# Patient Record
Sex: Male | Born: 1940 | Race: White | Hispanic: No | Marital: Single | Smoking: Former smoker
Health system: Southern US, Community
[De-identification: ages and names within clinical notes are randomized; demographics above are authoritative.]

## PROBLEM LIST (undated history)

## (undated) DIAGNOSIS — N209 Urinary calculus, unspecified: Secondary | ICD-10-CM

## (undated) DIAGNOSIS — I10 Essential (primary) hypertension: Secondary | ICD-10-CM

## (undated) HISTORY — DX: Essential (primary) hypertension: I10

---

## 2004-11-24 ENCOUNTER — Emergency Department (HOSPITAL_COMMUNITY): Admission: EM | Admit: 2004-11-24 | Discharge: 2004-11-24 | Payer: Self-pay | Admitting: Family Medicine

## 2016-04-12 ENCOUNTER — Ambulatory Visit (INDEPENDENT_AMBULATORY_CARE_PROVIDER_SITE_OTHER): Payer: Medicare Other | Admitting: Family Medicine

## 2016-04-12 ENCOUNTER — Observation Stay (HOSPITAL_COMMUNITY)
Admission: EM | Admit: 2016-04-12 | Discharge: 2016-04-13 | Disposition: A | Discharge status: Home / Self Care | Payer: Medicare Other | Attending: Internal Medicine | Admitting: Internal Medicine

## 2016-04-12 ENCOUNTER — Emergency Department (HOSPITAL_COMMUNITY): Payer: Medicare Other

## 2016-04-12 ENCOUNTER — Encounter (HOSPITAL_COMMUNITY): Payer: Self-pay

## 2016-04-12 VITALS — BP 182/84 | HR 92 | Temp 98.7°F | Resp 16 | Ht 68.0 in | Wt 206.0 lb

## 2016-04-12 DIAGNOSIS — Z23 Encounter for immunization: Secondary | ICD-10-CM | POA: Insufficient documentation

## 2016-04-12 DIAGNOSIS — R0789 Other chest pain: Secondary | ICD-10-CM | POA: Diagnosis not present

## 2016-04-12 DIAGNOSIS — R519 Headache, unspecified: Secondary | ICD-10-CM

## 2016-04-12 DIAGNOSIS — R739 Hyperglycemia, unspecified: Secondary | ICD-10-CM | POA: Diagnosis present

## 2016-04-12 DIAGNOSIS — R072 Precordial pain: Secondary | ICD-10-CM

## 2016-04-12 DIAGNOSIS — R42 Dizziness and giddiness: Secondary | ICD-10-CM

## 2016-04-12 DIAGNOSIS — R51 Headache: Secondary | ICD-10-CM

## 2016-04-12 DIAGNOSIS — Z87891 Personal history of nicotine dependence: Secondary | ICD-10-CM | POA: Insufficient documentation

## 2016-04-12 DIAGNOSIS — R1013 Epigastric pain: Secondary | ICD-10-CM | POA: Diagnosis present

## 2016-04-12 DIAGNOSIS — I10 Essential (primary) hypertension: Secondary | ICD-10-CM | POA: Diagnosis present

## 2016-04-12 DIAGNOSIS — E785 Hyperlipidemia, unspecified: Secondary | ICD-10-CM

## 2016-04-12 DIAGNOSIS — R079 Chest pain, unspecified: Principal | ICD-10-CM | POA: Diagnosis present

## 2016-04-12 HISTORY — DX: Urinary calculus, unspecified: N20.9

## 2016-04-12 LAB — CBC WITH DIFFERENTIAL/PLATELET
BASOS PCT: 1 %
Basophils Absolute: 0.1 10*3/uL (ref 0.0–0.1)
EOS ABS: 0.1 10*3/uL (ref 0.0–0.7)
Eosinophils Relative: 1 %
HCT: 47.6 % (ref 39.0–52.0)
HEMOGLOBIN: 16.1 g/dL (ref 13.0–17.0)
LYMPHS ABS: 1.4 10*3/uL (ref 0.7–4.0)
LYMPHS PCT: 14 %
MCH: 28.6 pg (ref 26.0–34.0)
MCHC: 33.8 g/dL (ref 30.0–36.0)
MCV: 84.7 fL (ref 78.0–100.0)
Monocytes Absolute: 0.5 10*3/uL (ref 0.1–1.0)
Monocytes Relative: 5 %
NEUTROS ABS: 8.2 10*3/uL — AB (ref 1.7–7.7)
Neutrophils Relative %: 79 %
Platelets: 230 10*3/uL (ref 150–400)
RBC: 5.62 MIL/uL (ref 4.22–5.81)
RDW: 14.1 % (ref 11.5–15.5)
WBC: 10.3 10*3/uL (ref 4.0–10.5)

## 2016-04-12 LAB — BASIC METABOLIC PANEL
Anion gap: 10 (ref 5–15)
BUN: 16 mg/dL (ref 6–20)
CHLORIDE: 107 mmol/L (ref 101–111)
CO2: 24 mmol/L (ref 22–32)
CREATININE: 1.06 mg/dL (ref 0.61–1.24)
Calcium: 9.7 mg/dL (ref 8.9–10.3)
GFR calc Af Amer: >60 mL/min (ref >60)
GFR calc non Af Amer: >60 mL/min (ref >60)
Glucose, Bld: 130 mg/dL — ABNORMAL HIGH (ref 65–99)
Potassium: 4.2 mmol/L (ref 3.5–5.1)
SODIUM: 141 mmol/L (ref 135–145)

## 2016-04-12 LAB — I-STAT TROPONIN, ED: TROPONIN I, POC: 0 ng/mL (ref 0.00–0.08)

## 2016-04-12 LAB — TROPONIN I: Troponin I: <0.03 ng/mL (ref <0.03)

## 2016-04-12 MED ORDER — INFLUENZA VAC SPLIT QUAD 0.5 ML IM SUSY
0.5000 mL | PREFILLED_SYRINGE | INTRAMUSCULAR | Status: AC
Start: 2016-04-13 — End: 2016-04-13
  Administered 2016-04-13: 0.5 mL via INTRAMUSCULAR
  Filled 2016-04-12: qty 0.5

## 2016-04-12 MED ORDER — ENOXAPARIN SODIUM 40 MG/0.4ML ~~LOC~~ SOLN
40.0000 mg | SUBCUTANEOUS | Status: DC
  Administered 2016-04-12: 40 mg via SUBCUTANEOUS
  Filled 2016-04-12: qty 0.4

## 2016-04-12 MED ORDER — ASPIRIN EC 81 MG PO TBEC
81.0000 mg | DELAYED_RELEASE_TABLET | Freq: Every day | ORAL | Status: DC
  Administered 2016-04-13: 81 mg via ORAL
  Filled 2016-04-12: qty 1

## 2016-04-12 MED ORDER — ACETAMINOPHEN 325 MG PO TABS: 650.0000 mg | ORAL_TABLET | Freq: Four times a day (QID) | ORAL | Status: DC | PRN

## 2016-04-12 MED ORDER — SODIUM CHLORIDE 0.9% FLUSH
3.0000 mL | Freq: Two times a day (BID) | INTRAVENOUS | Status: DC
  Administered 2016-04-12: 3 mL via INTRAVENOUS

## 2016-04-12 MED ORDER — ASPIRIN 81 MG PO CHEW: 324.0000 mg | CHEWABLE_TABLET | Freq: Once | ORAL | Status: DC

## 2016-04-12 MED ORDER — ONDANSETRON HCL 4 MG PO TABS
4.0000 mg | ORAL_TABLET | Freq: Four times a day (QID) | ORAL | Status: DC | PRN
Start: 2016-04-12 — End: 2016-04-13

## 2016-04-12 MED ORDER — PNEUMOCOCCAL VAC POLYVALENT 25 MCG/0.5ML IJ INJ
0.5000 mL | INJECTION | INTRAMUSCULAR | Status: AC
  Administered 2016-04-13: 0.5 mL via INTRAMUSCULAR
  Filled 2016-04-12: qty 0.5

## 2016-04-12 MED ORDER — PANTOPRAZOLE SODIUM 40 MG PO TBEC
40.0000 mg | DELAYED_RELEASE_TABLET | Freq: Every day | ORAL | Status: DC
  Administered 2016-04-12 – 2016-04-13 (×2): 40 mg via ORAL
  Filled 2016-04-12 (×2): qty 1

## 2016-04-12 MED ORDER — ONDANSETRON HCL 4 MG/2ML IJ SOLN: 4.0000 mg | Freq: Four times a day (QID) | INTRAMUSCULAR | Status: DC | PRN

## 2016-04-12 MED ORDER — LISINOPRIL 5 MG PO TABS
5.0000 mg | ORAL_TABLET | Freq: Every day | ORAL | Status: DC
  Administered 2016-04-12 – 2016-04-13 (×2): 5 mg via ORAL
  Filled 2016-04-12 (×2): qty 1

## 2016-04-12 NOTE — ED Triage Notes (Signed)
Pt presents to ed from urgent care for chest pain x 2 days radiating into his left arm that is worse when he sleeps on his left side, denies any other symptoms, patient denies pain at this time and recievied 324 of asa pta

## 2016-04-12 NOTE — Progress Notes (Addendum)
Subjective:    Patient ID: Gabriel Wu, male    DOB: [birth year], 75 y.o.   MRN: [id number] Chief Complaint  Patient presents with  . Dizziness  . Chest Pain    HPI Gabriel Wu is a 75 y.o. male I was pulled from patient care to see patient acutely due to presenting symptoms of chest pain and dizziness along with elevated blood pressure. Patient was brought back immediately to room 6  Patient presents with a left-sided chest pain for the past 3 days, with one dizzy spell this morning. Left-sided chest pain radiates to the shoulder and upper arm, lasts a few minutes at a time, waxing and waning for the past 3 days. No current pain now, but on and off for days. Pain is at 3 out of 10 intensity at its worst, currently 0 out of 10 as no current chest pain. His pain improves with rubbing over the muscle as it goes away within a few minutes. Denies any associated nausea/vomiting,  shortness of breath, or any diaphoresis. No known history of heart disease. Denies history of DVT/PE, denies history of calf pain, swelling, or recent air or car travel.  Also admits to dizziness this morning that was noted more with head movement or getting up and moving. Denies any syncope. Denies any focal weakness in arms legs face, denies slurred speech or facial droop.  Past medical history of kidney stone approximately 4 years ago and was advised that his blood pressure was borderline high at that time but no medications were given. He does not have a primary care provider and no recent medical care in the past 4 years.    Social history: Tobacco use approximately 50 years of cigar use, quit 5 years ago.  Review of Systems  Eyes: Negative for visual disturbance.  Respiratory: Negative for cough and shortness of breath.   Cardiovascular: Positive for chest pain. Negative for leg swelling.  Gastrointestinal: Negative for nausea and vomiting.  Musculoskeletal: Positive for arthralgias (l shoulder  to upper arm from chest. ). Negative for joint swelling.  Skin: Negative for rash.  Neurological: Positive for dizziness and headaches (Slight right-sided occipital headache today.). Negative for syncope, facial asymmetry and weakness.       Objective:   Physical Exam  Constitutional: He is oriented to person, place, and time. He appears well-developed and well-nourished.  HENT:  Head: Normocephalic and atraumatic.  Eyes: EOM are normal. Pupils are equal, round, and reactive to light.  Neck: No JVD present. Carotid bruit is not present.  Cardiovascular: Normal rate, regular rhythm and normal heart sounds.   No murmur heard. Pulmonary/Chest: Effort normal and breath sounds normal. He has no rales.  Unable to reproduce pain with palpation along chest wall or shoulder.  Musculoskeletal: He exhibits no edema.       Right lower leg: He exhibits no tenderness and no swelling.       Left lower leg: He exhibits no tenderness and no swelling.  Slight discomfort at the dorsum of his left shoulder with shoulder abduction, but does not reproduce chest pain or deeper pain he has been experiencing the past few days.   Neurological: He is alert and oriented to person, place, and time.  No focal weakness appreciated. Speaking in normal sentences. Able to repeat phrase "you can't teach an old dog new tricks" without difficulty. No pronator drift, negative Romberg.  Skin: Skin is warm and dry.  Psychiatric: He has a normal mood and affect. His  behavior is normal.  Vitals reviewed.  Vitals:   04/12/16 1433 04/12/16 1442  BP: (!) 180/104 (!) 182/84  Pulse: 92   Resp: 16   Temp: 98.7 F (37.1 C)   Weight: 206 lb (93.4 kg)   Height: 5\' 8"  (1.727 m)    EKG: sinus rhythm, T-wave inversion in leads 3 and aVF. No acute findings otherwise, no prior EKG available for comparison.  2:44 PM IV placed 20-gauge left forearm running normal saline. EMS called for transport. Placed on cardiac monitor.   3:06  PM [facility]. Report given to EMS with transfer of care.     Assessment & Plan:  Gabriel Wu is a 75 y.o. male Precordial pain - Plan: EKG 12-Lead  Chest pain, unspecified chest pain type  Dizziness  Occipital headache  Three-day history of left-sided chest pain with radiation to left shoulder and upper arm. Occipital headache this morning, as well as episode of dizziness this morning without syncope. No other focal neurologic deficits or symptoms. Some persistent occipital headache on the right today.  -  Denies any chest pain in office, borderline abnormal EKG, but with elevated blood pressure and description of symptoms, concerning for possible cardiac source/unstable angina.   -IV placed for access, cardiac monitor placed. Aspirin was not given due to occipital headache, hypertension, and dizziness earlier today - less likely, but hemorrhagic CVA in differential with HA and HTN. EMS called for transport with [facility]. Timing of interventions as above.     No orders of the defined types were placed in this encounter.  Patient Instructions   If follow-up after emergency room visit, return here or other medical provider.      IF you received an x-ray today, you will receive an invoice from [facility]. Please contact [facility] at [phone] with questions or concerns regarding your invoice.   IF you received labwork today, you will receive an invoice from [facility]. Please contact [facility] at [phone] with questions or concerns regarding your invoice.   Our billing staff will not be able to assist you with questions regarding bills from these companies.  You will be contacted with the lab results as soon as they are available. The fastest way to get your results is to activate your My Chart account. Instructions are located on the last page of  this paperwork. If you have not heard from us regarding the results in 2 weeks, please contact this office.       Signed,   Meredith StaggersJeffrey Levi Crass, MD [facility].  04/13/16 11:16 PM

## 2016-04-12 NOTE — ED Provider Notes (Signed)
[facility]-EMERGENCY DEPT Provider Note   CSN: [id number] Arrival date & time: 04/12/16  1552     History   Chief Complaint Chief Complaint  Patient presents with  . Chest Pain    HPI Gabriel Wu is a 75 y.o. male.  HPI  [id number] year old male who presents with chest pain. He has no known medical history as he has not seen a primary care doctor for several years. States onset of intermittent chest pain over the past 3 days. Prior to this he has not had prior history of chest pain or cardiac workup. States that pain typically comes on in the morning when he wakes up from sleep. It lasts for 10 minutes before self resolving. He describes it over the left side of his chest and left shoulder that is nonradiating, aching and throbbing in nature. 3/10 in severity at the worst. No alleviating or aggravating factors. It is not reproducible with palpation or range of motion of the shoulder. No falls, exertional activity recently. Pain not worsened by activity. He does not have associating leg swelling or leg pain, syncope, back pain, nausea or vomiting, abdominal pain, fevers or chills, cough, difficulty breathing, or diaphoresis. This morning he felt like his blood pressure may been elevated as with bending over and standing up he felt very dizzy. He was seen in the doctor's office and noted to have elevated blood pressure and sent to the ED for evaluation. Currently he denies any chest pain. Denies tobacco use. Father died of MI in his 240s.  History reviewed. No pertinent past medical history.  There are no active problems to display for this patient.   History reviewed. No pertinent surgical history.  OB History    No data available       Home Medications    Prior to Admission medications   Not on File    Family History No family history on file. Father died of MI in his 5440s.   Social History Social History  Substance Use Topics  . Smoking status: Former Smoker    Years: 50.00      Types: Cigars  . Smokeless tobacco: Never Used  . Alcohol use Not on file     Allergies   Review of patient's allergies indicates no known allergies.   Review of Systems Review of Systems 10/14 systems reviewed and are negative other than those stated in the HPI   Physical Exam Updated Vital Signs BP 180/81   Pulse 75   Temp 98.2 F (36.8 C) (Oral)   Resp 19   Ht 5\' 9"  (1.753 m)   Wt 195 lb (88.5 kg)   SpO2 97%   BMI 28.80 kg/m   Physical Exam Physical Exam  Nursing note and vitals reviewed. Constitutional: Well developed, well nourished, non-toxic, and in no acute distress Head: Normocephalic and atraumatic.  Mouth/Throat: Oropharynx is clear and moist.  Neck: Normal range of motion. Neck supple.  Eyes: conjunctiva normal Cardiovascular: Normal rate and regular rhythm.  no edema Pulmonary/Chest: Effort normal and breath sounds normal. No chest wall tenderness.  Abdominal: Soft. There is no tenderness. There is no rebound and no guarding.  Musculoskeletal: Normal range of motion.  Neurological: Alert, no facial droop, fluent speech, moves all extremities symmetrically Skin: Skin is warm and dry.  Psychiatric: Cooperative   ED Treatments / Results  Labs (all labs ordered are listed, but only abnormal results are displayed) Labs Reviewed  CBC WITH DIFFERENTIAL/PLATELET - Abnormal; Notable for the following:  Result Value   Neutro Abs 8.2 (*)    All other components within normal limits  BASIC METABOLIC PANEL - Abnormal; Notable for the following:    Glucose, Bld 130 (*)    All other components within normal limits  I-STAT TROPOININ, ED    EKG  EKG Interpretation  Date/Time:  Saturday April 12 2016 15:53:39 EDT Ventricular Rate:  76 PR Interval:    QRS Duration: 93 QT Interval:  379 QTC Calculation: 427 R Axis:   39 Text Interpretation:  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 EXAM: CHEST  2 VIEW COMPARISON:  None. FINDINGS: Lateral view degraded by patient arm position. Midline trachea. Normal heart size and mediastinal contours. No pleural effusion or pneumothorax. Clear lungs. IMPRESSION: No acute cardiopulmonary disease. Electronically Signed   By: Jeronimo Greaves M.D.   On: 04/12/2016 18:27    Procedures Procedures (including critical care time)  Medications Ordered in ED Medications  aspirin chewable tablet 324 mg (324 mg Oral Not Given 04/12/16 1638)     Initial Impression / Assessment and Plan / ED Course  I have reviewed the triage vital signs and the nursing notes.  Pertinent labs & imaging results that were available during my care of the patient were reviewed by me and considered in my medical decision making (see chart for details).  Clinical Course    75 year old male who presents with 3 days of intermittent left shoulder and left chest pain that is not reproducible. Borderline low to medium risk, with likely hypertension and then history of age, obesity, and family history as risk factors. Has not had a primary care follow-up for very long time so other risk factors are unknown at this time. He has an chest pain-free here in the emergency department. Chest x-ray without acute cardiopulmonary processes. Troponin 1 is negative and EKG without acute ischemic changes. Still concern for potential ACS. History and presentation unlikely to be PE, dissection, GI cause, or other cardiopulmonary or intrathoracic etiology. We'll plan to admit for observation for chest pain rule out.  Final Clinical Impressions(s) / ED Diagnoses   Final diagnoses:  Nonspecific chest pain    New Prescriptions New Prescriptions   No medications on file     Lavera Guise,  MD 04/12/16 [place]

## 2016-04-12 NOTE — H&P (Signed)
History and Physical    Gabriel Wu RUE:[id number] DOB: 12-Sep-1940 DOA: 04/12/2016  PCP: No primary care provider on file.   [facility]: [facility]  Chief Complaint: Chest pain.  HPI: Gabriel Wu is a 75 y.o. male with medical history significant of urolithiasis was coming to the hospital from [facility] complaining of chest pain since yesterday.  Per patient, he developed a throbbing left sided CP, radiated to his left shoulder associated with dizziness. He denies dyspnea, palpitations, nausea, emesis, PND, orthopnea or pitting edema. He states that in one of the episodes he went outside the house and the pain felt better. He says that the pain has happened on/off more than five times since yesterday.  He went to the urgent care facility this morning since the pain has persisted and was referred to the ED. He denies CP at this time.   ED Course: The patient received aspirin. EKG is sinus rhythm. First troponin level was negative. He is CP free now.  Review of Systems: As per HPI otherwise 10 point review of systems negative.    Past Medical History:  Diagnosis Date  . Urolithiasis    Past stone spontaneously per patient.    History reviewed. No pertinent surgical history.   reports that he quit smoking about 5 years ago. His smoking use included Cigars. He quit after 50.00 years of use. He has never used smokeless tobacco. He reports that he does not drink alcohol or use drugs.  No Known Allergies  Family History  Problem Relation Age of Onset  . Leukemia Mother   . Heart attack Father   . Stomach cancer Sister     Prior to Admission medications   Medication Sig Start Date End Date Taking? Authorizing Provider  aspirin 325 MG tablet Take 325 mg by mouth every 6 (six) hours as needed.   Yes Historical Provider, MD  omeprazole (PRILOSEC OTC) 20 MG tablet Take 20 mg by mouth every other day.   Yes Historical  Provider, MD    Physical Exam:  Constitutional: NAD, calm, comfortable Vitals:   04/12/16 1700 04/12/16 1715 04/12/16 1743 04/12/16 1800  BP: 161/86 170/87 180/81 163/85  Pulse: 68 72 75 70  Resp: 16 13 19 13   Temp:      TempSrc:      SpO2: 98% 99% 97% 98%  Weight:      Height:       Eyes: PERRL, lids and conjunctivae normal ENMT: Mucous membranes are moist. Posterior pharynx clear of any exudate or lesions. Dentures.  Neck: normal, supple, no masses, no thyromegaly Respiratory: clear to auscultation bilaterally, no wheezing, no crackles. Normal respiratory effort. No accessory muscle use.  Cardiovascular: Regular rate and rhythm, no murmurs / rubs / gallops. No extremity edema. 2+ pedal pulses. No carotid bruits.  Abdomen: Bowel sounds positive. Soft, no tenderness, no masses palpated. No hepatosplenomegaly.  Musculoskeletal: no clubbing / cyanosis. No joint deformity upper and lower extremities. Good ROM, no contractures. Normal muscle tone.  Skin: no rashes, lesions, ulcers. No induration Neurologic: CN 2-12 grossly intact. Sensation intact, DTR normal. Strength 5/5 in all 4.  Psychiatric: Normal judgment and insight. Alert and oriented x 4. Normal mood.    Labs on Admission: I have personally reviewed following labs and imaging studies  CBC:  Recent Labs Lab 04/12/16 1639  WBC 10.3  NEUTROABS 8.2*  HGB 16.1  HCT 47.6  MCV 84.7  PLT 230  Basic Metabolic Panel:  Recent Labs Lab 04/12/16 1639  NA 141  K 4.2  CL 107  CO2 24  GLUCOSE 130*  BUN 16  CREATININE 1.06  CALCIUM 9.7   GFR: Estimated Creatinine Clearance (by C-G formula based on SCr of 1.06 mg/dL) Male: 81.155.2 mL/min Male: 67.3 mL/min Liver Function Tests: No results for input(s): AST, ALT, ALKPHOS, BILITOT, PROT, ALBUMIN in the last 168 hours. No results for input(s): LIPASE, AMYLASE in the last 168 hours. No results for input(s): AMMONIA in the last 168 hours. Coagulation Profile: No  results for input(s): INR, PROTIME in the last 168 hours. Cardiac Enzymes: No results for input(s): CKTOTAL, CKMB, CKMBINDEX, TROPONINI in the last 168 hours. BNP (last 3 results) No results for input(s): PROBNP in the last 8760 hours. HbA1C: No results for input(s): HGBA1C in the last 72 hours. CBG: No results for input(s): GLUCAP in the last 168 hours. Lipid Profile: No results for input(s): CHOL, HDL, LDLCALC, TRIG, CHOLHDL, LDLDIRECT in the last 72 hours. Thyroid Function Tests: No results for input(s): TSH, T4TOTAL, FREET4, T3FREE, THYROIDAB in the last 72 hours. Anemia Panel: No results for input(s): VITAMINB12, FOLATE, FERRITIN, TIBC, IRON, RETICCTPCT in the last 72 hours. Urine analysis: No results found for: COLORURINE, APPEARANCEUR, LABSPEC, PHURINE, GLUCOSEU, HGBUR, BILIRUBINUR, KETONESUR, PROTEINUR, UROBILINOGEN, NITRITE, LEUKOCYTESUR  Radiological Exams on Admission: Dg Chest 2 View  Result Date: 04/12/2016 CLINICAL DATA:  Chest pain, left shoulder pain, and dizziness for the past few days but worse today. EXAM: CHEST  2 VIEW COMPARISON:  None. FINDINGS: Lateral view degraded by patient arm position. Midline trachea. Normal heart size and mediastinal contours. No pleural effusion or pneumothorax. Clear lungs. IMPRESSION: No acute cardiopulmonary disease. Electronically Signed   By: Jeronimo GreavesKyle  Talbot M.D.   On: 04/12/2016 18:27    EKG: Independently reviewed. Vent. rate 76 BPM PR interval * ms QRS duration 93 ms QT/QTc 379/427 ms P-R-T axes * 39 -3 Sinus rhythm.  Assessment/Plan Principal Problem:   Chest pain Admit to telemetry unit/observation. Cardiac monitoring. Supplemental oxygen as needed. Continue aspirin. Check fasting lipids and glucose in a.m. Check echocardiogram in the morning.  Active Problems:   Elevated blood pressure Will continue to observe, but will likely need antihypertensive therapy. Addendun: Started lisinopril 5 mg po QD.  Follow up BP,  BUN/creatinine and electrolytes. Needs to establish with PCP.    Hyperglycemia No polyuria, no polydipsia or position changes per patient. Check fasting glucose in the morning.    DVT prophylaxis: Lovenox SQ. Code Status: Full code. Family Communication: His son Gabriel Wu was present in the room. Disposition Plan: Admit for overnight observation/chest pain. Consults called:  Admission status: Observation/telemetry.   Bobette Moavid Manuel Deaven Barron MD [facility] Pager [phone].  If 7PM-7AM, please contact night-coverage [email or web address] Password [email or web address]  04/12/2016, 7:58 PM

## 2016-04-12 NOTE — Patient Instructions (Addendum)
If follow-up after emergency room visit, return here or other medical provider.      IF you received an x-ray today, you will receive an invoice from [facility]. Please contact [facility] at [phone] with questions or concerns regarding your invoice.   IF you received labwork today, you will receive an invoice from [facility]. Please contact [facility] at [phone] with questions or concerns regarding your invoice.   Our billing staff will not be able to assist you with questions regarding bills from these companies.  You will be contacted with the lab results as soon as they are available. The fastest way to get your results is to activate your My Chart account. Instructions are located on the last page of this paperwork. If you have not heard from us regarding the results in 2 weeks, please contact this office.

## 2016-04-13 ENCOUNTER — Observation Stay (HOSPITAL_COMMUNITY): Payer: Medicare Other

## 2016-04-13 ENCOUNTER — Observation Stay (HOSPITAL_BASED_OUTPATIENT_CLINIC_OR_DEPARTMENT_OTHER): Payer: Medicare Other

## 2016-04-13 DIAGNOSIS — R079 Chest pain, unspecified: Secondary | ICD-10-CM

## 2016-04-13 DIAGNOSIS — R0789 Other chest pain: Secondary | ICD-10-CM | POA: Diagnosis not present

## 2016-04-13 DIAGNOSIS — E785 Hyperlipidemia, unspecified: Secondary | ICD-10-CM | POA: Diagnosis not present

## 2016-04-13 DIAGNOSIS — R030 Elevated blood-pressure reading, without diagnosis of hypertension: Secondary | ICD-10-CM

## 2016-04-13 LAB — COMPREHENSIVE METABOLIC PANEL
ALT: 18 U/L (ref 17–63)
ANION GAP: 8 (ref 5–15)
AST: 18 U/L (ref 15–41)
Albumin: 3.7 g/dL (ref 3.5–5.0)
Alkaline Phosphatase: 52 U/L (ref 38–126)
BUN: 17 mg/dL (ref 6–20)
CHLORIDE: 107 mmol/L (ref 101–111)
CO2: 25 mmol/L (ref 22–32)
Calcium: 9.3 mg/dL (ref 8.9–10.3)
Creatinine, Ser: 1.01 mg/dL (ref 0.61–1.24)
GFR calc non Af Amer: >60 mL/min (ref >60)
Glucose, Bld: 138 mg/dL — ABNORMAL HIGH (ref 65–99)
POTASSIUM: 3.6 mmol/L (ref 3.5–5.1)
SODIUM: 140 mmol/L (ref 135–145)
Total Bilirubin: 0.5 mg/dL (ref 0.3–1.2)
Total Protein: 6.8 g/dL (ref 6.5–8.1)

## 2016-04-13 LAB — LIPID PANEL
CHOL/HDL RATIO: 4.4 ratio
CHOLESTEROL: 160 mg/dL (ref 0–200)
HDL: 36 mg/dL — AB (ref >40)
LDL Cholesterol: 91 mg/dL (ref 0–99)
TRIGLYCERIDES: 165 mg/dL — AB (ref <150)
VLDL: 33 mg/dL (ref 0–40)

## 2016-04-13 LAB — NM MYOCAR MULTI W/SPECT W/WALL MOTION / EF
CHL CUP MPHR: 146 {beats}/min
CSEPED: 0 min
CSEPEW: 1 METS
Exercise duration (sec): 0 s
Peak HR: 96 {beats}/min
Percent HR: 65 %
Rest HR: 71 {beats}/min

## 2016-04-13 LAB — TROPONIN I

## 2016-04-13 MED ORDER — REGADENOSON 0.4 MG/5ML IV SOLN
0.4000 mg | Freq: Once | INTRAVENOUS | Status: AC
  Administered 2016-04-13: 0.4 mg via INTRAVENOUS
  Filled 2016-04-13: qty 5

## 2016-04-13 MED ORDER — LISINOPRIL 5 MG PO TABS: 5.0000 mg | ORAL_TABLET | Freq: Every day | ORAL | 0 refills | Status: DC

## 2016-04-13 MED ORDER — TECHNETIUM TC 99M TETROFOSMIN IV KIT
30.0000 | PACK | Freq: Once | INTRAVENOUS | Status: AC | PRN
  Administered 2016-04-13: 30 via INTRAVENOUS

## 2016-04-13 MED ORDER — ASPIRIN 81 MG PO TBEC: 81.0000 mg | DELAYED_RELEASE_TABLET | Freq: Every day | ORAL | Status: DC

## 2016-04-13 MED ORDER — ATORVASTATIN CALCIUM 40 MG PO TABS
40.0000 mg | ORAL_TABLET | Freq: Every day | ORAL | 0 refills | Status: DC
Start: 2016-04-13 — End: 2016-05-12

## 2016-04-13 MED ORDER — ATORVASTATIN CALCIUM 40 MG PO TABS: 40.0000 mg | ORAL_TABLET | Freq: Every day | ORAL | Status: DC

## 2016-04-13 MED ORDER — TECHNETIUM TC 99M TETROFOSMIN IV KIT
10.0000 | PACK | Freq: Once | INTRAVENOUS | Status: AC | PRN
  Administered 2016-04-13: 10 via INTRAVENOUS

## 2016-04-13 MED ORDER — REGADENOSON 0.4 MG/5ML IV SOLN
INTRAVENOUS | Status: AC
  Filled 2016-04-13: qty 5

## 2016-04-13 NOTE — Progress Notes (Addendum)
  1-day Nuclear Stress Test performed, images and final report pending. Being read by [facility].  Signed, Ellsworth LennoxBrittany M Latoya Maulding, PA-C 04/13/2016, 11:27 AM Pager: [phone]   Patient's NST showed:  IMPRESSION: 1. No reversible ischemia or infarction.  2. Normal left ventricular wall motion.  3. Left ventricular ejection fraction 65%  4. Non invasive risk stratification*: Low  Patient and admitting team are aware of results. No further inpatient cardiac workup indicated at this time.  Signed, Ellsworth LennoxBrittany M Xandra Laramee, PA-C 04/13/2016, 2:39 PM Pager: [phone]

## 2016-04-13 NOTE — Consult Note (Signed)
Cardiology Consult    Patient ID: Gabriel Wu MRN: [id number], DOB/AGE: 1941-03-13   Admit date: 04/12/2016 Date of Consult: 04/13/2016  Primary Physician: No primary care provider on file. Reason for Consult: Chest pain  Primary Cardiologist: New Requesting Provider: Dr. Rennis PettyGhirmire  Patient Profile  Gabriel Wu is a 75 year old male with no significant cardiac history. He presented to the ED on 04/12/16 with chest pain.  History of Present Illness  Gabriel Wu was sitting in his home on Thursday and developed substernal left sided chest pain. The pain radiated to his left shoulder. He denies associated symptoms. No SOB, nausea or diaphoresis. He says that the pain got better when he rubbed his chest.  On Saturday morning 04/12/16 he developed the same chest pain that was associated with some diaphoresis. The pain was slightly more severe this time. He has never had any pain like this before. He went to urgent care and then was [facility].   He is a retired Personnel officerelectrician. He is still moderately active at home and mows his grass. He denies chest pain with any activity outside.   He does have a family history of MI, his father died of MI at the age of 75. His EKG shows some T wave inversion in his inferior leads. No prior EKG to compare to.   Past Medical History   Past Medical History:  Diagnosis Date  . Urolithiasis    Past stone spontaneously per patient.    History reviewed. No pertinent surgical history.   Allergies  No Known Allergies  Inpatient Medications    . aspirin  324 mg Oral Once  . aspirin EC  81 mg Oral Daily  . enoxaparin (LOVENOX) injection  40 mg Subcutaneous Q24H  . Influenza vac split quadrivalent PF  0.5 mL Intramuscular Tomorrow-1000  . lisinopril  5 mg Oral Daily  . pantoprazole  40 mg Oral Daily  . pneumococcal 23 valent vaccine  0.5 mL Intramuscular Tomorrow-1000  . sodium chloride flush  3 mL Intravenous Q12H    Family  History    Family History  Problem Relation Age of Onset  . Leukemia Mother   . Heart attack Father   . Stomach cancer Sister     Social History    Social History   Social History  . Marital status: Single    Spouse name: N/A  . Number of children: N/A  . Years of education: N/A   Occupational History  . Not on file.   Social History Main Topics  . Smoking status: Former Smoker    Years: 50.00    Types: Cigars    Quit date: 07/21/2010  . Smokeless tobacco: Never Used  . Alcohol use No  . Drug use: No  . Sexual activity: Not on file   Other Topics Concern  . Not on file   Social History Narrative  . No narrative on file     Review of Systems    General:  No chills, fever, night sweats or weight changes.  Cardiovascular:  + chest pain, dyspnea on exertion, edema, orthopnea, palpitations, paroxysmal nocturnal dyspnea. Dermatological: No rash, lesions/masses Respiratory: No cough, dyspnea Urologic: No hematuria, dysuria Abdominal:   No nausea, vomiting, diarrhea, bright red blood per rectum, melena, or hematemesis Neurologic:  No visual changes, wkns, changes in mental status. All other systems reviewed and are otherwise negative except as noted above.  Physical Exam    Blood pressure 127/78, pulse Marland Kitchen(!)  57, temperature 98.4 F (36.9 C), temperature source Oral, resp. rate 16, height 5\' 9"  (1.753 m), weight 207 lb 11.2 oz (94.2 kg), SpO2 98 %.  General: Pleasant, NAD Psych: Normal affect. Neuro: Alert and oriented X 3. Moves all extremities spontaneously. HEENT: Normal  Neck: Supple without bruits or JVD. Lungs:  Resp regular and unlabored, CTA. Heart: RRR no s3, s4, or murmurs. Abdomen: Soft, non-tender, non-distended, BS + x 4.  Extremities: No clubbing, cyanosis or edema. DP/PT/Radials 2+ and equal bilaterally.  Labs    Troponin [facility] of Care Test)  Recent Labs  04/12/16 1646  TROPIPOC 0.00    Recent Labs  04/12/16 2310 04/13/16 0438    TROPONINI <0.03 <0.03   Lab Results  Component Value Date   WBC 10.3 04/12/2016   HGB 16.1 04/12/2016   HCT 47.6 04/12/2016   MCV 84.7 04/12/2016   PLT 230 04/12/2016    Recent Labs Lab 04/13/16 0438  NA 140  K 3.6  CL 107  CO2 25  BUN 17  CREATININE 1.01  CALCIUM 9.3  PROT 6.8  BILITOT 0.5  ALKPHOS 52  ALT 18  AST 18  GLUCOSE 138*   Lab Results  Component Value Date   CHOL 160 04/13/2016   HDL 36 (L) 04/13/2016   LDLCALC 91 04/13/2016   TRIG 165 (H) 04/13/2016   No results found for: [facility]   Radiology Studies    Dg Chest 2 View  Result Date: 04/12/2016 CLINICAL DATA:  Chest pain, left shoulder pain, and dizziness for the past few days but worse today. EXAM: CHEST  2 VIEW COMPARISON:  None. FINDINGS: Lateral view degraded by patient arm position. Midline trachea. Normal heart size and mediastinal contours. No pleural effusion or pneumothorax. Clear lungs. IMPRESSION: No acute cardiopulmonary disease. Electronically Signed   By: Jeronimo Greaves M.D.   On: 04/12/2016 18:27    EKG & Cardiac Imaging    EKG: NSR, some non specific ST abnormalities, T wave inversion in inferior leads.     Assessment & Plan  1. Chest pain: Presents with chest pain that occurred at rest. He is a former smoker, quit about 5 years ago. He does not have a history of MI, but with family history.   Would do Lexiscan Myoview today to rule out ischemia. Has some EKG changes with some ST changes in inferior leads. Troponin negative x 3.   2. HLD: LDL is 91. Would put on moderate to high intensity statin. ASCVD risk is 36.5. Atorvastatin 40mg .   3. HTN: Hypertensive. Placed on lisinopril 5mg  by primary team. Will likely need to titrate up.   Signed, Little Ishikawa, NP 04/13/2016, 8:55 AM Pager: [phone]

## 2016-04-13 NOTE — Discharge Instructions (Signed)

## 2016-04-13 NOTE — Discharge Summary (Signed)
PATIENT DETAILS Name: Gabriel Wu Age: 75 y.o. Sex: male Date of Birth: [birth year] MRN: [id number]. Admitting Physician: Bobette Mo, MD PCP:No primary care provider on file.  Admit Date: 04/12/2016 Discharge date: 04/13/2016  Recommendations for Outpatient Follow-up:  1. Follow up with PCP in 1-2 weeks 2. Please obtain BMP/CBC in one week 3. Repeat Lipid panel in 3 months 4. New meds: Lipitor, ASA and Lisinopril  Admitted From:  Home  Disposition: Home   Home Health: No  Equipment/Devices: None  Discharge Condition: Stable  CODE STATUS: FULL CODE  Diet recommendation:  Heart Healthy  Brief Summary: See H&P, Labs, Consult and Test reports for all details in brief, patient was admitted for evaluation of chest pain  Brief Hospital Course: Chest Pain: with both typical and atypical features. No further chest pain since admission. Cardiac enzymes negative. Seen bby cardiology and underwent nuclear stress test-which was negative for ischemia. Will place on ASA, Statin and Lisinopril as advised by cards. Stable for discharge  Dyslipidemia: started on Lipitor-repeat Lipid panel in 3 months.   HTN: BP elevated this admit-started on Lisinopril. Stable for further optimization to be done in the outpatient setting.   Procedures/Studies: Nuc Stress Test 9/24  Discharge Diagnoses:  Principal Problem:   Chest pain Active Problems:   Elevated blood pressure   Hyperglycemia   Dyspepsia   Hyperlipemia   Discharge Instructions:  Activity:  As tolerated with Full fall precautions use walker/cane & assistance as needed  Discharge Instructions    Call MD for:  persistant nausea and vomiting    Complete by:  As directed    Call MD for:  severe uncontrolled pain    Complete by:  As directed    Diet - low sodium heart healthy    Complete by:  As directed    Discharge instructions    Complete by:  As directed    Follow with Primary MD  No primary care  provider on file.  and other consultant's as instructed your Hospitalist MD  Please get a complete blood count and chemistry panel checked by your Primary MD at your next visit, and again as instructed by your Primary MD.  Get Medicines reviewed and adjusted: Please take all your medications with you for your next visit with your Primary MD  Laboratory/radiological data: Please request your Primary MD to go over all hospital tests and procedure/radiological results at the follow up, please ask your Primary MD to get all Hospital records sent to his/her office.  In some cases, they will be blood work, cultures and biopsy results pending at the time of your discharge. Please request that your primary care M.D. follows up on these results.  Also Note the following: If you experience worsening of your admission symptoms, develop shortness of breath, life threatening emergency, suicidal or homicidal thoughts you must seek medical attention immediately by calling 911 or calling your MD immediately  if symptoms less severe.  You must read complete instructions/literature along with all the possible adverse reactions/side effects for all the Medicines you take and that have been prescribed to you. Take any new Medicines after you have completely understood and accpet all the possible adverse reactions/side effects.   Do not drive when taking Pain medications or sleeping medications (Benzodaizepines)  Do not take more than prescribed Pain, Sleep and Anxiety Medications. It is not advisable to combine anxiety,sleep and pain medications without talking with your primary care practitioner  Special Instructions: If you have smoked or  chewed Tobacco  in the last 2 yrs please stop smoking, stop any regular Alcohol  and or any Recreational drug use.  Wear Seat belts while driving.  Please note: You were cared for by a hospitalist during your hospital stay. Once you are discharged, your primary care  physician will handle any further medical issues. Please note that NO REFILLS for any discharge medications will be authorized once you are discharged, as it is imperative that you return to your primary care physician (or establish a relationship with a primary care physician if you do not have one) for your post hospital discharge needs so that they can reassess your need for medications and monitor your lab values.   Increase activity slowly    Complete by:  As directed        Medication List    STOP taking these medications   aspirin 325 MG tablet Replaced by:  aspirin 81 MG EC tablet     TAKE these medications   aspirin 81 MG EC tablet Take 1 tablet (81 mg total) by mouth daily. Start taking on:  04/14/2016 Replaces:  aspirin 325 MG tablet   atorvastatin 40 MG tablet Commonly known as:  LIPITOR Take 1 tablet (40 mg total) by mouth daily at 6 PM.   lisinopril 5 MG tablet Commonly known as:  PRINIVIL,ZESTRIL Take 1 tablet (5 mg total) by mouth daily. Start taking on:  04/14/2016   omeprazole 20 MG tablet Commonly known as:  PRILOSEC OTC Take 20 mg by mouth every other day.      Follow-up Information    Primary Care Practitioner. Schedule an appointment as soon as possible for a visit in 2 week(s).          No Known Allergies  Consultations:   cardiology   Other Procedures/Studies: Dg Chest 2 View  Result Date: 04/12/2016 CLINICAL DATA:  Chest pain, left shoulder pain, and dizziness for the past few days but worse today. EXAM: CHEST  2 VIEW COMPARISON:  None. FINDINGS: Lateral view degraded by patient arm position. Midline trachea. Normal heart size and mediastinal contours. No pleural effusion or pneumothorax. Clear lungs. IMPRESSION: No acute cardiopulmonary disease. Electronically Signed   By: Jeronimo Greaves M.D.   On: 04/12/2016 18:27   Nm Myocar Multi W/spect W/wall Motion / Ef  Result Date: 04/13/2016 CLINICAL DATA:  Chest pain EXAM: MYOCARDIAL IMAGING WITH  SPECT (REST AND PHARMACOLOGIC-STRESS) GATED LEFT VENTRICULAR WALL MOTION STUDY LEFT VENTRICULAR EJECTION FRACTION TECHNIQUE: Standard myocardial SPECT imaging was performed after resting intravenous injection of 10 mCi Tc-27m tetrofosmin. Subsequently, intravenous infusion of Lexiscan was performed under the supervision of the Cardiology staff. At peak effect of the drug, 30 mCi Tc-24m tetrofosmin was injected intravenously and standard myocardial SPECT imaging was performed. Quantitative gated imaging was also performed to evaluate left ventricular wall motion, and estimate left ventricular ejection fraction. COMPARISON:  None. FINDINGS: Perfusion: No decreased activity in the left ventricle on stress imaging to suggest reversible ischemia or infarction. Wall Motion: Normal left ventricular wall motion. No left ventricular dilation. Left Ventricular Ejection Fraction: 65 % End diastolic volume 77 ml End systolic volume 27 ml IMPRESSION: 1. No reversible ischemia or infarction. 2. Normal left ventricular wall motion. 3. Left ventricular ejection fraction 65% 4. Non invasive risk stratification*: Low *2012 Appropriate Use Criteria for Coronary Revascularization Focused Update: J Am Coll Cardiol. 2012;59(9):857-881. http://content.dementiazones.com.aspx?articleid=[email or web address] Electronically Signed   By: Charline Bills M.D.   On: 04/13/2016 13:14  TODAY-DAY OF DISCHARGE:  Subjective:   Gabriel Wu today has no headache,no chest abdominal pain,no new weakness tingling or numbness, feels much better wants to go home today.   Objective:   Blood pressure (!) 157/68, pulse 84, temperature 98.4 F (36.9 C), temperature source Oral, resp. rate 16, height 5\' 9"  (1.753 m), weight 94.2 kg (207 lb 11.2 oz), SpO2 98 %.  Intake/Output Summary (Last 24 hours) at 04/13/16 1326 Last data filed at 04/13/16 0500  Gross per 24 hour  Intake                0 ml  Output              400 ml  Net              -400 ml   Filed Weights   04/12/16 1601 04/12/16 2100  Weight: 88.5 kg (195 lb) 94.2 kg (207 lb 11.2 oz)    Exam: Awake Alert, Oriented *3, No new F.N deficits, Normal affect [place].AT,PERRAL Supple Neck,No JVD, No cervical lymphadenopathy appriciated.  Symmetrical Chest wall movement, Good air movement bilaterally, CTAB RRR,No Gallops,Rubs or new Murmurs, No Parasternal Heave +ve B.Sounds, Abd Soft, Non tender, No organomegaly appriciated, No rebound -guarding or rigidity. No Cyanosis, Clubbing or edema, No new Rash or bruise   PERTINENT RADIOLOGIC STUDIES: Dg Chest 2 View  Result Date: 04/12/2016 CLINICAL DATA:  Chest pain, left shoulder pain, and dizziness for the past few days but worse today. EXAM: CHEST  2 VIEW COMPARISON:  None. FINDINGS: Lateral view degraded by patient arm position. Midline trachea. Normal heart size and mediastinal contours. No pleural effusion or pneumothorax. Clear lungs. IMPRESSION: No acute cardiopulmonary disease. Electronically Signed   By: Jeronimo Greaves M.D.   On: 04/12/2016 18:27   Nm Myocar Multi W/spect W/wall Motion / Ef  Result Date: 04/13/2016 CLINICAL DATA:  Chest pain EXAM: MYOCARDIAL IMAGING WITH SPECT (REST AND PHARMACOLOGIC-STRESS) GATED LEFT VENTRICULAR WALL MOTION STUDY LEFT VENTRICULAR EJECTION FRACTION TECHNIQUE: Standard myocardial SPECT imaging was performed after resting intravenous injection of 10 mCi Tc-15m tetrofosmin. Subsequently, intravenous infusion of Lexiscan was performed under the supervision of the Cardiology staff. At peak effect of the drug, 30 mCi Tc-68m tetrofosmin was injected intravenously and standard myocardial SPECT imaging was performed. Quantitative gated imaging was also performed to evaluate left ventricular wall motion, and estimate left ventricular ejection fraction. COMPARISON:  None. FINDINGS: Perfusion: No decreased activity in the left ventricle on stress imaging to suggest reversible ischemia or infarction. Wall  Motion: Normal left ventricular wall motion. No left ventricular dilation. Left Ventricular Ejection Fraction: 65 % End diastolic volume 77 ml End systolic volume 27 ml IMPRESSION: 1. No reversible ischemia or infarction. 2. Normal left ventricular wall motion. 3. Left ventricular ejection fraction 65% 4. Non invasive risk stratification*: Low *2012 Appropriate Use Criteria for Coronary Revascularization Focused Update: J Am Coll Cardiol. 2012;59(9):857-881. http://content.dementiazones.com.aspx?articleid=[email or web address] Electronically Signed   By: Charline Bills M.D.   On: 04/13/2016 13:14     PERTINENT LAB RESULTS: CBC:  Recent Labs  04/12/16 1639  WBC 10.3  HGB 16.1  HCT 47.6  PLT 230   CMET CMP     Component Value Date/Time   NA 140 04/13/2016 0438   K 3.6 04/13/2016 0438   CL 107 04/13/2016 0438   CO2 25 04/13/2016 0438   GLUCOSE 138 (H) 04/13/2016 0438   BUN 17 04/13/2016 0438   CREATININE 1.01 04/13/2016 0438   CALCIUM 9.3 04/13/2016 0438  PROT 6.8 04/13/2016 0438   ALBUMIN 3.7 04/13/2016 0438   AST 18 04/13/2016 0438   ALT 18 04/13/2016 0438   ALKPHOS 52 04/13/2016 0438   BILITOT 0.5 04/13/2016 0438   GFRNONAA >60 04/13/2016 0438   GFRAA >60 04/13/2016 0438    GFR Estimated Creatinine Clearance (by C-G formula based on SCr of 1.01 mg/dL) Male: 96.059.7 mL/min Male: 72.7 mL/min No results for input(s): LIPASE, AMYLASE in the last 72 hours.  Recent Labs  04/12/16 2310 04/13/16 0438  TROPONINI <0.03 <0.03   Invalid input(s): POCBNP No results for input(s): DDIMER in the last 72 hours. No results for input(s): HGBA1C in the last 72 hours.  Recent Labs  04/13/16 0438  CHOL 160  HDL 36*  LDLCALC 91  TRIG [id number]*  CHOLHDL 4.4   No results for input(s): TSH, T4TOTAL, T3FREE, THYROIDAB in the last 72 hours.  Invalid input(s): FREET3 No results for input(s): VITAMINB12, FOLATE, FERRITIN, TIBC, IRON, RETICCTPCT in the last 72 hours. Coags: No results for  input(s): INR in the last 72 hours.  Invalid input(s): PT Microbiology: No results found for this or any previous visit (from the past 240 hour(s)).  FURTHER DISCHARGE INSTRUCTIONS:  Get Medicines reviewed and adjusted: Please take all your medications with you for your next visit with your Primary MD  Laboratory/radiological data: Please request your Primary MD to go over all hospital tests and procedure/radiological results at the follow up, please ask your Primary MD to get all Hospital records sent to his/her office.  In some cases, they will be blood work, cultures and biopsy results pending at the time of your discharge. Please request that your primary care M.D. goes through all the records of your hospital data and follows up on these results.  Also Note the following: If you experience worsening of your admission symptoms, develop shortness of breath, life threatening emergency, suicidal or homicidal thoughts you must seek medical attention immediately by calling 911 or calling your MD immediately  if symptoms less severe.  You must read complete instructions/literature along with all the possible adverse reactions/side effects for all the Medicines you take and that have been prescribed to you. Take any new Medicines after you have completely understood and accpet all the possible adverse reactions/side effects.   Do not drive when taking Pain medications or sleeping medications (Benzodaizepines)  Do not take more than prescribed Pain, Sleep and Anxiety Medications. It is not advisable to combine anxiety,sleep and pain medications without talking with your primary care practitioner  Special Instructions: If you have smoked or chewed Tobacco  in the last 2 yrs please stop smoking, stop any regular Alcohol  and or any Recreational drug use.  Wear Seat belts while driving.  Please note: You were cared for by a hospitalist during your hospital stay. Once you are discharged, your  primary care physician will handle any further medical issues. Please note that NO REFILLS for any discharge medications will be authorized once you are discharged, as it is imperative that you return to your primary care physician (or establish a relationship with a primary care physician if you do not have one) for your post hospital discharge needs so that they can reassess your need for medications and monitor your lab values.  Total Time spent coordinating discharge including counseling, education and face to face time equals 25 minutes.  SignedJeoffrey Massed: Koal Eslinger 04/13/2016 1:26 PM

## 2016-04-14 LAB — HEMOGLOBIN A1C
Hgb A1c MFr Bld: 6.7 % — ABNORMAL HIGH (ref 4.8–5.6)
Mean Plasma Glucose: 146 mg/dL

## 2016-05-12 ENCOUNTER — Ambulatory Visit (INDEPENDENT_AMBULATORY_CARE_PROVIDER_SITE_OTHER): Payer: Medicare Other | Admitting: Family Medicine

## 2016-05-12 VITALS — BP 120/78 | HR 78 | Temp 98.8°F | Resp 18 | Ht 68.0 in | Wt 211.0 lb

## 2016-05-12 DIAGNOSIS — R079 Chest pain, unspecified: Secondary | ICD-10-CM

## 2016-05-12 DIAGNOSIS — E785 Hyperlipidemia, unspecified: Secondary | ICD-10-CM

## 2016-05-12 DIAGNOSIS — E119 Type 2 diabetes mellitus without complications: Secondary | ICD-10-CM | POA: Diagnosis not present

## 2016-05-12 DIAGNOSIS — I10 Essential (primary) hypertension: Secondary | ICD-10-CM

## 2016-05-12 LAB — BASIC METABOLIC PANEL
BUN: 14 mg/dL (ref 7–25)
CHLORIDE: 104 mmol/L (ref 98–110)
CO2: 26 mmol/L (ref 20–31)
CREATININE: 0.93 mg/dL (ref 0.70–1.18)
Calcium: 9.7 mg/dL (ref 8.6–10.3)
GLUCOSE: 144 mg/dL — AB (ref 65–99)
Potassium: 4.2 mmol/L (ref 3.5–5.3)
Sodium: 138 mmol/L (ref 135–146)

## 2016-05-12 MED ORDER — ATORVASTATIN CALCIUM 40 MG PO TABS: 40.0000 mg | ORAL_TABLET | Freq: Every day | ORAL | 2 refills | Status: DC

## 2016-05-12 MED ORDER — LISINOPRIL 5 MG PO TABS: 5.0000 mg | ORAL_TABLET | Freq: Every day | ORAL | 2 refills | Status: DC

## 2016-05-12 MED ORDER — METFORMIN HCL 500 MG PO TABS: 500.0000 mg | ORAL_TABLET | Freq: Two times a day (BID) | ORAL | 3 refills | Status: DC

## 2016-05-12 NOTE — Patient Instructions (Addendum)
  Thank you for coming in,   Please let us know if there are any problems with taking the medications.   You can take the statin three times a week if it is expensive.   Please follow up with us in three months   Please let us know if you would like to take a diabetes education course.    Please feel free to call with any questions or concerns at any time, at [phone]. --Dr. Jordan LikesSchmitz  Diet Recommendations for Diabetes   Starchy (carb) foods include: Bread, rice, pasta, potatoes, corn, crackers, bagels, muffins, all baked goods.   Protein foods include: Meat, fish, poultry, eggs, dairy foods, and beans such as pinto and kidney beans (beans also provide carbohydrate).   1. Eat at least 3 meals and 1-2 snacks per day. Never go more than 4-5 hours while awake without eating.  2. Limit starchy foods to TWO per meal and ONE per snack. ONE portion of a starchy  food is equal to the following:   - ONE slice of bread (or its equivalent, such as half of a hamburger bun).   - 1/2 cup of a "scoopable" starchy food such as potatoes or rice.   - 1 OUNCE (28 grams) of starchy snack foods such as crackers or pretzels (look on label).   - 15 grams of carbohydrate as shown on food label.  3. Both lunch and dinner should include a protein food, a carb food, and vegetables.   - Obtain twice as many veg's as protein or carbohydrate foods for both lunch and dinner.   - Try to keep frozen veg's on hand for a quick vegetable serving.     - Fresh or frozen veg's are best.  4. Breakfast should always include protein.      IF you received an x-ray today, you will receive an invoice from [facility]. Please contact [facility] at [phone] with questions or concerns regarding your invoice.   IF you received labwork today, you will receive an invoice from [facility]. Please contact [facility] at [phone] with questions or concerns regarding your invoice.    Our billing staff will not be able to assist you with questions regarding bills from these companies.  You will be contacted with the lab results as soon as they are available. The fastest way to get your results is to activate your My Chart account. Instructions are located on the last page of this paperwork. If you have not heard from us regarding the results in 2 weeks, please contact this office.

## 2016-05-12 NOTE — Assessment & Plan Note (Signed)
Newly diagnosed with A1c 6.7.  - initiate metformin 500 mg bid.  - he will follow up in three months for re-check.  - counseled on diet and exercise.  - consider diabetic education class at follow up.  - provided diet recommendations list.

## 2016-05-12 NOTE — Assessment & Plan Note (Signed)
Stress test was normal. He has started on medications for HLD, HTN and DM2 as well as ASA 81mg  daily. Possible to be related to GERD. May need a EGD if still continues to have symptoms.  - f/u PRN

## 2016-05-12 NOTE — Assessment & Plan Note (Addendum)
Newly diagnosed and controlled today. Has been taking lisinopril  - refilled ACEi for three months  - bmp check today

## 2016-05-12 NOTE — Assessment & Plan Note (Addendum)
Newly diagnosed and initiated on statin after discharge. He reports it is expensive for him.  - advised he could take it three times per week to get it to last longer  - refilled for three months.

## 2016-05-12 NOTE — Progress Notes (Signed)
   Subjective:    Patient ID: Gabriel Wu, male    DOB: 28-Mar-1941, 75 y.o.   MRN: [id number]  Chief Complaint  Patient presents with  . Follow-up    HOSP VISIT    PCP: No PCP Per Patient  HPI  This is a 75 y.o. male who is presenting with hospital follow up for chest pain.  His work up was unrevealing with a negative nuclear stress test. He was started on a daily ASA. He was diagnosed with Hyperlipidemia and placed on a statin. Also diagnosed with hypertension and started on lisinopril. He denies any further chest pain. He denies any shortness of breath. He reports to taking his medication. He reports the statin is expensive.   His A1c was also found to be 6.7  ROS: No unexpected weight loss, fever, chills, swelling, instability, muscle pain, numbness/tingling, redness, otherwise see HPI   Review of Systems  PMH: urolithiasis  PShx: tonsillectomy  PSx: former smoker, no alcohol use, retired.   FHx: leukemia, heart attack, stomach cancer.     Objective:   Physical Exam BP 120/78 (BP Location: Right Arm, Patient Position: Sitting, Cuff Size: Small)   Pulse 78   Temp 98.8 F (37.1 C) (Oral)   Resp 18   Ht 5\' 8"  (1.727 m)   Wt 211 lb (95.7 kg)   SpO2 97%   BMI 32.08 kg/m  Gen: NAD, alert, cooperative with exam, well-appearing HEENT: NCAT, EOMI, clear conjunctiva, oropharynx clear, supple neck CV: RRR, good S1/S2, no murmur, no edema, capillary refill brisk  Resp: CTABL, no wheezes, non-labored Abd: SNTND, BS present, no guarding or organomegaly Skin: no rashes, normal turgor  Neuro: no gross deficits.  Psych: good insight, alert and oriented    Assessment & Plan:   Essential hypertension Newly diagnosed and controlled today. Has been taking lisinopril  - refilled ACEi for three months  - bmp check today    DM (diabetes mellitus) (HCC) Newly diagnosed with A1c 6.7.  - initiate metformin 500 mg bid.  - he will follow up in three months for re-check.  -  counseled on diet and exercise.  - consider diabetic education class at follow up.  - provided diet recommendations list.   Hyperlipemia Newly diagnosed and initiated on statin after discharge. He reports it is expensive for him.  - advised he could take it three times per week to get it to last longer  - refilled for three months.   Chest pain Stress test was normal. He has started on medications for HLD, HTN and DM2 as well as ASA 81mg  daily. Possible to be related to GERD. May need a EGD if still continues to have symptoms.  - f/u PRN

## 2016-08-03 ENCOUNTER — Other Ambulatory Visit: Payer: Self-pay | Admitting: Family Medicine

## 2016-08-03 DIAGNOSIS — E785 Hyperlipidemia, unspecified: Secondary | ICD-10-CM

## 2016-08-03 DIAGNOSIS — I10 Essential (primary) hypertension: Secondary | ICD-10-CM

## 2016-08-08 ENCOUNTER — Other Ambulatory Visit: Payer: Self-pay | Admitting: Family Medicine

## 2016-08-08 DIAGNOSIS — E785 Hyperlipidemia, unspecified: Secondary | ICD-10-CM

## 2016-08-08 DIAGNOSIS — I10 Essential (primary) hypertension: Secondary | ICD-10-CM

## 2016-08-11 ENCOUNTER — Ambulatory Visit (INDEPENDENT_AMBULATORY_CARE_PROVIDER_SITE_OTHER): Payer: Medicare Other | Admitting: Family Medicine

## 2016-08-11 VITALS — BP 140/86 | HR 86 | Temp 98.4°F | Resp 17 | Ht 69.0 in | Wt 202.0 lb

## 2016-08-11 DIAGNOSIS — I10 Essential (primary) hypertension: Secondary | ICD-10-CM

## 2016-08-11 DIAGNOSIS — E119 Type 2 diabetes mellitus without complications: Secondary | ICD-10-CM | POA: Diagnosis not present

## 2016-08-11 DIAGNOSIS — B351 Tinea unguium: Secondary | ICD-10-CM

## 2016-08-11 DIAGNOSIS — E782 Mixed hyperlipidemia: Secondary | ICD-10-CM | POA: Diagnosis not present

## 2016-08-11 DIAGNOSIS — E785 Hyperlipidemia, unspecified: Secondary | ICD-10-CM

## 2016-08-11 DIAGNOSIS — Z1211 Encounter for screening for malignant neoplasm of colon: Secondary | ICD-10-CM | POA: Diagnosis not present

## 2016-08-11 LAB — POCT GLYCOSYLATED HEMOGLOBIN (HGB A1C): HEMOGLOBIN A1C: 6.5

## 2016-08-11 MED ORDER — LISINOPRIL 5 MG PO TABS: 5.0000 mg | ORAL_TABLET | Freq: Every day | ORAL | 3 refills | Status: DC

## 2016-08-11 MED ORDER — ASPIRIN 81 MG PO TBEC: 81.0000 mg | DELAYED_RELEASE_TABLET | Freq: Every day | ORAL | 3 refills | Status: DC

## 2016-08-11 MED ORDER — OMEPRAZOLE MAGNESIUM 20 MG PO TBEC: 20.0000 mg | DELAYED_RELEASE_TABLET | ORAL | 3 refills | Status: DC

## 2016-08-11 MED ORDER — METFORMIN HCL 500 MG PO TABS: 500.0000 mg | ORAL_TABLET | Freq: Two times a day (BID) | ORAL | 3 refills | Status: DC

## 2016-08-11 MED ORDER — ATORVASTATIN CALCIUM 40 MG PO TABS: 40.0000 mg | ORAL_TABLET | Freq: Every day | ORAL | 3 refills | Status: DC

## 2016-08-11 NOTE — Progress Notes (Addendum)
Chief Complaint  Patient presents with  . Medication Refill    all on list per patient   . Hypertension    HPI   Diabetes Mellitus: Chronic problem  Patient presents for follow up of diabetes. Symptoms: none. Symptoms have stabilized. Patient denies hyperglycemia, hypoglycemia , increase appetite, nausea, paresthesia of the feet, polydipsia, polyuria, visual disturbances, vomitting and weight loss.  Evaluation to date has been included: hemoglobin A1C.  Home sugars: patient does not check sugars. Treatment to date: Continued metformin which has been effective.  Lab Results  Component Value Date   HGBA1C 6.5 08/11/2016   Hypertension:chronic problem  Patient here for follow-up of elevated blood pressure. He is exercising and is adherent to low salt diet.  Blood pressure is not checked at home.. Cardiac symptoms none. Patient denies chest pain, chest pressure/discomfort, claudication, exertional chest pressure/discomfort, fatigue, irregular heart beat, lower extremity edema, near-syncope and palpitations.  Cardiovascular risk factors: advanced age (older than 77 for men, 81 for women), diabetes mellitus, hypertension and male gender. Use of agents associated with hypertension: none. History of target organ damage: none.  He takes lisinopril 5mg  and took his last pill this morning. BP Readings from Last 3 Encounters:  08/11/16 140/86  05/12/16 120/78  04/13/16 (!) 145/75   Hyperlipidemia: chronic   Patient presents with hyperlipidemia.  He was tested because of routine screening.   negative. There is not a family history of hyperlipidemia. There is not a family history of early ischemia heart disease. Lab Results  Component Value Date   CHOL 160 04/13/2016   Lab Results  Component Value Date   HDL 36 (L) 04/13/2016   Lab Results  Component Value Date   LDLCALC 91 04/13/2016   Lab Results  Component Value Date   TRIG 165 (H) 04/13/2016   Lab Results  Component Value Date   CHOLHDL 4.4 04/13/2016   No results found for: LDLDIRECT   Onychomycosis- new problem Pt reports that he cares for his own feet He denies numbness, tingling, cuts or bleeding spots He admits that he has some changes to his toe nails that makes them hard to trim  Past Medical History:  Diagnosis Date  . Urolithiasis    Past stone spontaneously per patient.    Current Outpatient Prescriptions  Medication Sig Dispense Refill  . aspirin EC 81 MG EC tablet Take 1 tablet (81 mg total) by mouth daily.    Marland Kitchen atorvastatin (LIPITOR) 40 MG tablet Take 1 tablet (40 mg total) by mouth daily at 6 PM. 30 tablet 2  . lisinopril (PRINIVIL,ZESTRIL) 5 MG tablet Take 1 tablet (5 mg total) by mouth daily. 30 tablet 2  . metFORMIN (GLUCOPHAGE) 500 MG tablet Take 1 tablet (500 mg total) by mouth 2 (two) times daily with a meal. 180 tablet 3  . omeprazole (PRILOSEC OTC) 20 MG tablet Take 20 mg by mouth every other day.     No current facility-administered medications for this visit.     Allergies: No Known Allergies  No past surgical history on file.  Social History   Social History  . Marital status: Single    Spouse name: N/A  . Number of children: N/A  . Years of education: N/A   Social History Main Topics  . Smoking status: Former Smoker    Years: 50.00    Types: Cigars    Quit date: 07/21/2010  . Smokeless tobacco: Never Used  . Alcohol use No  . Drug use: No  .  Sexual activity: No   Other Topics Concern  . None   Social History Narrative  . None    Review of Systems  Constitutional: Negative for chills and fever.  Eyes: Negative for blurred vision, double vision and photophobia.  Respiratory: Negative for cough and wheezing.   Cardiovascular: Negative for chest pain, palpitations and orthopnea.  Gastrointestinal: Negative for diarrhea, nausea and vomiting.  Genitourinary: Negative for dysuria, frequency and urgency.  Musculoskeletal: Positive for back pain. Negative for  falls.  Skin: Negative for itching and rash.  Neurological: Negative for dizziness, tingling and headaches.  Psychiatric/Behavioral: The patient does not have insomnia.     Objective: Vitals:   08/11/16 1401 08/11/16 1421  BP: (!) 170/82 140/86  Pulse: 86   Resp: 17   Temp: 98.4 F (36.9 C)   TempSrc: Oral   SpO2: 97%   Weight: 202 lb (91.6 kg)   Height: 5\' 9"  (1.753 m)     Physical Exam  Constitutional: He is oriented to person, place, and time. He appears well-developed and well-nourished.  HENT:  Head: Normocephalic and atraumatic.  Right Ear: External ear normal.  Left Ear: External ear normal.  Mouth/Throat: Oropharynx is clear and moist.  Eyes: Conjunctivae and EOM are normal.  Neck: Normal range of motion. Neck supple.  Cardiovascular: Normal rate, regular rhythm and normal heart sounds.   No murmur heard. Pulses:      Dorsalis pedis pulses are 2+ on the right side, and 2+ on the left side.       Posterior tibial pulses are 2+ on the right side, and 2+ on the left side.  Pulmonary/Chest: Effort normal and breath sounds normal. No respiratory distress. He has no wheezes.  Abdominal: Soft. Bowel sounds are normal. He exhibits no distension and no mass. There is no tenderness. There is no rebound and no guarding. No hernia.  Musculoskeletal: Normal range of motion. He exhibits no edema.       Right foot: There is deformity.       Left foot: There is deformity.  Feet:  Right Foot:  Protective Sensation: 4 sites tested. 4 sites sensed.  Skin Integrity: Negative for ulcer, blister, skin breakdown, erythema, warmth, callus or dry skin.  Left Foot:  Protective Sensation: 4 sites tested. 4 sites sensed.  Skin Integrity: Negative for ulcer, blister, skin breakdown, erythema, warmth, callus or dry skin.  Neurological: He is alert and oriented to person, place, and time.  Skin: Skin is warm. Capillary refill takes less than 2 seconds.  Psychiatric: He has a normal mood and  affect. His behavior is normal. Judgment and thought content normal.  Toenails bilateral thickened hardened nails   Assessment and Plan Gabriel Wu was seen today for medication refill and hypertension.  Diagnoses and all orders for this visit:  Type 2 diabetes mellitus without complication, without long-term current use of insulin (HCC)- diabetes well controlled, will continue metformin on the current dose -     Ambulatory referral to Podiatry -     Comprehensive metabolic panel -     Lipid panel -     Microalbumin, urine -     POCT glycosylated hemoglobin (Hb A1C) -     Ambulatory referral to Ophthalmology  Mixed hyperlipidemia- discussed that he should continue statin therapy -     Comprehensive metabolic panel -     Lipid panel -     Microalbumin, urine  Essential hypertension- discussed that he should continue his low dose lisinopril 5mg   for renal protection -     Comprehensive metabolic panel -     Lipid panel -     Microalbumin, urine  Onychomycosis- discussed that he should follow up with Podiatry for toe and foot care to prevent injuries at home trying to trim his own nails given the extent of the hardness of the nail -     Ambulatory referral to Podiatry  Screen for colon cancer- discussed options for screening  Pt declined colonoscopy  Will send order for cologuard -     Cologuard     Gabriel Wu

## 2016-08-11 NOTE — Patient Instructions (Addendum)
     IF you received an x-ray today, you will receive an invoice from [facility]. Please contact [facility] at [phone] with questions or concerns regarding your invoice.   IF you received labwork today, you will receive an invoice from [facility]. Please contact [facility] at [phone] with questions or concerns regarding your invoice.   Our billing staff will not be able to assist you with questions regarding bills from these companies.  You will be contacted with the lab results as soon as they are available. The fastest way to get your results is to activate your My Chart account. Instructions are located on the last page of this paperwork. If you have not heard from us regarding the results in 2 weeks, please contact this office.     Colorectal Cancer Screening Colorectal cancer screening is a group of tests used to check for colorectal cancer. Colorectal refers to your colon and rectum. Your colon and rectum are located at the end of your large intestine and carry your bowel movements out of your body. Why is colorectal cancer screening done? It is common for abnormal growths (polyps) to form in the lining of your colon, especially as you get older. These polyps can be cancerous or become cancerous. If colorectal cancer is found at an early stage, it is treatable. Who should be screened for colorectal cancer? Screening is recommended for all adults at average risk starting at age 50. Tests may be recommended every 1 to 10 years. Your health care provider may recommend earlier or more frequent screening if you have:  A history of colorectal cancer or polyps.  A family member with a history of colorectal cancer or polyps.  Inflammatory bowel disease, such as ulcerative colitis or Crohn disease.  A type of hereditary colon cancer syndrome.  Colorectal cancer symptoms.  Types of screening tests There are several types of colorectal screening tests. They  include:  Guaiac-based fecal occult blood testing.  Fecal immunochemical test (FIT).  Stool DNA test.  Barium enema.  Virtual colonoscopy.  Sigmoidoscopy. During this test, a sigmoidoscope is used to examine your rectum and lower colon. A sigmoidoscope is a flexible tube with a camera that is inserted through your anus into your rectum and lower colon.  Colonoscopy. During this test, a colonoscope is used to examine your entire colon. A colonoscope is a long, thin, flexible tube with a camera. This test examines your entire colon and rectum.  This information is not intended to replace advice given to you by your health care provider. Make sure you discuss any questions you have with your health care provider. Document Released: 12/25/2009 Document Revised: 02/14/2016 Document Reviewed: 10/13/2013 Elsevier Interactive Patient Education  2017 Elsevier Inc.  

## 2016-08-12 LAB — COMPREHENSIVE METABOLIC PANEL
ALK PHOS: 87 IU/L (ref 39–117)
ALT: 20 IU/L (ref 0–44)
AST: 18 IU/L (ref 0–40)
Albumin/Globulin Ratio: 2 (ref 1.2–2.2)
Albumin: 4.7 g/dL (ref 3.5–4.8)
BUN/Creatinine Ratio: 17 (ref 10–24)
BUN: 17 mg/dL (ref 8–27)
Bilirubin Total: 0.4 mg/dL (ref 0.0–1.2)
CALCIUM: 9.7 mg/dL (ref 8.6–10.2)
CO2: 21 mmol/L (ref 18–29)
CREATININE: 0.98 mg/dL (ref 0.76–1.27)
Chloride: 100 mmol/L (ref 96–106)
GFR calc Af Amer: 87 mL/min/{1.73_m2} (ref >59)
GFR, EST NON AFRICAN AMERICAN: 75 mL/min/{1.73_m2} (ref >59)
GLUCOSE: 109 mg/dL — AB (ref 65–99)
Globulin, Total: 2.3 g/dL (ref 1.5–4.5)
Potassium: 4.2 mmol/L (ref 3.5–5.2)
Sodium: 141 mmol/L (ref 134–144)
TOTAL PROTEIN: 7 g/dL (ref 6.0–8.5)

## 2016-08-12 LAB — LIPID PANEL
CHOL/HDL RATIO: 2.2 ratio (ref 0.0–5.0)
CHOLESTEROL TOTAL: 89 mg/dL — AB (ref 100–199)
HDL: 41 mg/dL (ref >39)
LDL CALC: 28 mg/dL (ref 0–99)
Triglycerides: 100 mg/dL (ref 0–149)
VLDL CHOLESTEROL CAL: 20 mg/dL (ref 5–40)

## 2016-08-12 LAB — MICROALBUMIN, URINE: Microalbumin, Urine: 26.8 ug/mL

## 2016-08-20 ENCOUNTER — Encounter: Payer: Self-pay | Admitting: Family Medicine

## 2016-08-20 ENCOUNTER — Encounter: Payer: Self-pay | Admitting: Podiatry

## 2016-08-20 ENCOUNTER — Ambulatory Visit (INDEPENDENT_AMBULATORY_CARE_PROVIDER_SITE_OTHER): Payer: Medicare Other | Admitting: Podiatry

## 2016-08-20 VITALS — Ht 69.0 in | Wt 202.0 lb

## 2016-08-20 DIAGNOSIS — M79672 Pain in left foot: Secondary | ICD-10-CM

## 2016-08-20 DIAGNOSIS — M2042 Other hammer toe(s) (acquired), left foot: Secondary | ICD-10-CM | POA: Diagnosis not present

## 2016-08-20 DIAGNOSIS — B351 Tinea unguium: Secondary | ICD-10-CM | POA: Diagnosis not present

## 2016-08-20 DIAGNOSIS — M79671 Pain in right foot: Secondary | ICD-10-CM

## 2016-08-20 NOTE — Progress Notes (Signed)
SUBJECTIVE: 76 y.o. year old male presents for diabetic foot care. He is recently diagnosed with Diabetes.  2nd toe on left foot hurts in closed in shoes. Most recent HgA1c was 6.5.  REVIEW OF SYSTEMS: Pertinent items noted in HPI and remainder of comprehensive ROS otherwise negative.  OBJECTIVE: DERMATOLOGIC EXAMINATION: Thick dystrophic nails x 10.  VASCULAR EXAMINATION OF LOWER LIMBS: All pedal pulses are palpable with normal pulsation.  No edema or erythema noted. Temperature gradient from tibial crest to dorsum of foot is within normal bilateral.  NEUROLOGIC EXAMINATION OF THE LOWER LIMBS: Achilles DTR is present and within normal. Monofilament (Semmes-Weinstein 10-gm) sensory testing positive 6 out of 6, bilateral. Vibratory sensations(128Hz  turning fork) intact at medial and lateral forefoot bilateral.  Sharp and Dull discriminatory sensations at the plantar ball of hallux is intact bilateral.   MUSCULOSKELETAL EXAMINATION: Severely contracted 2nd digit left, non reducible.   ASSESSMENT: Onychomycosis x 10. Severe hammer toe 2nd left pain with closed in shoes.  PLAN: Reviewed clinical findings and available treatment options. All nails debrided. Proper shoe gear reviewed.

## 2016-08-20 NOTE — Patient Instructions (Signed)
Seen for diabetic foot check. Findings of Neurovascular status of lower limbs are within normal. Both feet has thick yellow dystrophic nails. All fungal nails debrided. Return in 3 months.

## 2016-09-08 ENCOUNTER — Other Ambulatory Visit: Payer: Self-pay | Admitting: Family Medicine

## 2016-09-08 MED ORDER — OMEPRAZOLE 20 MG PO CPDR: 20.0000 mg | DELAYED_RELEASE_CAPSULE | Freq: Every day | ORAL | 3 refills | Status: DC

## 2016-09-08 NOTE — Progress Notes (Signed)
Changed prescription from every other day to daily

## 2016-11-18 ENCOUNTER — Ambulatory Visit: Payer: Medicare Other | Admitting: Podiatry

## 2017-01-19 DIAGNOSIS — H40013 Open angle with borderline findings, low risk, bilateral: Secondary | ICD-10-CM | POA: Diagnosis not present

## 2017-01-19 DIAGNOSIS — H40053 Ocular hypertension, bilateral: Secondary | ICD-10-CM | POA: Diagnosis not present

## 2017-01-19 DIAGNOSIS — H2513 Age-related nuclear cataract, bilateral: Secondary | ICD-10-CM | POA: Diagnosis not present

## 2017-01-19 DIAGNOSIS — H25013 Cortical age-related cataract, bilateral: Secondary | ICD-10-CM | POA: Diagnosis not present

## 2017-02-20 DIAGNOSIS — H40022 Open angle with borderline findings, high risk, left eye: Secondary | ICD-10-CM | POA: Diagnosis not present

## 2017-02-20 DIAGNOSIS — H40053 Ocular hypertension, bilateral: Secondary | ICD-10-CM | POA: Diagnosis not present

## 2017-02-20 DIAGNOSIS — H401112 Primary open-angle glaucoma, right eye, moderate stage: Secondary | ICD-10-CM | POA: Diagnosis not present

## 2017-03-09 ENCOUNTER — Ambulatory Visit (INDEPENDENT_AMBULATORY_CARE_PROVIDER_SITE_OTHER): Payer: Medicare Other | Admitting: Family Medicine

## 2017-03-09 ENCOUNTER — Encounter: Payer: Self-pay | Admitting: Family Medicine

## 2017-03-09 VITALS — BP 164/82 | HR 78 | Temp 99.0°F | Resp 18 | Ht 69.0 in | Wt 195.2 lb

## 2017-03-09 DIAGNOSIS — E785 Hyperlipidemia, unspecified: Secondary | ICD-10-CM | POA: Diagnosis not present

## 2017-03-09 DIAGNOSIS — E119 Type 2 diabetes mellitus without complications: Secondary | ICD-10-CM

## 2017-03-09 DIAGNOSIS — Z1211 Encounter for screening for malignant neoplasm of colon: Secondary | ICD-10-CM

## 2017-03-09 DIAGNOSIS — I10 Essential (primary) hypertension: Secondary | ICD-10-CM

## 2017-03-09 DIAGNOSIS — K219 Gastro-esophageal reflux disease without esophagitis: Secondary | ICD-10-CM

## 2017-03-09 DIAGNOSIS — Z5181 Encounter for therapeutic drug level monitoring: Secondary | ICD-10-CM | POA: Diagnosis not present

## 2017-03-09 MED ORDER — ATORVASTATIN CALCIUM 40 MG PO TABS: 40.0000 mg | ORAL_TABLET | Freq: Every day | ORAL | 1 refills | Status: DC

## 2017-03-09 MED ORDER — LISINOPRIL 10 MG PO TABS: 10.0000 mg | ORAL_TABLET | Freq: Every day | ORAL | 1 refills | Status: DC

## 2017-03-09 MED ORDER — METFORMIN HCL 500 MG PO TABS: 500.0000 mg | ORAL_TABLET | Freq: Two times a day (BID) | ORAL | 1 refills | Status: DC

## 2017-03-09 MED ORDER — OMEPRAZOLE 20 MG PO CPDR: 20.0000 mg | DELAYED_RELEASE_CAPSULE | Freq: Every day | ORAL | 1 refills | Status: DC

## 2017-03-09 NOTE — Patient Instructions (Addendum)
I will check labs today, but if cholesterol is elevated due to your cereal this morning, we may need to repeat that testing after fasting for 8 hours.   Omeprazole if needed up to once per day, but try to avoid foods below that can worse heartburn. I will check magnesium level, but would also consider B12 level and vitamin D levels with chronic use of omeprazole (these were not covered by Medicare, but can look into cost out of pocket if you would like to have those checked).   Blood pressure is too high. I increased lisinopril to 10mg  once per day. Keep a record of your blood pressures outside of the office and if remaining over 130/80 in next 2-3 weeks - return for stronger medication.   Cologuard reordered today.   Food Choices for Gastroesophageal Reflux Disease, Adult When you have gastroesophageal reflux disease (GERD), the foods you eat and your eating habits are very important. Choosing the right foods can help ease the discomfort of GERD. Consider working with a diet and nutrition specialist (dietitian) to help you make healthy food choices. What general guidelines should I follow? Eating plan  Choose healthy foods low in fat, such as fruits, vegetables, whole grains, low-fat dairy products, and lean meat, fish, and poultry.  Eat frequent, small meals instead of three large meals each day. Eat your meals slowly, in a relaxed setting. Avoid bending over or lying down until 2-3 hours after eating.  Limit high-fat foods such as fatty meats or fried foods.  Limit your intake of oils, butter, and shortening to less than 8 teaspoons each day.  Avoid the following: ? Foods that cause symptoms. These may be different for different people. Keep a food diary to keep track of foods that cause symptoms. ? Alcohol. ? Drinking large amounts of liquid with meals. ? Eating meals during the 2-3 hours before bed.  Cook foods using methods other than frying. This may include baking, grilling, or  broiling. Lifestyle   Maintain a healthy weight. Ask your health care provider what weight is healthy for you. If you need to lose weight, work with your health care provider to do so safely.  Exercise for at least 30 minutes on 5 or more days each week, or as told by your health care provider.  Avoid wearing clothes that fit tightly around your waist and chest.  Do not use any products that contain nicotine or tobacco, such as cigarettes and e-cigarettes. If you need help quitting, ask your health care provider.  Sleep with the head of your bed raised. Use a wedge under the mattress or blocks under the bed frame to raise the head of the bed. What foods are not recommended? The items listed may not be a complete list. Talk with your dietitian about what dietary choices are best for you. Grains Pastries or quick breads with added fat. [place] toast. Vegetables Deep fried vegetables. [place] fries. Any vegetables prepared with added fat. Any vegetables that cause symptoms. For some people this may include tomatoes and tomato products, chili peppers, onions and garlic, and horseradish. Fruits Any fruits prepared with added fat. Any fruits that cause symptoms. For some people this may include citrus fruits, such as oranges, grapefruit, pineapple, and lemons. Meats and other protein foods High-fat meats, such as fatty beef or pork, hot dogs, ribs, ham, sausage, salami and bacon. Fried meat or protein, including fried fish and fried chicken. Nuts and nut butters. Dairy Whole milk and chocolate milk.  Sour cream. Cream. Ice cream. Cream cheese. Milk shakes. Beverages Coffee and tea, with or without caffeine. Carbonated beverages. Sodas. Energy drinks. Fruit juice made with acidic fruits (such as orange or grapefruit). Tomato juice. Alcoholic drinks. Fats and oils Butter. Margarine. Shortening. Ghee. Sweets and desserts Chocolate and cocoa. Donuts. Seasoning and other foods Pepper. Peppermint  and spearmint. Any condiments, herbs, or seasonings that cause symptoms. For some people, this may include curry, hot sauce, or vinegar-based salad dressings. Summary  When you have gastroesophageal reflux disease (GERD), food and lifestyle choices are very important to help ease the discomfort of GERD.  Eat frequent, small meals instead of three large meals each day. Eat your meals slowly, in a relaxed setting. Avoid bending over or lying down until 2-3 hours after eating.  Limit high-fat foods such as fatty meat or fried foods. This information is not intended to replace advice given to you by your health care provider. Make sure you discuss any questions you have with your health care provider. Document Released: 07/07/2005 Document Revised: 07/08/2016 Document Reviewed: 07/08/2016 Elsevier Interactive Patient Education  2017 [employer].   IF you received an x-ray today, you will receive an invoice from [facility]. Please contact [facility] at [phone] with questions or concerns regarding your invoice.   IF you received labwork today, you will receive an invoice from [facility]. Please contact [facility] at [phone] with questions or concerns regarding your invoice.   Our billing staff will not be able to assist you with questions regarding bills from these companies.  You will be contacted with the lab results as soon as they are available. The fastest way to get your results is to activate your My Chart account. Instructions are located on the last page of this paperwork. If you have not heard from [place] regarding the results in 2 weeks, please contact this office.

## 2017-03-09 NOTE — Addendum Note (Signed)
Addended by: Alver Fisher L on: 03/09/2017 10:00 AM   Modules accepted: Orders

## 2017-03-09 NOTE — Progress Notes (Signed)
Subjective:  By signing my name below, I, [name], attest that this documentation has been prepared under the direction and in the presence of [name], MD Electronically Signed: Charline Bills, ED Scribe 03/09/2017 at 9:11 AM.   Patient ID: Sol Blazing, male    DOB: [birth year], 76 y.o.   MRN: [id number]  Chief Complaint  Patient presents with  . Medication Refill    all meds    HPI Tamim Skog is a 76 y.o. male who presents to Primary Care at [facility] for medication refills. Last seen in September 2017 but most recently seen by Dr. Creta Levin in January. Pt had a bowl of cereal this morning around 6:30 AM.  Lab Results  Component Value Date   HGBA1C 6.5 08/11/2016  Urine microalbumin 26.8 at January visit. Continued on Metformin 500 mg bid. Denies diarrhea, blood in stools, abdominal distention, nausea, vomiting.  Referred to podiatry and optho at last visit. Sought podiatry 1/31. Normal foot sensation exam in January at podiatry. 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 Follow-up appointment is next week.  HTN Lab Results  Component Value Date   CREATININE 0.98 08/11/2016  Takes Lisinopril 5 mg qd. Cardiac risk factors of age, does take aspirin 81 mg qd. Pt was admitted in September 2017 with chest pain. Seen by cardiology. Negative nuclear stress test on 04/13/16. Denies new myalgias, cough, chest pain, sob, light-headedness, dizziness.   Hyperlipidemia Lab Results  Component Value Date   CHOL 89 (L) 08/11/2016   HDL 41 08/11/2016   LDLCALC 28 08/11/2016   TRIG 100 08/11/2016   CHOLHDL 2.2 08/11/2016   Lab Results  Component Value Date   ALT 20 08/11/2016   AST 18 08/11/2016   ALKPHOS 87 08/11/2016   BILITOT 0.4 08/11/2016  Continued on Lipitor 40 mg qd.   Heartburn  He has used omeprazole 20 mg qd PRN. Pt is taking 1 daily. States when he skips a day he notices heartburn.   Colon CA Screening Pt  states he never received any information. Denies h/o hemorrhoids or blood in stools.   Patient Active Problem List   Diagnosis Date Noted  . DM (diabetes mellitus) (HCC) 05/12/2016  . Hyperlipemia   . Chest pain 04/12/2016  . Essential hypertension 04/12/2016  . Dyspepsia 04/12/2016   Past Medical History:  Diagnosis Date  . Urolithiasis    Past stone spontaneously per patient.   No past surgical history on file. No Known Allergies Prior to Admission medications   Medication Sig Start Date End Date Taking? Authorizing Provider  aspirin 81 MG EC tablet Take 1 tablet (81 mg total) by mouth daily. 08/11/16   Doristine Bosworth, MD  atorvastatin (LIPITOR) 40 MG tablet Take 1 tablet (40 mg total) by mouth daily at 6 PM. 08/11/16   Collie Siad A, MD  lisinopril (PRINIVIL,ZESTRIL) 5 MG tablet Take 1 tablet (5 mg total) by mouth daily. 08/11/16   Doristine Bosworth, MD  metFORMIN (GLUCOPHAGE) 500 MG tablet Take 1 tablet (500 mg total) by mouth 2 (two) times daily with a meal. 08/11/16   Doristine Bosworth, MD  omeprazole (PRILOSEC) 20 MG capsule Take 1 capsule (20 mg total) by mouth daily. 09/08/16   Doristine Bosworth, MD   Social History   Social History  . Marital status: Single    Spouse name: N/A  . Number of children: N/A  . Years of education: N/A  Occupational History  . Not on file.   Social History Main Topics  . Smoking status: Former Smoker    Years: 50.00    Types: Cigars    Quit date: 07/21/2010  . Smokeless tobacco: Never Used  . Alcohol use No  . Drug use: No  . Sexual activity: No   Other Topics Concern  . Not on file   Social History Narrative  . No narrative on file   Review of Systems  Constitutional: Negative for fatigue and unexpected weight change.  Eyes: Negative for visual disturbance.  Respiratory: Negative for cough, chest tightness and shortness of breath.   Cardiovascular: Negative for chest pain, palpitations and leg swelling.  Gastrointestinal:  Negative for abdominal distention, abdominal pain, blood in stool, diarrhea, nausea and vomiting.  Musculoskeletal: Negative for myalgias.  Neurological: Negative for dizziness, light-headedness and headaches.      Objective:   Physical Exam  Constitutional: He is oriented to person, place, and time. He appears well-developed and well-nourished.  HENT:  Head: Normocephalic and atraumatic.  Eyes: Pupils are equal, round, and reactive to light. EOM are normal.  Neck: No JVD present. Carotid bruit is not present.  Cardiovascular: Normal rate, regular rhythm and normal heart sounds.   No murmur heard. Pulmonary/Chest: Effort normal and breath sounds normal. He has no rales.  Musculoskeletal: He exhibits no edema.  Neurological: He is alert and oriented to person, place, and time.  Skin: Skin is warm and dry.  Psychiatric: He has a normal mood and affect.  Vitals reviewed.  Vitals:   03/09/17 0857 03/09/17 0858  BP: (!) 171/96 (!) 164/82  Pulse: 78   Resp: 18   Temp: 99 F (37.2 C)   TempSrc: Oral   SpO2: 95%   Weight: 195 lb 3.2 oz (88.5 kg)   Height: 5\' 9"  (1.753 m)       Assessment & Plan:    Yousif Edelson is a 76 y.o. male Essential hypertension - Plan: lisinopril (PRINIVIL,ZESTRIL) 10 MG tablet, EKG 12-Lead  - Decreased control, goal of below 130/80 with diabetes. Increase lisinopril to 10 mg daily, monitor home readings, RTC precautions. Labs pending  Hyperlipidemia, unspecified hyperlipidemia type - Plan: Comprehensive metabolic panel, Lipid panel, atorvastatin (LIPITOR) 40 MG tablet  -Tolerating Lipitor. Controlled previously. Not completely fasting this morning, will check lipid panel, but if elevated, may need repeat for lab only visit after fasting 8 hours  Type 2 diabetes mellitus without complication, without long-term current use of insulin (HCC) - Plan: Hemoglobin A1c, metFORMIN (GLUCOPHAGE) 500 MG tablet  - Stable prior. Continue metformin same dose, A1c  pending  Screen for colon cancer - Plan: Cologuard reordered   monitoring encounter - Plan: Magnesium Gastroesophageal reflux disease, esophagitis presence not specified - Plan: omeprazole (PRILOSEC) 20 MG capsule, Magnesium    - Controlled with omeprazole, but is requiring daily dosing. Check magnesium, consider B12/vitamin D testing, but not covered with Medicare. Option of out-of-pocket testing discussed. Continue omeprazole daily when necessary, trigger foods discussed to lessen potential need for PPI.  Meds ordered this encounter  Medications  . lisinopril (PRINIVIL,ZESTRIL) 10 MG tablet    Sig: Take 1 tablet (10 mg total) by mouth daily.    Dispense:  90 tablet    Refill:  1  . atorvastatin (LIPITOR) 40 MG tablet    Sig: Take 1 tablet (40 mg total) by mouth daily at 6 PM.    Dispense:  90 tablet    Refill:  1  .  metFORMIN (GLUCOPHAGE) 500 MG tablet    Sig: Take 1 tablet (500 mg total) by mouth 2 (two) times daily with a meal.    Dispense:  180 tablet    Refill:  1  . omeprazole (PRILOSEC) 20 MG capsule    Sig: Take 1 capsule (20 mg total) by mouth daily.    Dispense:  90 capsule    Refill:  1   Patient Instructions   I will check labs today, but if cholesterol is elevated due to your cereal this morning, we may need to repeat that testing after fasting for 8 hours.   Omeprazole if needed up to once per day, but try to avoid foods below that can worse heartburn. I will check magnesium level, but would also consider B12 level and vitamin D levels with chronic use of omeprazole (these were not covered by Medicare, but can look into cost out of pocket if you would like to have those checked).   Blood pressure is too high. I increased lisinopril to 10mg  once per day. Keep a record of your blood pressures outside of the office and if remaining over 130/80 in next 2-3 weeks - return for stronger medication.   Cologuard reordered today.   Food Choices for Gastroesophageal Reflux  Disease, Adult When you have gastroesophageal reflux disease (GERD), the foods you eat and your eating habits are very important. Choosing the right foods can help ease the discomfort of GERD. Consider working with a diet and nutrition specialist (dietitian) to help you make healthy food choices. What general guidelines should I follow? Eating plan  Choose healthy foods low in fat, such as fruits, vegetables, whole grains, low-fat dairy products, and lean meat, fish, and poultry.  Eat frequent, small meals instead of three large meals each day. Eat your meals slowly, in a relaxed setting. Avoid bending over or lying down until 2-3 hours after eating.  Limit high-fat foods such as fatty meats or fried foods.  Limit your intake of oils, butter, and shortening to less than 8 teaspoons each day.  Avoid the following: ? Foods that cause symptoms. These may be different for different people. Keep a food diary to keep track of foods that cause symptoms. ? Alcohol. ? Drinking large amounts of liquid with meals. ? Eating meals during the 2-3 hours before bed.  Cook foods using methods other than frying. This may include baking, grilling, or broiling. Lifestyle   Maintain a healthy weight. Ask your health care provider what weight is healthy for you. If you need to lose weight, work with your health care provider to do so safely.  Exercise for at least 30 minutes on 5 or more days each week, or as told by your health care provider.  Avoid wearing clothes that fit tightly around your waist and chest.  Do not use any products that contain nicotine or tobacco, such as cigarettes and e-cigarettes. If you need help quitting, ask your health care provider.  Sleep with the head of your bed raised. Use a wedge under the mattress or blocks under the bed frame to raise the head of the bed. What foods are not recommended? The items listed may not be a complete list. Talk with your dietitian about what  dietary choices are best for you. Grains Pastries or quick breads with added fat. [place] toast. Vegetables Deep fried vegetables. [place] fries. Any vegetables prepared with added fat. Any vegetables that cause symptoms. For some people this may include tomatoes and  tomato products, chili peppers, onions and garlic, and horseradish. Fruits Any fruits prepared with added fat. Any fruits that cause symptoms. For some people this may include citrus fruits, such as oranges, grapefruit, pineapple, and lemons. Meats and other protein foods High-fat meats, such as fatty beef or pork, hot dogs, ribs, ham, sausage, salami and bacon. Fried meat or protein, including fried fish and fried chicken. Nuts and nut butters. Dairy Whole milk and chocolate milk. Sour cream. Cream. Ice cream. Cream cheese. Milk shakes. Beverages Coffee and tea, with or without caffeine. Carbonated beverages. Sodas. Energy drinks. Fruit juice made with acidic fruits (such as orange or grapefruit). Tomato juice. Alcoholic drinks. Fats and oils Butter. Margarine. Shortening. Ghee. Sweets and desserts Chocolate and cocoa. Donuts. Seasoning and other foods Pepper. Peppermint and spearmint. Any condiments, herbs, or seasonings that cause symptoms. For some people, this may include curry, hot sauce, or vinegar-based salad dressings. Summary  When you have gastroesophageal reflux disease (GERD), food and lifestyle choices are very important to help ease the discomfort of GERD.  Eat frequent, small meals instead of three large meals each day. Eat your meals slowly, in a relaxed setting. Avoid bending over or lying down until 2-3 hours after eating.  Limit high-fat foods such as fatty meat or fried foods. This information is not intended to replace advice given to you by your health care provider. Make sure you discuss any questions you have with your health care provider. Document Released: 07/07/2005 Document Revised: 07/08/2016  Document Reviewed: 07/08/2016 Elsevier Interactive Patient Education  2017 [employer].   IF you received an x-ray today, you will receive an invoice from [facility]. Please contact [facility] at [phone] with questions or concerns regarding your invoice.   IF you received labwork today, you will receive an invoice from [facility]. Please contact [facility] at [phone] with questions or concerns regarding your invoice.   Our billing staff will not be able to assist you with questions regarding bills from these companies.  You will be contacted with the lab results as soon as they are available. The fastest way to get your results is to activate your My Chart account. Instructions are located on the last page of this paperwork. If you have not heard from [place] regarding the results in 2 weeks, please contact this office.      I personally performed the services described in this documentation, which was scribed in my presence. The recorded information has been reviewed and considered for accuracy and completeness, addended by me as needed, and agree with information above.  Signed,   Meredith Staggers, MD Primary Care at [facility].  03/09/17 9:35 AM

## 2017-03-10 LAB — HEMOGLOBIN A1C
ESTIMATED AVERAGE GLUCOSE: 137 mg/dL
HEMOGLOBIN A1C: 6.4 % — AB (ref 4.8–5.6)

## 2017-03-10 LAB — COMPREHENSIVE METABOLIC PANEL
ALBUMIN: 4.4 g/dL (ref 3.5–4.8)
ALK PHOS: 75 IU/L (ref 39–117)
ALT: 12 IU/L (ref 0–44)
AST: 18 IU/L (ref 0–40)
Albumin/Globulin Ratio: 1.4 (ref 1.2–2.2)
BUN / CREAT RATIO: 16 (ref 10–24)
BUN: 14 mg/dL (ref 8–27)
Bilirubin Total: 0.3 mg/dL (ref 0.0–1.2)
CALCIUM: 9.8 mg/dL (ref 8.6–10.2)
CO2: 22 mmol/L (ref 20–29)
CREATININE: 0.85 mg/dL (ref 0.76–1.27)
Chloride: 102 mmol/L (ref 96–106)
GFR calc Af Amer: 99 mL/min/{1.73_m2} (ref >59)
GFR, EST NON AFRICAN AMERICAN: 85 mL/min/{1.73_m2} (ref >59)
Globulin, Total: 3.1 g/dL (ref 1.5–4.5)
Glucose: 117 mg/dL — ABNORMAL HIGH (ref 65–99)
Potassium: 4.4 mmol/L (ref 3.5–5.2)
SODIUM: 139 mmol/L (ref 134–144)
Total Protein: 7.5 g/dL (ref 6.0–8.5)

## 2017-03-10 LAB — LIPID PANEL
CHOL/HDL RATIO: 2.3 ratio (ref 0.0–5.0)
Cholesterol, Total: 86 mg/dL — ABNORMAL LOW (ref 100–199)
HDL: 37 mg/dL — ABNORMAL LOW (ref >39)
LDL CALC: 28 mg/dL (ref 0–99)
Triglycerides: 106 mg/dL (ref 0–149)
VLDL Cholesterol Cal: 21 mg/dL (ref 5–40)

## 2017-03-10 LAB — MAGNESIUM: Magnesium: 1.7 mg/dL (ref 1.6–2.3)

## 2017-04-07 DIAGNOSIS — H40053 Ocular hypertension, bilateral: Secondary | ICD-10-CM | POA: Diagnosis not present

## 2017-04-07 DIAGNOSIS — H401112 Primary open-angle glaucoma, right eye, moderate stage: Secondary | ICD-10-CM | POA: Diagnosis not present

## 2017-04-07 DIAGNOSIS — H40022 Open angle with borderline findings, high risk, left eye: Secondary | ICD-10-CM | POA: Diagnosis not present

## 2017-05-06 DIAGNOSIS — Z23 Encounter for immunization: Secondary | ICD-10-CM | POA: Diagnosis not present

## 2017-05-15 DIAGNOSIS — H40053 Ocular hypertension, bilateral: Secondary | ICD-10-CM | POA: Diagnosis not present

## 2017-05-15 DIAGNOSIS — H401112 Primary open-angle glaucoma, right eye, moderate stage: Secondary | ICD-10-CM | POA: Diagnosis not present

## 2017-05-15 DIAGNOSIS — H40022 Open angle with borderline findings, high risk, left eye: Secondary | ICD-10-CM | POA: Diagnosis not present

## 2017-09-07 ENCOUNTER — Other Ambulatory Visit: Payer: Self-pay | Admitting: Family Medicine

## 2017-09-07 DIAGNOSIS — E119 Type 2 diabetes mellitus without complications: Secondary | ICD-10-CM

## 2017-09-08 ENCOUNTER — Telehealth: Payer: Self-pay | Admitting: Family Medicine

## 2017-09-08 DIAGNOSIS — I10 Essential (primary) hypertension: Secondary | ICD-10-CM

## 2017-09-10 ENCOUNTER — Encounter: Payer: Self-pay | Admitting: Family Medicine

## 2017-09-10 ENCOUNTER — Ambulatory Visit (INDEPENDENT_AMBULATORY_CARE_PROVIDER_SITE_OTHER): Payer: Medicare Other | Admitting: Family Medicine

## 2017-09-10 VITALS — BP 161/86 | HR 93 | Resp 18 | Ht 69.0 in | Wt 195.4 lb

## 2017-09-10 DIAGNOSIS — K219 Gastro-esophageal reflux disease without esophagitis: Secondary | ICD-10-CM | POA: Diagnosis not present

## 2017-09-10 DIAGNOSIS — Z23 Encounter for immunization: Secondary | ICD-10-CM | POA: Diagnosis not present

## 2017-09-10 DIAGNOSIS — I10 Essential (primary) hypertension: Secondary | ICD-10-CM

## 2017-09-10 DIAGNOSIS — Z794 Long term (current) use of insulin: Secondary | ICD-10-CM | POA: Diagnosis not present

## 2017-09-10 DIAGNOSIS — E119 Type 2 diabetes mellitus without complications: Secondary | ICD-10-CM

## 2017-09-10 DIAGNOSIS — L603 Nail dystrophy: Secondary | ICD-10-CM

## 2017-09-10 DIAGNOSIS — E785 Hyperlipidemia, unspecified: Secondary | ICD-10-CM | POA: Diagnosis not present

## 2017-09-10 MED ORDER — METFORMIN HCL 500 MG PO TABS: 500.0000 mg | ORAL_TABLET | Freq: Two times a day (BID) | ORAL | 1 refills | Status: DC

## 2017-09-10 NOTE — Progress Notes (Signed)
Subjective:  By signing my name below, I, Theresia Bough, attest that this documentation has been prepared under the direction and in the presence of Carlota Raspberry Ranell Patrick, MD.  Electronically Signed: Theresia Bough, Medical Scribe 09/10/17 at 2:05 PM  Patient was seen in Room 12   Patient ID: Gabriel Wu, male    DOB: 10-18-1940, 77 y.o.   MRN: [id number] Chief Complaint  Patient presents with  . Diabetes    6 month follow up  . Hypertension    6 month follow up    HPI Gabriel Wu is a 77 y.o. male who presents to Primary Care at [facility] for 6 month follow up for hypertension and DM. He reports he is doing well overall.    DM Lab Results  Component Value Date   HGBA1C 6.4 (H) 03/09/2017  His urine microalbumin 26.8 in January 2018. He was continued on metformin 500 mg BID for which he is compliant. He reports no side effects of diarrhea or constipation.  Foot screening performed today.  Diabetic Foot Exam - Simple   Simple Foot Form Visual Inspection No deformities, no ulcerations, no other skin breakdown bilaterally:  Yes Sensation Testing See comments:  Yes Pulse Check Posterior Tibialis and Dorsalis pulse intact bilaterally:  Yes Comments No feeling in 4, 5,6 bilateral   He states he saw a pediatrist within 1 year. He had an opthlo exam in Aug 2018 and another one on 09/15/17.    HTN Lab Results  Component Value Date   CREATININE 0.85 03/09/2017   BP Readings from Last 3 Encounters:  09/10/17 (!) 161/86  03/09/17 (!) 164/82  08/11/16 140/86   He takes Lisonpril 10 mg QD for which he is compliant. He has not missed any doses. He states he checks his BP at home with a wrist sleeve. Those readings have been okay. He reports rare dizziness when standing up quickly. He denies CP or SOB.  HLD Lab Results  Component Value Date   ALT 12 03/09/2017   AST 18 03/09/2017   ALKPHOS 75 03/09/2017   BILITOT 0.3 03/09/2017   Lab Results  Component Value  Date   CHOL 86 (L) 03/09/2017   HDL 37 (L) 03/09/2017   LDLCALC 28 03/09/2017   TRIG 106 03/09/2017   CHOLHDL 2.3 03/09/2017   He take Lipitor 40 mg QD for which he is compliant. He denies side effects of muscle aches. Option of lower lose discussed last visit but with DM he was continued on Statin. He denies any pain in his feet   GERD  He takes Prilosec 20 mg QD as needed. He did require it once per day when dicussed last year. Last visit, I gave him foods to avoid  Health maintenance  Cologuard was ordered last year and he states he has not received it. He is due for prevnar. He denies blood in stool, melena or any other complaints.    Patient Active Problem List   Diagnosis Date Noted  . DM (diabetes mellitus) ([place]) 05/12/2016  . Hyperlipemia   . Chest pain 04/12/2016  . Essential hypertension 04/12/2016  . Dyspepsia 04/12/2016   Past Medical History:  Diagnosis Date  . Urolithiasis    Past stone spontaneously per patient.   No past surgical history on file. No Known Allergies Prior to Admission medications   Medication Sig Start Date End Date Taking? Authorizing Provider  aspirin 81 MG EC tablet Take 1 tablet (81 mg total) by mouth daily. 08/11/16  Yes Forrest Moron, MD  atorvastatin (LIPITOR) 40 MG tablet Take 1 tablet (40 mg total) by mouth daily at 6 PM. 03/09/17  Yes Wendie Agreste, MD  lisinopril (PRINIVIL,ZESTRIL) 10 MG tablet Take 1 tablet (10 mg total) by mouth daily. 03/09/17  Yes Wendie Agreste, MD  metFORMIN (GLUCOPHAGE) 500 MG tablet Take 1 tablet (500 mg total) by mouth 2 (two) times daily with a meal. 03/09/17  Yes Wendie Agreste, MD  metFORMIN (GLUCOPHAGE) 500 MG tablet TAKE 1 TABLET BY MOUTH TWICE A DAY WITH A MEAL 09/07/17  Yes Wendie Agreste, MD  omeprazole (PRILOSEC) 20 MG capsule Take 1 capsule (20 mg total) by mouth daily. 03/09/17  Yes Wendie Agreste, MD   Social History   Socioeconomic History  . Marital status: Single    Spouse  name: Not on file  . Number of children: Not on file  . Years of education: Not on file  . Highest education level: Not on file  Social Needs  . Financial resource strain: Not on file  . Food insecurity - worry: Not on file  . Food insecurity - inability: Not on file  . Transportation needs - medical: Not on file  . Transportation needs - non-medical: Not on file  Occupational History  . Not on file  Tobacco Use  . Smoking status: Former Smoker    Years: 50.00    Types: Cigars    Last attempt to quit: 07/21/2010    Years since quitting: 7.1  . Smokeless tobacco: Never Used  Substance and Sexual Activity  . Alcohol use: No  . Drug use: No  . Sexual activity: No  Other Topics Concern  . Not on file  Social History Narrative  . Not on file      Review of Systems  Constitutional: Negative for fatigue and unexpected weight change.  Eyes: Negative for visual disturbance.  Respiratory: Negative for cough, chest tightness and shortness of breath.   Cardiovascular: Negative for chest pain, palpitations and leg swelling.  Gastrointestinal: Negative for abdominal pain, blood in stool, constipation and diarrhea.  Musculoskeletal: Negative for myalgias.  Neurological: Positive for dizziness (rare, with standing up). Negative for light-headedness and headaches.       Objective:   Physical Exam  Constitutional: He is oriented to person, place, and time. He appears well-developed and well-nourished.  HENT:  Head: Normocephalic and atraumatic.  Eyes: EOM are normal. Pupils are equal, round, and reactive to light.  Neck: No JVD present. Carotid bruit is not present.  Cardiovascular: Normal rate, regular rhythm and normal heart sounds.  No murmur heard. Pulmonary/Chest: Effort normal and breath sounds normal. He has no rales.  Musculoskeletal: He exhibits no edema.  Right foot: thinkend inward turned nail on the 3rd and 5th toes. Great toe nail is also thickened. Sensation in intact.  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  Left foot: thinkend nail on the 3rd, 4th and 5th toes with some turning  Neurological: He is alert and oriented to person, place, and time.  Skin: Skin is warm and dry.  Psychiatric: He has a normal mood and affect.  Vitals reviewed.    Vitals:   09/10/17 1332  BP: (!) 161/86  Pulse: 93  Resp: 18  SpO2: 97%  Weight: 195 lb 6.4 oz (88.6 kg)  Height: 5' 9"  (1.753 m)        Assessment & Plan:    Gabriel Wu is a 77 y.o. male Type 2  diabetes mellitus without complication, with long-term current use of insulin ([place]) - Plan: Hemoglobin A1c, Ambulatory referral to Podiatry  - Previously controlled. Continue same dose metformin, referred to podiatry for nail care/foot care. Check labs  Essential hypertension - Plan: lisinopril (PRINIVIL,ZESTRIL) 10 MG tablet  -Borderline elevated in office. possible orthostatic symptoms. No change in meds for now, monitor home readings and RTC precautions if elevated  Hyperlipidemia, unspecified hyperlipidemia type - Plan: Comprehensive metabolic panel, Lipid panel, atorvastatin (LIPITOR) 40 MG tablet  -Tolerating Lipitor, continue same dose, labs pending  Gastroesophageal reflux disease, esophagitis presence not specified  -Episodic PPI use, trigger avoidance  Need for prophylactic vaccination against Streptococcus pneumoniae (pneumococcus) - Plan: Pneumococcal conjugate vaccine 13-valent IM  Type 2 diabetes mellitus without complication, without long-term current use of insulin ([place]) - Plan: metFORMIN (GLUCOPHAGE) 500 MG tablet, Microalbumin, urine  -As above  Nail dystrophy - Plan: Ambulatory referral to Podiatry  -As above, inward twisting nails with some component 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  Meds ordered this encounter  Medications  . metFORMIN (GLUCOPHAGE) 500 MG tablet    Sig: Take 1 tablet (500 mg total) by mouth 2 (two) times daily with a meal.    Dispense:  180 tablet    Refill:  1  .  lisinopril (PRINIVIL,ZESTRIL) 10 MG tablet    Sig: Take 1 tablet (10 mg total) by mouth daily.    Dispense:  90 tablet    Refill:  1  . atorvastatin (LIPITOR) 40 MG tablet    Sig: Take 1 tablet (40 mg total) by mouth daily at 6 PM.    Dispense:  90 tablet    Refill:  1   Patient Instructions   I will refer you to podiatry for nail care and to examine feet. Sensation was decreased in some toes  - that can be related to diabetes or just thickened skin (callus).   Keep a record of your blood pressures outside of the office and bring them to the next office visit.  If readings over 140/90 - return to make changes.   No change in other medicines for now.   If diabetes is still controlled, follow up in 6 months.   Stand up slowly from seated position, and be careful leaning forward. Squatting down to pick up objects may be safer. If any further lightheadedness/dizziness, return to discuss those symptoms further.Return to the clinic or go to the nearest emergency room if any of your symptoms worsen or new symptoms occur.    Type 2 Diabetes Mellitus, Self Care, Adult When you have type 2 diabetes (type 2 diabetes mellitus), you must keep your blood sugar (glucose) under control. You can do this with:  Nutrition.  Exercise.  Lifestyle changes.  Medicines or insulin, if needed.  Support from your doctors and others.  How do I manage my blood sugar?  Check your blood sugar level every day, as often as told.  Call your doctor if your blood sugar is above your goal numbers for 2 tests in a row.  Have your A1c (hemoglobin A1c) level checked at least two times a year. Have it checked more often if your doctor tells you to. Your doctor will set treatment goals for you. Generally, you should have these blood sugar levels:  Before meals (preprandial): 80-130 mg/dL (4.4-7.2 mmol/L).  After meals (postprandial): lower than 180 mg/dL (10 mmol/L).  A1c level: less than 7%.  What do I  need to know about high blood sugar? High blood  sugar is called hyperglycemia. Know the signs of high blood sugar. Signs may include:  Feeling: ? Thirsty. ? Hungry. ? Very tired.  Needing to pee (urinate) more than usual.  Blurry vision.  What do I need to know about low blood sugar? Low blood sugar is called hypoglycemia. This is when blood sugar is at or below 70 mg/dL (3.9 mmol/L). Symptoms may include:  Feeling: ? Hungry. ? Worried or nervous (anxious). ? Sweaty and clammy. ? Confused. ? Dizzy. ? Sleepy. ? Sick to your stomach (nauseous).  Having: ? A fast heartbeat (palpitations). ? A headache. ? A change in your vision. ? Jerky movements that you cannot control (seizure). ? Nightmares. ? Tingling or no feeling (numbness) around the mouth, lips, or tongue.  Having trouble with: ? Talking. ? Paying attention (concentrating). ? Moving (coordination). ? Sleeping.  Shaking.  Passing out (fainting).  Getting upset easily (irritability).  Treating low blood sugar  To treat low blood sugar, eat or drink something sugary right away. If you can think clearly and swallow safely, follow the 15:15 rule:  Take 15 grams of a fast-acting carb (carbohydrate). Some fast-acting carbs are: ? 1 tube of glucose gel. ? 3 sugar tablets (glucose pills). ? 6-8 pieces of hard candy. ? 4 oz (120 mL) of fruit juice. ? 4 oz (120 mL) regular (not diet) soda.  Check your blood sugar 15 minutes after you take the carb.  If your blood sugar is still at or below 70 mg/dL (3.9 mmol/L), take 15 grams of a carb again.  If your blood sugar does not go above 70 mg/dL (3.9 mmol/L) after 3 tries, get help right away.  After your blood sugar goes back to normal, eat a meal or a snack within 1 hour.  Treating very low blood sugar If your blood sugar is at or below 54 mg/dL (3 mmol/L), you have very low blood sugar (severe hypoglycemia). This is an emergency. Do not wait to see if the  symptoms will go away. Get medical help right away. Call your local emergency services (911 in the [place] Do not drive yourself to the hospital. If you have very low blood sugar and you cannot eat or drink, you may need a glucagon shot (injection). A family member or friend should learn how to check your blood sugar and how to give you a glucagon shot. Ask your doctor if you need to have a glucagon shot kit at home. What else is important to manage my diabetes? Medicine Follow these instructions about insulin and diabetes medicines:  Take them as told by your doctor.  Adjust them as told by your doctor.  Do not run out of them.  Having diabetes can raise your risk for other long-term conditions. These include heart or kidney disease. Your doctor may prescribe medicines to help prevent problems from diabetes. Food   Make healthy food choices. These include: ? Chicken, fish, egg whites, and beans. ? Oats, whole wheat, bulgur, brown rice, quinoa, and millet. ? Fresh fruits and vegetables. ? Low-fat dairy products. ? Nuts, avocado, olive oil, and canola oil.  Make a food plan with a specialist (dietitian).  Follow instructions from your doctor about what you cannot eat or drink.  Drink enough fluid to keep your pee (urine) clear or pale yellow.  Eat healthy snacks between healthy meals.  Keep track of carbs that you eat. Read food labels. Learn food serving sizes.  Follow your sick day plan when you cannot  eat or drink normally. Make this plan with your doctor so it is ready to use. Activity  Exercise at least 3 times a week.  Do not go more than 2 days without exercising.  Talk with your doctor before you start a new exercise. Your doctor may need to adjust your insulin, medicines, or food. Lifestyle   Do not use any tobacco products. These include cigarettes, chewing tobacco, and e-cigarettes.If you need help quitting, ask your doctor.  Ask your doctor how much alcohol is  safe for you.  Learn to deal with stress. If you need help with this, ask your doctor. Body care  Stay up to date with your shots (immunizations).  Have your eyes and feet checked by a doctor as often as told.  Check your skin and feet every day. Check for cuts, bruises, redness, blisters, or sores.  Brush your teeth and gums two times a day.  Floss at least one time a day.  Go to the dentist least one time every 6 months.  Stay at a healthy weight. General instructions   Take over-the-counter and prescription medicines only as told by your doctor.  Share your diabetes care plan with: ? Your work or school. ? People you live with.  Check your pee (urine) for ketones: ? When you are sick. ? As told by your doctor.  Carry a card or wear jewelry that says that you have diabetes.  Ask your doctor: ? Do I need to meet with a diabetes educator? ? Where can I find a support group for people with diabetes?  Keep all follow-up visits as told by your doctor. This is important. Where to find more information: To learn more about diabetes, visit:  American Diabetes Association: www.diabetes.org  American Association of Diabetes Educators: www.diabeteseducator.org/patient-resources  This information is not intended to replace advice given to you by your health care provider. Make sure you discuss any questions you have with your health care provider. Document Released: 10/29/2015 Document Revised: 12/13/2015 Document Reviewed: 08/10/2015 Elsevier Interactive Patient Education  2018 [employer].   IF you received an x-ray today, you will receive an invoice from [facility]. Please contact [facility] at [phone] with questions or concerns regarding your invoice.   IF you received labwork today, you will receive an invoice from [facility]. Please contact [facility] at [phone] with questions or concerns regarding your invoice.   Our billing staff will  not be able to assist you with questions regarding bills from these companies.  You will be contacted with the lab results as soon as they are available. The fastest way to get your results is to activate your My Chart account. Instructions are located on the last page of this paperwork. If you have not heard from [place] regarding the results in 2 weeks, please contact this office.      I personally performed the services described in this documentation, which was scribed in my presence. The recorded information has been reviewed and considered for accuracy and completeness, addended by me as needed, and agree with information above.  Signed,   Merri Ray, MD Primary [facility] at [place].  09/12/17 12:49 PM

## 2017-09-10 NOTE — Patient Instructions (Addendum)
I will refer you to podiatry for nail care and to examine feet. Sensation was decreased in some toes  - that can be related to diabetes or just thickened skin (callus).   Keep a record of your blood pressures outside of the office and bring them to the next office visit.  If readings over 140/90 - return to make changes.   No change in other medicines for now.   If diabetes is still controlled, follow up in 6 months.   Stand up slowly from seated position, and be careful leaning forward. Squatting down to pick up objects may be safer. If any further lightheadedness/dizziness, return to discuss those symptoms further.Return to the clinic or go to the nearest emergency room if any of your symptoms worsen or new symptoms occur.    Type 2 Diabetes Mellitus, Self Care, Adult When you have type 2 diabetes (type 2 diabetes mellitus), you must keep your blood sugar (glucose) under control. You can do this with:  Nutrition.  Exercise.  Lifestyle changes.  Medicines or insulin, if needed.  Support from your doctors and others.  How do I manage my blood sugar?  Check your blood sugar level every day, as often as told.  Call your doctor if your blood sugar is above your goal numbers for 2 tests in a row.  Have your A1c (hemoglobin A1c) level checked at least two times a year. Have it checked more often if your doctor tells you to. Your doctor will set treatment goals for you. Generally, you should have these blood sugar levels:  Before meals (preprandial): 80-130 mg/dL (4.4-7.2 mmol/L).  After meals (postprandial): lower than 180 mg/dL (10 mmol/L).  A1c level: less than 7%.  What do I need to know about high blood sugar? High blood sugar is called hyperglycemia. Know the signs of high blood sugar. Signs may include:  Feeling: ? Thirsty. ? Hungry. ? Very tired.  Needing to pee (urinate) more than usual.  Blurry vision.  What do I need to know about low blood sugar? Low blood  sugar is called hypoglycemia. This is when blood sugar is at or below 70 mg/dL (3.9 mmol/L). Symptoms may include:  Feeling: ? Hungry. ? Worried or nervous (anxious). ? Sweaty and clammy. ? Confused. ? Dizzy. ? Sleepy. ? Sick to your stomach (nauseous).  Having: ? A fast heartbeat (palpitations). ? A headache. ? A change in your vision. ? Jerky movements that you cannot control (seizure). ? Nightmares. ? Tingling or no feeling (numbness) around the mouth, lips, or tongue.  Having trouble with: ? Talking. ? Paying attention (concentrating). ? Moving (coordination). ? Sleeping.  Shaking.  Passing out (fainting).  Getting upset easily (irritability).  Treating low blood sugar  To treat low blood sugar, eat or drink something sugary right away. If you can think clearly and swallow safely, follow the 15:15 rule:  Take 15 grams of a fast-acting carb (carbohydrate). Some fast-acting carbs are: ? 1 tube of glucose gel. ? 3 sugar tablets (glucose pills). ? 6-8 pieces of hard candy. ? 4 oz (120 mL) of fruit juice. ? 4 oz (120 mL) regular (not diet) soda.  Check your blood sugar 15 minutes after you take the carb.  If your blood sugar is still at or below 70 mg/dL (3.9 mmol/L), take 15 grams of a carb again.  If your blood sugar does not go above 70 mg/dL (3.9 mmol/L) after 3 tries, get help right away.  After your blood sugar  goes back to normal, eat a meal or a snack within 1 hour.  Treating very low blood sugar If your blood sugar is at or below 54 mg/dL (3 mmol/L), you have very low blood sugar (severe hypoglycemia). This is an emergency. Do not wait to see if the symptoms will go away. Get medical help right away. Call your local emergency services (911 in the [place] Do not drive yourself to the hospital. If you have very low blood sugar and you cannot eat or drink, you may need a glucagon shot (injection). A family member or friend should learn how to check your blood  sugar and how to give you a glucagon shot. Ask your doctor if you need to have a glucagon shot kit at home. What else is important to manage my diabetes? Medicine Follow these instructions about insulin and diabetes medicines:  Take them as told by your doctor.  Adjust them as told by your doctor.  Do not run out of them.  Having diabetes can raise your risk for other long-term conditions. These include heart or kidney disease. Your doctor may prescribe medicines to help prevent problems from diabetes. Food   Make healthy food choices. These include: ? Chicken, fish, egg whites, and beans. ? Oats, whole wheat, bulgur, brown rice, quinoa, and millet. ? Fresh fruits and vegetables. ? Low-fat dairy products. ? Nuts, avocado, olive oil, and canola oil.  Make a food plan with a specialist (dietitian).  Follow instructions from your doctor about what you cannot eat or drink.  Drink enough fluid to keep your pee (urine) clear or pale yellow.  Eat healthy snacks between healthy meals.  Keep track of carbs that you eat. Read food labels. Learn food serving sizes.  Follow your sick day plan when you cannot eat or drink normally. Make this plan with your doctor so it is ready to use. Activity  Exercise at least 3 times a week.  Do not go more than 2 days without exercising.  Talk with your doctor before you start a new exercise. Your doctor may need to adjust your insulin, medicines, or food. Lifestyle   Do not use any tobacco products. These include cigarettes, chewing tobacco, and e-cigarettes.If you need help quitting, ask your doctor.  Ask your doctor how much alcohol is safe for you.  Learn to deal with stress. If you need help with this, ask your doctor. Body care  Stay up to date with your shots (immunizations).  Have your eyes and feet checked by a doctor as often as told.  Check your skin and feet every day. Check for cuts, bruises, redness, blisters, or  sores.  Brush your teeth and gums two times a day.  Floss at least one time a day.  Go to the dentist least one time every 6 months.  Stay at a healthy weight. General instructions   Take over-the-counter and prescription medicines only as told by your doctor.  Share your diabetes care plan with: ? Your work or school. ? People you live with.  Check your pee (urine) for ketones: ? When you are sick. ? As told by your doctor.  Carry a card or wear jewelry that says that you have diabetes.  Ask your doctor: ? Do I need to meet with a diabetes educator? ? Where can I find a support group for people with diabetes?  Keep all follow-up visits as told by your doctor. This is important. Where to find more information: To learn  more about diabetes, visit:  American Diabetes Association: www.diabetes.org  American Association of Diabetes Educators: www.diabeteseducator.org/patient-resources  This information is not intended to replace advice given to you by your health care provider. Make sure you discuss any questions you have with your health care provider. Document Released: 10/29/2015 Document Revised: 12/13/2015 Document Reviewed: 08/10/2015 Elsevier Interactive Patient Education  2018 [employer].   IF you received an x-ray today, you will receive an invoice from [facility]. Please contact [facility] at [phone] with questions or concerns regarding your invoice.   IF you received labwork today, you will receive an invoice from [facility]. Please contact [facility] at [phone] with questions or concerns regarding your invoice.   Our billing staff will not be able to assist you with questions regarding bills from these companies.  You will be contacted with the lab results as soon as they are available. The fastest way to get your results is to activate your My Chart account. Instructions are located on the last page of this paperwork. If you have  not heard from [place] regarding the results in 2 weeks, please contact this office.

## 2017-09-11 LAB — COMPREHENSIVE METABOLIC PANEL
A/G RATIO: 1.5 (ref 1.2–2.2)
ALBUMIN: 4.7 g/dL (ref 3.5–4.8)
ALT: 21 IU/L (ref 0–44)
AST: 16 IU/L (ref 0–40)
Alkaline Phosphatase: 88 IU/L (ref 39–117)
BILIRUBIN TOTAL: 0.4 mg/dL (ref 0.0–1.2)
BUN / CREAT RATIO: 15 (ref 10–24)
BUN: 14 mg/dL (ref 8–27)
CHLORIDE: 99 mmol/L (ref 96–106)
CO2: 20 mmol/L (ref 20–29)
Calcium: 9.9 mg/dL (ref 8.6–10.2)
Creatinine, Ser: 0.96 mg/dL (ref 0.76–1.27)
GFR calc non Af Amer: 76 mL/min/{1.73_m2} (ref >59)
GFR, EST AFRICAN AMERICAN: 88 mL/min/{1.73_m2} (ref >59)
GLUCOSE: 106 mg/dL — AB (ref 65–99)
Globulin, Total: 3.1 g/dL (ref 1.5–4.5)
Potassium: 4.4 mmol/L (ref 3.5–5.2)
Sodium: 138 mmol/L (ref 134–144)
TOTAL PROTEIN: 7.8 g/dL (ref 6.0–8.5)

## 2017-09-11 LAB — LIPID PANEL
CHOL/HDL RATIO: 2.5 ratio (ref 0.0–5.0)
Cholesterol, Total: 103 mg/dL (ref 100–199)
HDL: 42 mg/dL (ref >39)
LDL Calculated: 36 mg/dL (ref 0–99)
Triglycerides: 124 mg/dL (ref 0–149)
VLDL CHOLESTEROL CAL: 25 mg/dL (ref 5–40)

## 2017-09-11 LAB — MICROALBUMIN, URINE: Microalbumin, Urine: 36.5 ug/mL

## 2017-09-11 LAB — HEMOGLOBIN A1C
ESTIMATED AVERAGE GLUCOSE: 137 mg/dL
HEMOGLOBIN A1C: 6.4 % — AB (ref 4.8–5.6)

## 2017-09-12 MED ORDER — ATORVASTATIN CALCIUM 40 MG PO TABS: 40.0000 mg | ORAL_TABLET | Freq: Every day | ORAL | 1 refills | Status: DC

## 2017-09-12 MED ORDER — LISINOPRIL 10 MG PO TABS: 10.0000 mg | ORAL_TABLET | Freq: Every day | ORAL | 1 refills | Status: DC

## 2017-09-15 DIAGNOSIS — H401112 Primary open-angle glaucoma, right eye, moderate stage: Secondary | ICD-10-CM | POA: Diagnosis not present

## 2017-09-15 DIAGNOSIS — H40053 Ocular hypertension, bilateral: Secondary | ICD-10-CM | POA: Diagnosis not present

## 2017-09-15 DIAGNOSIS — H40022 Open angle with borderline findings, high risk, left eye: Secondary | ICD-10-CM | POA: Diagnosis not present

## 2017-09-18 NOTE — Telephone Encounter (Signed)
Patient states pharmacy has not received. [facility] on [address]. Call pt back at [phone]

## 2017-09-19 NOTE — Telephone Encounter (Signed)
Spoke with pharmacy.  Pt has picked up rx 09/19/2017

## 2017-10-30 ENCOUNTER — Ambulatory Visit: Payer: Medicare Other | Admitting: Podiatry

## 2017-12-09 ENCOUNTER — Other Ambulatory Visit: Payer: Self-pay | Admitting: Family Medicine

## 2017-12-09 DIAGNOSIS — E119 Type 2 diabetes mellitus without complications: Secondary | ICD-10-CM

## 2017-12-18 LAB — HM DIABETES EYE EXAM

## 2018-01-29 DIAGNOSIS — H401112 Primary open-angle glaucoma, right eye, moderate stage: Secondary | ICD-10-CM | POA: Diagnosis not present

## 2018-01-29 DIAGNOSIS — H40022 Open angle with borderline findings, high risk, left eye: Secondary | ICD-10-CM | POA: Diagnosis not present

## 2018-01-29 DIAGNOSIS — H2513 Age-related nuclear cataract, bilateral: Secondary | ICD-10-CM | POA: Diagnosis not present

## 2018-01-29 DIAGNOSIS — E119 Type 2 diabetes mellitus without complications: Secondary | ICD-10-CM | POA: Diagnosis not present

## 2018-01-29 LAB — HM DIABETES EYE EXAM

## 2018-02-04 ENCOUNTER — Encounter: Payer: Self-pay | Admitting: *Deleted

## 2018-03-09 ENCOUNTER — Other Ambulatory Visit: Payer: Self-pay

## 2018-03-09 ENCOUNTER — Ambulatory Visit (INDEPENDENT_AMBULATORY_CARE_PROVIDER_SITE_OTHER): Payer: Medicare Other | Admitting: Family Medicine

## 2018-03-09 ENCOUNTER — Encounter: Payer: Self-pay | Admitting: Family Medicine

## 2018-03-09 VITALS — BP 150/80 | HR 88 | Temp 98.7°F | Ht 69.0 in | Wt 199.6 lb

## 2018-03-09 DIAGNOSIS — E119 Type 2 diabetes mellitus without complications: Secondary | ICD-10-CM | POA: Diagnosis not present

## 2018-03-09 DIAGNOSIS — E785 Hyperlipidemia, unspecified: Secondary | ICD-10-CM | POA: Diagnosis not present

## 2018-03-09 DIAGNOSIS — I10 Essential (primary) hypertension: Secondary | ICD-10-CM

## 2018-03-09 MED ORDER — LISINOPRIL 20 MG PO TABS: 20.0000 mg | ORAL_TABLET | Freq: Every day | ORAL | 1 refills | Status: DC

## 2018-03-09 MED ORDER — ATORVASTATIN CALCIUM 40 MG PO TABS: 40.0000 mg | ORAL_TABLET | Freq: Every day | ORAL | 1 refills | Status: DC

## 2018-03-09 MED ORDER — METFORMIN HCL 500 MG PO TABS: 500.0000 mg | ORAL_TABLET | Freq: Two times a day (BID) | ORAL | 1 refills | Status: DC

## 2018-03-09 NOTE — Progress Notes (Signed)
Subjective:  By signing my name below, I, Gabriel Wu, attest that this documentation has been prepared under the direction and in the presence of Gabriel FloodJeffrey Wu Disa Riedlinger, MD Electronically Signed: Charline BillsEssence Wu, ED Scribe 03/09/2018 at 1:46 PM.   Patient ID: Gabriel Wu, male    DOB: 1941/02/23, 77 y.o.   MRN: [id number]  Chief Complaint  Patient presents with  . Diabetes    6 m check up    HPI Gabriel Wu is a 77 y.o. male who presents to Primary [facility] at [facility] for f/u. Pt is not fasting at this visit; had a bowl of cereal around 11:30 AM today.  DM Lab Results  Component Value Date   HGBA1C 6.4 (H) 09/10/2017  Foot screening last OV in Jan. Metformin 500 mg bid. Continued same dose. On Statin, ACE and asa. Referred to podiatry for nail and foot care for multiple thickened and overgrown nails. Optho 01/29/18. Urine micro was 36.5 in Feb. Completed pneumonia vaccine. - Denies diarrhea, stomach upset, any side-effects.  HTN BP Readings from Last 3 Encounters:  03/09/18 (!) 150/80  09/10/17 (!) 161/86  03/09/17 (!) 164/82   Lab Results  Component Value Date   CREATININE 0.96 09/10/2017  Continued Lisinopril 10 mg qd but borderline last OV. Recommended home readings and RTC precautions. - Pt reports home systolic readings averaging 145. Denies cp, sob, lightheadedness, dizziness, any other symptoms.  Hyperlipidemia Lab Results  Component Value Date   CHOL 103 09/10/2017   HDL 42 09/10/2017   LDLCALC 36 09/10/2017   TRIG 124 09/10/2017   CHOLHDL 2.5 09/10/2017   Lab Results  Component Value Date   ALT 21 09/10/2017   AST 16 09/10/2017   ALKPHOS 88 09/10/2017   BILITOT 0.4 09/10/2017  Continued on Lipitor same dose as LDL under 70. - Denies myalgias, arthralgias, any new side-effects.  Patient Active Problem List   Diagnosis Date Noted  . DM (diabetes mellitus) (HCC) 05/12/2016  . Hyperlipemia   . Chest pain 04/12/2016  . Essential hypertension 04/12/2016    . Dyspepsia 04/12/2016   Past Medical History:  Diagnosis Date  . Urolithiasis    Past stone spontaneously per patient.   No past surgical history on file. No Known Allergies Prior to Admission medications   Medication Sig Start Date End Date Taking? Authorizing Provider  aspirin 81 MG EC tablet Take 1 tablet (81 mg total) by mouth daily. 08/11/16   Doristine BosworthStallings, Gabriel A, MD  atorvastatin (LIPITOR) 40 MG tablet Take 1 tablet (40 mg total) by mouth daily at 6 PM. 09/12/17   Gabriel Wu, Gabriel Million R, MD  lisinopril (PRINIVIL,ZESTRIL) 10 MG tablet Take 1 tablet (10 mg total) by mouth daily. 09/12/17   Gabriel Wu, Gabriel Gindlesperger R, MD  metFORMIN (GLUCOPHAGE) 500 MG tablet Take 1 tablet (500 mg total) by mouth 2 (two) times daily with a meal. 09/10/17   Gabriel Wu, Gabriel Erdmann R, MD  metFORMIN (GLUCOPHAGE) 500 MG tablet TAKE 1 TABLET BY MOUTH TWICE A DAY WITH A MEAL 12/09/17   Gabriel Wu, Gabriel Klemz R, MD  omeprazole (PRILOSEC) 20 MG capsule Take 1 capsule (20 mg total) by mouth daily. 03/09/17   Gabriel Wu, Gabriel Kallstrom R, MD   Social History   Socioeconomic History  . Marital status: Single    Spouse name: Not on file  . Number of children: Not on file  . Years of education: Not on file  . Highest education level: Not on file  Occupational History  . Not on file  Social Needs  .  Financial resource strain: Not on file  . Food insecurity:    Worry: Not on file    Inability: Not on file  . Transportation needs:    Medical: Not on file    Non-medical: Not on file  Tobacco Use  . Smoking status: Former Smoker    Years: 50.00    Types: Cigars    Last attempt to quit: 07/21/2010    Years since quitting: 7.6  . Smokeless tobacco: Never Used  Substance and Sexual Activity  . Alcohol use: No  . Drug use: No  . Sexual activity: Never  Lifestyle  . Physical activity:    Days per week: Not on file    Minutes per session: Not on file  . Stress: Not on file  Relationships  . Social connections:    Talks on phone: Not on file     Gets together: Not on file    Attends religious service: Not on file    Active member of club or organization: Not on file    Attends meetings of clubs or organizations: Not on file    Relationship status: Not on file  . Intimate partner violence:    Fear of current or ex partner: Not on file    Emotionally abused: Not on file    Physically abused: Not on file    Forced sexual activity: Not on file  Other Topics Concern  . Not on file  Social History Narrative  . Not on file   Review of Systems  Constitutional: Negative for fatigue and unexpected weight change.  Eyes: Negative for visual disturbance.  Respiratory: Negative for cough, chest tightness and shortness of breath.   Cardiovascular: Negative for chest pain, palpitations and leg swelling.  Gastrointestinal: Negative for abdominal pain, blood in stool and diarrhea.  Musculoskeletal: Negative for arthralgias and myalgias.  Neurological: Negative for dizziness, light-headedness and headaches.      Objective:   Physical Exam  Constitutional: He is oriented to person, place, and time. He appears well-developed and well-nourished.  HENT:  Head: Normocephalic and atraumatic.  Eyes: Pupils are equal, round, and reactive to light. EOM are normal.  Neck: No JVD present. Carotid bruit is not present.  Cardiovascular: Normal rate, regular rhythm and normal heart sounds.  No murmur heard. Pulmonary/Chest: Effort normal and breath sounds normal. He has no rales.  Musculoskeletal: He exhibits no edema.  Neurological: He is alert and oriented to person, place, and time.  Skin: Skin is warm and dry.  Psychiatric: He has a normal mood and affect.  Vitals reviewed.  Vitals:   03/09/18 1333 03/09/18 1337  BP: (!) 175/95 (!) 150/80  Pulse: 88   Temp: 98.7 F (37.1 C)   TempSrc: Oral   SpO2: 95%   Weight: 199 lb 9.6 oz (90.5 kg)   Height: 5\' 9"  (1.753 m)       Assessment & Plan:   Gabriel Wu is a 77 y.o. male Type 2  diabetes mellitus without complication, without long-term current use of insulin (HCC) - Plan: Hemoglobin A1c, metFORMIN (GLUCOPHAGE) 500 MG tablet  -Tolerating current dose of metformin, last A1c controlled.  Continue same dose, recheck labs, repeat office visit 6 months  Essential hypertension - Plan: Comprehensive metabolic panel, lisinopril (PRINIVIL,ZESTRIL) 20 MG tablet  -Uncontrolled, high readings today.  Increase lisinopril to 20 mg daily, potential side effects and risks discussed, follow-up for lab only visit in 6 weeks to recheck creatinine.   Hyperlipidemia, unspecified hyperlipidemia type -  Plan: Lipid panel, Comprehensive metabolic panel, atorvastatin (LIPITOR) 40 MG tablet  -Tolerating Lipitor, continue same dose, labs pending.  Not truly fasting today, if elevated readings, can recheck with labs in 6 weeks.  Well controlled previously.   Meds ordered this encounter  Medications  . atorvastatin (LIPITOR) 40 MG tablet    Sig: Take 1 tablet (40 mg total) by mouth daily at 6 PM.    Dispense:  90 tablet    Refill:  1  . metFORMIN (GLUCOPHAGE) 500 MG tablet    Sig: Take 1 tablet (500 mg total) by mouth 2 (two) times daily with a meal.    Dispense:  180 tablet    Refill:  1  . lisinopril (PRINIVIL,ZESTRIL) 20 MG tablet    Sig: Take 1 tablet (20 mg total) by mouth daily.    Dispense:  90 tablet    Refill:  1   Patient Instructions    Thank you for coming in today.  No change in cholesterol or diabetes medicine at this time, but blood pressure is running too high.  Increase lisinopril to 20 mg/day.  Watch for lightheadedness or dizziness especially when first standing up with that medication.  Follow-up in 6 months for office visit, but please return in 6 weeks to recheck kidney function test with the adjustment of that medicine.  That can be a lab only visit.   If you have lab work done today you will be contacted with your lab results within the next 2 weeks.  If you have not  heard from [place] then please contact us. The fastest way to get your results is to register for My Chart.   IF you received an x-ray today, you will receive an invoice from [facility]. Please contact [facility] at [phone] with questions or concerns regarding your invoice.   IF you received labwork today, you will receive an invoice from [place]. Please contact [facility] at [phone] with questions or concerns regarding your invoice.   Our billing staff will not be able to assist you with questions regarding bills from these companies.  You will be contacted with the lab results as soon as they are available. The fastest way to get your results is to activate your My Chart account. Instructions are located on the last page of this paperwork. If you have not heard from [place] regarding the results in 2 weeks, please contact this office.       I personally performed the services described in this documentation, which was scribed in my presence. The recorded information has been reviewed and considered for accuracy and completeness, addended by me as needed, and agree with information above.  Signed,   Meredith Staggers, MD Primary Care at [facility].  03/09/18 1:57 PM

## 2018-03-09 NOTE — Patient Instructions (Addendum)
  Thank you for coming in today.  No change in cholesterol or diabetes medicine at this time, but blood pressure is running too high.  Increase lisinopril to 20 mg/day.  Watch for lightheadedness or dizziness especially when first standing up with that medication.  Follow-up in 6 months for office visit, but please return in 6 weeks to recheck kidney function test with the adjustment of that medicine.  That can be a lab only visit.   If you have lab work done today you will be contacted with your lab results within the next 2 weeks.  If you have not heard from us then please contact us. The fastest way to get your results is to register for My Chart.   IF you received an x-ray today, you will receive an invoice from [facility]. Please contact [facility] at [phone] with questions or concerns regarding your invoice.   IF you received labwork today, you will receive an invoice from [facility]. Please contact [facility] at [phone] with questions or concerns regarding your invoice.   Our billing staff will not be able to assist you with questions regarding bills from these companies.  You will be contacted with the lab results as soon as they are available. The fastest way to get your results is to activate your My Chart account. Instructions are located on the last page of this paperwork. If you have not heard from us regarding the results in 2 weeks, please contact this office.

## 2018-03-09 NOTE — Addendum Note (Signed)
Addended by: Meredith StaggersGREENE, Irianna Gilday R on: 03/09/2018 02:08 PM   Modules accepted: Orders

## 2018-03-10 LAB — LIPID PANEL
CHOL/HDL RATIO: 2.7 ratio (ref 0.0–5.0)
Cholesterol, Total: 103 mg/dL (ref 100–199)
HDL: 38 mg/dL — AB (ref >39)
LDL CALC: 42 mg/dL (ref 0–99)
Triglycerides: 117 mg/dL (ref 0–149)
VLDL Cholesterol Cal: 23 mg/dL (ref 5–40)

## 2018-03-10 LAB — COMPREHENSIVE METABOLIC PANEL
ALK PHOS: 91 IU/L (ref 39–117)
ALT: 21 IU/L (ref 0–44)
AST: 24 IU/L (ref 0–40)
Albumin/Globulin Ratio: 1.5 (ref 1.2–2.2)
Albumin: 4.8 g/dL (ref 3.5–4.8)
BILIRUBIN TOTAL: 0.4 mg/dL (ref 0.0–1.2)
BUN/Creatinine Ratio: 14 (ref 10–24)
BUN: 15 mg/dL (ref 8–27)
CHLORIDE: 99 mmol/L (ref 96–106)
CO2: 24 mmol/L (ref 20–29)
Calcium: 10.4 mg/dL — ABNORMAL HIGH (ref 8.6–10.2)
Creatinine, Ser: 1.04 mg/dL (ref 0.76–1.27)
GFR calc non Af Amer: 69 mL/min/{1.73_m2} (ref >59)
GFR, EST AFRICAN AMERICAN: 80 mL/min/{1.73_m2} (ref >59)
GLUCOSE: 101 mg/dL — AB (ref 65–99)
Globulin, Total: 3.3 g/dL (ref 1.5–4.5)
Potassium: 4.4 mmol/L (ref 3.5–5.2)
Sodium: 138 mmol/L (ref 134–144)
TOTAL PROTEIN: 8.1 g/dL (ref 6.0–8.5)

## 2018-03-10 LAB — HEMOGLOBIN A1C
ESTIMATED AVERAGE GLUCOSE: 140 mg/dL
HEMOGLOBIN A1C: 6.5 % — AB (ref 4.8–5.6)

## 2018-03-24 ENCOUNTER — Encounter: Payer: Self-pay | Admitting: *Deleted

## 2018-04-20 ENCOUNTER — Ambulatory Visit (INDEPENDENT_AMBULATORY_CARE_PROVIDER_SITE_OTHER): Payer: Medicare Other | Admitting: Family Medicine

## 2018-04-20 DIAGNOSIS — I10 Essential (primary) hypertension: Secondary | ICD-10-CM | POA: Diagnosis not present

## 2018-04-20 LAB — BASIC METABOLIC PANEL
BUN/Creatinine Ratio: 16 (ref 10–24)
BUN: 16 mg/dL (ref 8–27)
CHLORIDE: 98 mmol/L (ref 96–106)
CO2: 25 mmol/L (ref 20–29)
Calcium: 9.8 mg/dL (ref 8.6–10.2)
Creatinine, Ser: 1.02 mg/dL (ref 0.76–1.27)
GFR calc Af Amer: 82 mL/min/{1.73_m2} (ref >59)
GFR calc non Af Amer: 71 mL/min/{1.73_m2} (ref >59)
GLUCOSE: 124 mg/dL — AB (ref 65–99)
POTASSIUM: 4 mmol/L (ref 3.5–5.2)
Sodium: 136 mmol/L (ref 134–144)

## 2018-04-20 NOTE — Progress Notes (Signed)
Lab only visit 

## 2018-04-26 ENCOUNTER — Encounter: Payer: Self-pay | Admitting: *Deleted

## 2018-05-13 ENCOUNTER — Other Ambulatory Visit: Payer: Self-pay | Admitting: Family Medicine

## 2018-05-13 ENCOUNTER — Telehealth: Payer: Self-pay | Admitting: Family Medicine

## 2018-05-13 DIAGNOSIS — E785 Hyperlipidemia, unspecified: Secondary | ICD-10-CM

## 2018-05-13 MED ORDER — ATORVASTATIN CALCIUM 40 MG PO TABS: 40.0000 mg | ORAL_TABLET | Freq: Every day | ORAL | 2 refills | Status: DC

## 2018-05-13 NOTE — Telephone Encounter (Signed)
Copied from CRM [id number]. Topic: Quick Communication - Rx Refill/Question >> May 13, 2018  2:58 PM Jaquita Rector A wrote: Medication: omeprazole (PRILOSEC) 20 MG capsule,   atorvastatin (LIPITOR) 40 MG tablet  Has the patient contacted their pharmacy? Yes.      Preferred Pharmacy (with phone number or street name): [facility] [place], [place] - [address] [phone] (Phone) [phone] (Fax)    Agent: Please be advised that RX refills may take up to 3 business days. We ask that you follow-up with your pharmacy.

## 2018-05-17 ENCOUNTER — Other Ambulatory Visit: Payer: Self-pay | Admitting: *Deleted

## 2018-05-17 DIAGNOSIS — K219 Gastro-esophageal reflux disease without esophagitis: Secondary | ICD-10-CM

## 2018-05-17 MED ORDER — OMEPRAZOLE 20 MG PO CPDR: 20.0000 mg | DELAYED_RELEASE_CAPSULE | Freq: Every day | ORAL | 0 refills | Status: DC

## 2018-05-17 NOTE — Telephone Encounter (Signed)
Pt called and stated that omeprazole has not been called in. Please advise. [id number]#[phone]

## 2018-06-19 DIAGNOSIS — Z23 Encounter for immunization: Secondary | ICD-10-CM | POA: Diagnosis not present

## 2018-07-30 DIAGNOSIS — H40022 Open angle with borderline findings, high risk, left eye: Secondary | ICD-10-CM | POA: Diagnosis not present

## 2018-07-30 DIAGNOSIS — H401112 Primary open-angle glaucoma, right eye, moderate stage: Secondary | ICD-10-CM | POA: Diagnosis not present

## 2018-09-07 ENCOUNTER — Ambulatory Visit (INDEPENDENT_AMBULATORY_CARE_PROVIDER_SITE_OTHER): Payer: Medicare Other | Admitting: Family Medicine

## 2018-09-07 ENCOUNTER — Encounter: Payer: Self-pay | Admitting: Family Medicine

## 2018-09-07 ENCOUNTER — Other Ambulatory Visit: Payer: Self-pay

## 2018-09-07 VITALS — BP 170/90 | HR 81 | Temp 98.8°F | Resp 14 | Ht 69.0 in | Wt 201.4 lb

## 2018-09-07 DIAGNOSIS — R809 Proteinuria, unspecified: Secondary | ICD-10-CM | POA: Diagnosis not present

## 2018-09-07 DIAGNOSIS — I10 Essential (primary) hypertension: Secondary | ICD-10-CM

## 2018-09-07 DIAGNOSIS — E1129 Type 2 diabetes mellitus with other diabetic kidney complication: Secondary | ICD-10-CM | POA: Diagnosis not present

## 2018-09-07 DIAGNOSIS — K219 Gastro-esophageal reflux disease without esophagitis: Secondary | ICD-10-CM

## 2018-09-07 DIAGNOSIS — E785 Hyperlipidemia, unspecified: Secondary | ICD-10-CM

## 2018-09-07 DIAGNOSIS — E119 Type 2 diabetes mellitus without complications: Secondary | ICD-10-CM | POA: Diagnosis not present

## 2018-09-07 LAB — GLUCOSE, POCT (MANUAL RESULT ENTRY): POC GLUCOSE: 125 mg/dL — AB (ref 70–99)

## 2018-09-07 LAB — POCT GLYCOSYLATED HEMOGLOBIN (HGB A1C): HEMOGLOBIN A1C: 6.4 % — AB (ref 4.0–5.6)

## 2018-09-07 MED ORDER — LISINOPRIL-HYDROCHLOROTHIAZIDE 20-12.5 MG PO TABS: 1.0000 | ORAL_TABLET | Freq: Every day | ORAL | 1 refills | Status: DC

## 2018-09-07 MED ORDER — METFORMIN HCL 500 MG PO TABS: 500.0000 mg | ORAL_TABLET | Freq: Two times a day (BID) | ORAL | 1 refills | Status: DC

## 2018-09-07 MED ORDER — OMEPRAZOLE 20 MG PO CPDR: 20.0000 mg | DELAYED_RELEASE_CAPSULE | Freq: Every day | ORAL | 3 refills | Status: DC

## 2018-09-07 MED ORDER — ATORVASTATIN CALCIUM 40 MG PO TABS: 40.0000 mg | ORAL_TABLET | Freq: Every day | ORAL | 2 refills | Status: DC

## 2018-09-07 NOTE — Progress Notes (Signed)
Subjective:    Patient ID: Gabriel Wu, male    DOB: Sep 01, 1940, 78 y.o.   MRN: [id number]  HPI Gabriel Wu is a 78 y.o. male Presents today for: Chief Complaint  Patient presents with  . Diabetes    was last seen here 03/09/18. 6 mon f/u  . Hypertension    bp was high at last visit here on 03/09/18 increased lisinopril to 20 mg. Bp still has been running high today bp was 197/90   Hypertension: BP Readings from Last 3 Encounters:  09/07/18 (!) 170/90  03/09/18 (!) 150/80  09/10/17 (!) 161/86   Lab Results  Component Value Date   CREATININE 1.02 04/20/2018  Elevated in October, lisinopril increased to 20 mg daily.  Repeat creatinine October 1 looked okay as above. Taking lisinopril 20mg  qd.  Home home BP 197/90. No new symptoms.  No diet changes.   Hyperlipidemia:  Lab Results  Component Value Date   CHOL 103 03/09/2018   HDL 38 (L) 03/09/2018   LDLCALC 42 03/09/2018   TRIG 117 03/09/2018   CHOLHDL 2.7 03/09/2018   Lab Results  Component Value Date   ALT 21 03/09/2018   AST 24 03/09/2018   ALKPHOS 91 03/09/2018   BILITOT 0.4 03/09/2018  On Lipitor 40 mg daily, no new myalgias.    Diabetes: Microalbumin: Microalbumin 36.5 on September 10, 2017. Optho, foot exam, pneumovax: Up-to-date. Controlled on labs today - 6.4 No new side effects with metformin.   Lab Results  Component Value Date   HGBA1C 6.4 (A) 09/07/2018   HGBA1C 6.5 (H) 03/09/2018   HGBA1C 6.4 (H) 09/10/2017   Lab Results  Component Value Date   LDLCALC 42 03/09/2018   CREATININE 1.02 04/20/2018  On statin, ACE inhibitor as above.  Heartburn: No hx of PUD known. Taking omeprazole usually daily. Return of symptoms if skips day.     Patient Active Problem List   Diagnosis Date Noted  . DM (diabetes mellitus) (HCC) 05/12/2016  . Hyperlipemia   . Chest pain 04/12/2016  . Essential hypertension 04/12/2016  . Dyspepsia 04/12/2016   Past Medical History:  Diagnosis Date    . Urolithiasis    Past stone spontaneously per patient.   No past surgical history on file. No Known Allergies Prior to Admission medications   Medication Sig Start Date End Date Taking? Authorizing Provider  aspirin 81 MG EC tablet Take 1 tablet (81 mg total) by mouth daily. 08/11/16   Doristine Bosworth, MD  atorvastatin (LIPITOR) 40 MG tablet Take 1 tablet (40 mg total) by mouth daily at 6 PM. 05/13/18   [name], MD  lisinopril (PRINIVIL,ZESTRIL) 20 MG tablet Take 1 tablet (20 mg total) by mouth daily. 03/09/18   [name], MD  metFORMIN (GLUCOPHAGE) 500 MG tablet Take 1 tablet (500 mg total) by mouth 2 (two) times daily with a meal. 03/09/18   [name], MD  omeprazole (PRILOSEC) 20 MG capsule Take 1 capsule (20 mg total) by mouth daily. 05/17/18   [name], MD   Social History   Socioeconomic History  . Marital status: Single    Spouse name: Not on file  . Number of children: Not on file  . Years of education: Not on file  . Highest education level: Not on file  Occupational History  . Not on file  Social Needs  . Financial resource strain: Not on file  . Food insecurity:    Worry: Not on file  Inability: Not on file  . Transportation needs:    Medical: Not on file    Non-medical: Not on file  Tobacco Use  . Smoking status: Former Smoker    Years: 50.00    Types: Cigars    Last attempt to quit: 07/21/2010    Years since quitting: 8.1  . Smokeless tobacco: Never Used  Substance and Sexual Activity  . Alcohol use: No  . Drug use: No  . Sexual activity: Never  Lifestyle  . Physical activity:    Days per week: Not on file    Minutes per session: Not on file  . Stress: Not on file  Relationships  . Social connections:    Talks on phone: Not on file    Gets together: Not on file    Attends religious service: Not on file    Active member of club or organization: Not on file    Attends meetings of clubs or organizations: Not on file     Relationship status: Not on file  . Intimate partner violence:    Fear of current or ex partner: Not on file    Emotionally abused: Not on file    Physically abused: Not on file    Forced sexual activity: Not on file  Other Topics Concern  . Not on file  Social History Narrative  . Not on file     Review of Systems  Constitutional: Negative for fatigue and unexpected weight change.  Eyes: Negative for visual disturbance.  Respiratory: Negative for cough, chest tightness and shortness of breath.   Cardiovascular: Negative for chest pain, palpitations and leg swelling.  Gastrointestinal: Negative for abdominal pain and blood in stool.  Neurological: Negative for dizziness, light-headedness and headaches.       Objective:   Physical Exam Vitals signs reviewed.  Constitutional:      Appearance: He is well-developed.  HENT:     Head: Normocephalic and atraumatic.  Eyes:     Pupils: Pupils are equal, round, and reactive to light.  Neck:     Vascular: No carotid bruit or JVD.  Cardiovascular:     Rate and Rhythm: Normal rate and regular rhythm.     Heart sounds: Normal heart sounds. No murmur.  Pulmonary:     Effort: Pulmonary effort is normal.     Breath sounds: Normal breath sounds. No rales.  Skin:    General: Skin is warm and dry.  Neurological:     Mental Status: He is alert and oriented to person, place, and time.    Vitals:   09/07/18 1343 09/07/18 1344  BP: (!) 197/90 (!) 170/90  Pulse: 81   Resp: 14   Temp: 98.8 F (37.1 C)   TempSrc: Oral   SpO2: 96%   Weight: 201 lb 6.4 oz (91.4 kg)   Height: 5\' 9"  (1.753 m)         Assessment & Plan:    Gabriel Wu is a 78 y.o. male Essential hypertension - Plan: Comprehensive metabolic panel, lisinopril-hydrochlorothiazide (ZESTORETIC) 20-12.5 MG tablet  -Uncontrolled, add HCTZ to current lisinopril dose.  Potential side effects discussed.  Recheck 6 weeks, sooner if persistent  elevations  Hyperlipidemia, unspecified hyperlipidemia type - Plan: Lipid Panel, atorvastatin (LIPITOR) 40 MG tablet  - Stable, tolerating current regimen. Medications refilled. Labs pending as above.   Type 2 diabetes mellitus with microalbuminuria, without long-term current use of insulin (HCC) - Plan: Microalbumin/Creatinine Ratio, Urine, POCT glycosylated hemoglobin (Hb A1C), POCT glucose (manual  entry), metFORMIN (GLUCOPHAGE) 500 MG tablet  -  Stable, tolerating current regimen. Medications refilled. Labs pending as above.   Gastroesophageal reflux disease, esophagitis presence not specified - Plan: omeprazole (PRILOSEC) 20 MG capsule  -Trigger foods discussed, handout given, omeprazole up to once per day.  Refilled  Meds ordered this encounter  Medications  . lisinopril-hydrochlorothiazide (ZESTORETIC) 20-12.5 MG tablet    Sig: Take 1 tablet by mouth daily.    Dispense:  90 tablet    Refill:  1  . atorvastatin (LIPITOR) 40 MG tablet    Sig: Take 1 tablet (40 mg total) by mouth daily at 6 PM.    Dispense:  90 tablet    Refill:  2  . metFORMIN (GLUCOPHAGE) 500 MG tablet    Sig: Take 1 tablet (500 mg total) by mouth 2 (two) times daily with a meal.    Dispense:  180 tablet    Refill:  1  . omeprazole (PRILOSEC) 20 MG capsule    Sig: Take 1 capsule (20 mg total) by mouth daily.    Dispense:  90 capsule    Refill:  3   Patient Instructions    Added HCTZ (fluid pill) to you other blood pressure medicine. Keep a record of your blood pressures outside of the office and if remaining over 140/90 in next 2 weeks return for recheck.   Diabetes test is stable.  Continue metformin and Lipitor for cholesterol.  I will check some blood work.    Recheck in 6 weeks for blood pressure.  See foods below that may worsen heartburn.  Okay to continue omeprazole up to once per day if needed.   Food Choices for Gastroesophageal Reflux Disease, Adult When you have gastroesophageal reflux  disease (GERD), the foods you eat and your eating habits are very important. Choosing the right foods can help ease your discomfort. Think about working with a nutrition specialist (dietitian) to help you make good choices. What are tips for following this plan?  Meals  Choose healthy foods that are low in fat, such as fruits, vegetables, whole grains, low-fat dairy products, and lean meat, fish, and poultry.  Eat small meals often instead of 3 large meals a day. Eat your meals slowly, and in a place where you are relaxed. Avoid bending over or lying down until 2-3 hours after eating.  Avoid eating meals 2-3 hours before bed.  Avoid drinking a lot of liquid with meals.  Cook foods using methods other than frying. Bake, grill, or broil food instead.  Avoid or limit: ? Chocolate. ? Peppermint or spearmint. ? Alcohol. ? Pepper. ? Black and decaffeinated coffee. ? Black and decaffeinated tea. ? Bubbly (carbonated) soft drinks. ? Caffeinated energy drinks and soft drinks.  Limit high-fat foods such as: ? Fatty meat or fried foods. ? Whole milk, cream, butter, or ice cream. ? Nuts and nut butters. ? Pastries, donuts, and sweets made with butter or shortening.  Avoid foods that cause symptoms. These foods may be different for everyone. Common foods that cause symptoms include: ? Tomatoes. ? Oranges, lemons, and limes. ? Peppers. ? Spicy food. ? Onions and garlic. ? Vinegar. Lifestyle  Maintain a healthy weight. Ask your doctor what weight is healthy for you. If you need to lose weight, work with your doctor to do so safely.  Exercise for at least 30 minutes for 5 or more days each week, or as told by your doctor.  Wear loose-fitting clothes.  Do not smoke. If  you need help quitting, ask your doctor.  Sleep with the head of your bed higher than your feet. Use a wedge under the mattress or blocks under the bed frame to raise the head of the bed. Summary  When you have  gastroesophageal reflux disease (GERD), food and lifestyle choices are very important in easing your symptoms.  Eat small meals often instead of 3 large meals a day. Eat your meals slowly, and in a place where you are relaxed.  Limit high-fat foods such as fatty meat or fried foods.  Avoid bending over or lying down until 2-3 hours after eating.  Avoid peppermint and spearmint, caffeine, alcohol, and chocolate. This information is not intended to replace advice given to you by your health care provider. Make sure you discuss any questions you have with your health care provider. Document Released: 01/06/2012 Document Revised: 08/12/2016 Document Reviewed: 08/12/2016 Elsevier Interactive Patient Education  [employer].   If you have lab work done today you will be contacted with your lab results within the next 2 weeks.  If you have not heard from [place] then please contact us. The fastest way to get your results is to register for My Chart.   IF you received an x-ray today, you will receive an invoice from [facility]. Please contact [facility] at [phone] with questions or concerns regarding your invoice.   IF you received labwork today, you will receive an invoice from [place]. Please contact [facility] at [phone] with questions or concerns regarding your invoice.   Our billing staff will not be able to assist you with questions regarding bills from these companies.  You will be contacted with the lab results as soon as they are available. The fastest way to get your results is to activate your My Chart account. Instructions are located on the last page of this paperwork. If you have not heard from [place] regarding the results in 2 weeks, please contact this office.       Signed,   Meredith Staggers, MD Primary Care at [facility].  09/07/18 2:33 PM

## 2018-09-07 NOTE — Patient Instructions (Addendum)
Added HCTZ (fluid pill) to you other blood pressure medicine. Keep a record of your blood pressures outside of the office and if remaining over 140/90 in next 2 weeks return for recheck.   Diabetes test is stable.  Continue metformin and Lipitor for cholesterol.  I will check some blood work.    Recheck in 6 weeks for blood pressure.  See foods below that may worsen heartburn.  Okay to continue omeprazole up to once per day if needed.   Food Choices for Gastroesophageal Reflux Disease, Adult When you have gastroesophageal reflux disease (GERD), the foods you eat and your eating habits are very important. Choosing the right foods can help ease your discomfort. Think about working with a nutrition specialist (dietitian) to help you make good choices. What are tips for following this plan?  Meals  Choose healthy foods that are low in fat, such as fruits, vegetables, whole grains, low-fat dairy products, and lean meat, fish, and poultry.  Eat small meals often instead of 3 large meals a day. Eat your meals slowly, and in a place where you are relaxed. Avoid bending over or lying down until 2-3 hours after eating.  Avoid eating meals 2-3 hours before bed.  Avoid drinking a lot of liquid with meals.  Cook foods using methods other than frying. Bake, grill, or broil food instead.  Avoid or limit: ? Chocolate. ? Peppermint or spearmint. ? Alcohol. ? Pepper. ? Black and decaffeinated coffee. ? Black and decaffeinated tea. ? Bubbly (carbonated) soft drinks. ? Caffeinated energy drinks and soft drinks.  Limit high-fat foods such as: ? Fatty meat or fried foods. ? Whole milk, cream, butter, or ice cream. ? Nuts and nut butters. ? Pastries, donuts, and sweets made with butter or shortening.  Avoid foods that cause symptoms. These foods may be different for everyone. Common foods that cause symptoms include: ? Tomatoes. ? Oranges, lemons, and limes. ? Peppers. ? Spicy food. ? Onions  and garlic. ? Vinegar. Lifestyle  Maintain a healthy weight. Ask your doctor what weight is healthy for you. If you need to lose weight, work with your doctor to do so safely.  Exercise for at least 30 minutes for 5 or more days each week, or as told by your doctor.  Wear loose-fitting clothes.  Do not smoke. If you need help quitting, ask your doctor.  Sleep with the head of your bed higher than your feet. Use a wedge under the mattress or blocks under the bed frame to raise the head of the bed. Summary  When you have gastroesophageal reflux disease (GERD), food and lifestyle choices are very important in easing your symptoms.  Eat small meals often instead of 3 large meals a day. Eat your meals slowly, and in a place where you are relaxed.  Limit high-fat foods such as fatty meat or fried foods.  Avoid bending over or lying down until 2-3 hours after eating.  Avoid peppermint and spearmint, caffeine, alcohol, and chocolate. This information is not intended to replace advice given to you by your health care provider. Make sure you discuss any questions you have with your health care provider. Document Released: 01/06/2012 Document Revised: 08/12/2016 Document Reviewed: 08/12/2016 Elsevier Interactive Patient Education  [employer].   If you have lab work done today you will be contacted with your lab results within the next 2 weeks.  If you have not heard from [place] then please contact us. The fastest way to get your results  is to register for My Chart.   IF you received an x-ray today, you will receive an invoice from [facility]. Please contact [facility] at [phone] with questions or concerns regarding your invoice.   IF you received labwork today, you will receive an invoice from [place] [facility]. Please contact [facility] at [phone] with questions or concerns regarding your invoice.   Our billing staff will not be able to assist you with questions  regarding bills from these companies.  You will be contacted with the lab results as soon as they are available. The fastest way to get your results is to activate your My Chart account. Instructions are located on the last page of this paperwork. If you have not heard from [place] regarding the results in 2 weeks, please contact this office.

## 2018-09-08 LAB — LIPID PANEL
Chol/HDL Ratio: 2.2 ratio (ref 0.0–5.0)
Cholesterol, Total: 88 mg/dL — ABNORMAL LOW (ref 100–199)
HDL: 40 mg/dL (ref >39)
LDL Calculated: 27 mg/dL (ref 0–99)
Triglycerides: 103 mg/dL (ref 0–149)
VLDL Cholesterol Cal: 21 mg/dL (ref 5–40)

## 2018-09-08 LAB — MICROALBUMIN / CREATININE URINE RATIO
CREATININE, UR: 278.3 mg/dL
MICROALBUM., U, RANDOM: 39.9 ug/mL
Microalb/Creat Ratio: 14 mg/g creat (ref 0–29)

## 2018-09-08 LAB — COMPREHENSIVE METABOLIC PANEL
ALBUMIN: 4.5 g/dL (ref 3.7–4.7)
ALT: 14 IU/L (ref 0–44)
AST: 21 IU/L (ref 0–40)
Albumin/Globulin Ratio: 1.6 (ref 1.2–2.2)
Alkaline Phosphatase: 78 IU/L (ref 39–117)
BUN/Creatinine Ratio: 13 (ref 10–24)
BUN: 13 mg/dL (ref 8–27)
Bilirubin Total: 0.4 mg/dL (ref 0.0–1.2)
CALCIUM: 9.7 mg/dL (ref 8.6–10.2)
CHLORIDE: 100 mmol/L (ref 96–106)
CO2: 22 mmol/L (ref 20–29)
Creatinine, Ser: 0.97 mg/dL (ref 0.76–1.27)
GFR calc non Af Amer: 75 mL/min/{1.73_m2} (ref >59)
GFR, EST AFRICAN AMERICAN: 87 mL/min/{1.73_m2} (ref >59)
GLOBULIN, TOTAL: 2.8 g/dL (ref 1.5–4.5)
Glucose: 123 mg/dL — ABNORMAL HIGH (ref 65–99)
POTASSIUM: 3.9 mmol/L (ref 3.5–5.2)
Sodium: 137 mmol/L (ref 134–144)
Total Protein: 7.3 g/dL (ref 6.0–8.5)

## 2018-09-09 ENCOUNTER — Ambulatory Visit: Payer: Medicare Other | Admitting: Family Medicine

## 2018-09-21 ENCOUNTER — Encounter: Payer: Self-pay | Admitting: Radiology

## 2018-10-20 ENCOUNTER — Encounter: Payer: Self-pay | Admitting: Family Medicine

## 2018-10-20 ENCOUNTER — Other Ambulatory Visit: Payer: Self-pay

## 2018-10-20 ENCOUNTER — Ambulatory Visit (INDEPENDENT_AMBULATORY_CARE_PROVIDER_SITE_OTHER): Payer: Medicare Other | Admitting: Family Medicine

## 2018-10-20 VITALS — BP 120/60 | HR 81 | Temp 99.3°F | Resp 18 | Ht 69.13 in | Wt 196.0 lb

## 2018-10-20 DIAGNOSIS — I10 Essential (primary) hypertension: Secondary | ICD-10-CM

## 2018-10-20 NOTE — Progress Notes (Signed)
Subjective:    Patient ID: Gabriel Wu, male    DOB: 09-22-1940, 78 y.o.   MRN: [id number]  HPI Gabriel Wu is a 78 y.o. male Presents today for: Chief Complaint  Patient presents with  . Hypertension    f/u   Hypertension: BP Readings from Last 3 Encounters:  10/20/18 (!) 156/78  09/07/18 (!) 170/90  03/09/18 (!) 150/80   Lab Results  Component Value Date   CREATININE 0.97 09/07/2018   Uncontrolled at last visit in February.  HCTZ was added to current regimen.  Now on lisinopril 20 mg daily, HCTZ 12.5 mg daily as combination pill. Slight increased urination with this addition.  Home readings - 104-145/73-96. Wrist machine. 130/75 this morning. Slight dizziness when 110's reading - notes only with standing up quickly.No syncope.   Patient Active Problem List   Diagnosis Date Noted  . DM (diabetes mellitus) (HCC) 05/12/2016  . Hyperlipemia   . Chest pain 04/12/2016  . Essential hypertension 04/12/2016  . Dyspepsia 04/12/2016   Past Medical History:  Diagnosis Date  . Hypertension   . Urolithiasis    Past stone spontaneously per patient.   History reviewed. No pertinent surgical history. No Known Allergies Prior to Admission medications   Medication Sig Start Date End Date Taking? Authorizing Provider  aspirin 81 MG EC tablet Take 1 tablet (81 mg total) by mouth daily. 08/11/16  Yes Doristine Bosworth, MD  atorvastatin (LIPITOR) 40 MG tablet Take 1 tablet (40 mg total) by mouth daily at 6 PM. 09/07/18  Yes [name], MD  lisinopril-hydrochlorothiazide (ZESTORETIC) 20-12.5 MG tablet Take 1 tablet by mouth daily. 09/07/18  Yes [name], MD  metFORMIN (GLUCOPHAGE) 500 MG tablet Take 1 tablet (500 mg total) by mouth 2 (two) times daily with a meal. 09/07/18  Yes [name], MD  omeprazole (PRILOSEC) 20 MG capsule Take 1 capsule (20 mg total) by mouth daily. 09/07/18  Yes [name], MD   Social History   Socioeconomic History  .  Marital status: Single    Spouse name: Not on file  . Number of children: Not on file  . Years of education: Not on file  . Highest education level: Not on file  Occupational History  . Not on file  Social Needs  . Financial resource strain: Not on file  . Food insecurity:    Worry: Not on file    Inability: Not on file  . Transportation needs:    Medical: Not on file    Non-medical: Not on file  Tobacco Use  . Smoking status: Former Smoker    Years: 50.00    Types: Cigars    Last attempt to quit: 07/21/2010    Years since quitting: 8.2  . Smokeless tobacco: Never Used  Substance and Sexual Activity  . Alcohol use: No  . Drug use: No  . Sexual activity: Never  Lifestyle  . Physical activity:    Days per week: Not on file    Minutes per session: Not on file  . Stress: Not on file  Relationships  . Social connections:    Talks on phone: Not on file    Gets together: Not on file    Attends religious service: Not on file    Active member of club or organization: Not on file    Attends meetings of clubs or organizations: Not on file    Relationship status: Not on file  . Intimate partner violence:  Fear of current or ex partner: Not on file    Emotionally abused: Not on file    Physically abused: Not on file    Forced sexual activity: Not on file  Other Topics Concern  . Not on file  Social History Narrative  . Not on file    Review of Systems  Constitutional: Negative for fatigue and unexpected weight change.  Eyes: Negative for visual disturbance.  Respiratory: Negative for cough, chest tightness and shortness of breath.   Cardiovascular: Negative for chest pain, palpitations and leg swelling.  Gastrointestinal: Negative for abdominal pain and blood in stool.  Neurological: Negative for dizziness, light-headedness and headaches.       Objective:   Physical Exam Vitals signs reviewed.  Constitutional:      Appearance: He is well-developed.  HENT:      Head: Normocephalic and atraumatic.  Eyes:     Pupils: Pupils are equal, round, and reactive to light.  Neck:     Vascular: No carotid bruit or JVD.  Cardiovascular:     Rate and Rhythm: Normal rate and regular rhythm.     Heart sounds: Normal heart sounds. No murmur.  Pulmonary:     Effort: Pulmonary effort is normal.     Breath sounds: Normal breath sounds. No rales.  Skin:    General: Skin is warm and dry.  Neurological:     Mental Status: He is alert and oriented to person, place, and time.    Vitals:   10/20/18 1357 10/20/18 1408 10/20/18 1459  BP: (!) 165/81 (!) 156/78 120/60  Pulse: 81    Resp: 18    Temp: 99.3 F (37.4 C)    TempSrc: Oral    SpO2: 97%    Weight: 196 lb (88.9 kg)    Height: 5' 9.13" (1.756 m)        Assessment & Plan:   Gabriel Wu is a 78 y.o. male Essential hypertension  -Some variability in time readings, but eventual blood pressure check in office was okay.  We will continue same regimen.  Risks of dizziness with increasing doses.  Monitor home readings, and if persistent elevated readings or low readings with associated orthostasis/dizziness, may need med adjustment.  Anticipatory guidance given during current COVID-19 pandemic.  No orders of the defined types were placed in this encounter.  Patient Instructions   Keep a record of your blood pressures outside of the office and if remaining over 140/90 - let me know as we may need to change meds. Also if running low - in 100's or 110's and dizziness - we may also need to change meds. Make sure you stay hydrated - small sips frequently  during the day. Return to the clinic or go to the nearest emergency room if any of your symptoms worsen or new symptoms occur.  Stay at home as much as possible during the pandemic. If you do become ill, let me know. Let me know if there are other questions.   How to Take Your Blood Pressure You can take your blood pressure at home with a machine. You may  need to check your blood pressure at home:  To check if you have high blood pressure (hypertension).  To check your blood pressure over time.  To make sure your blood pressure medicine is working. Supplies needed: You will need a blood pressure machine, or monitor. You can buy one at a drugstore or online. When choosing one:  Choose one with an arm  cuff.  Choose one that wraps around your upper arm. Only one finger should fit between your arm and the cuff.  Do not choose one that measures your blood pressure from your wrist or finger. Your doctor can suggest a monitor. How to prepare Avoid these things for 30 minutes before checking your blood pressure:  Drinking caffeine.  Drinking alcohol.  Eating.  Smoking.  Exercising. Five minutes before checking your blood pressure:  Pee.  Sit in a dining chair. Avoid sitting in a soft couch or armchair.  Be quiet. Do not talk. How to take your blood pressure Follow the instructions that came with your machine. If you have a digital blood pressure monitor, these may be the instructions: 1. Sit up straight. 2. Place your feet on the floor. Do not cross your ankles or legs. 3. Rest your left arm at the level of your heart. You may rest it on a table, desk, or chair. 4. Pull up your shirt sleeve. 5. Wrap the blood pressure cuff around the upper part of your left arm. The cuff should be 1 inch (2.5 cm) above your elbow. It is best to wrap the cuff around bare skin. 6. Fit the cuff snugly around your arm. You should be able to place only one finger between the cuff and your arm. 7. Put the cord inside the groove of your elbow. 8. Press the power button. 9. Sit quietly while the cuff fills with air and loses air. 10. Write down the numbers on the screen. 11. Wait 2-3 minutes and then repeat steps 1-10. What do the numbers mean? Two numbers make up your blood pressure. The first number is called systolic pressure. The second is called  diastolic pressure. An example of a blood pressure reading is "120 over 80" (or 120/80). If you are an adult and do not have a medical condition, use this guide to find out if your blood pressure is normal: Normal  First number: below 120.  Second number: below 80. Elevated  First number: 120-129.  Second number: below 80. Hypertension stage 1  First number: 130-139.  Second number: 80-89. Hypertension stage 2  First number: 140 or above.  Second number: 90 or above. Your blood pressure is above normal even if only the top or bottom number is above normal. Follow these instructions at home:  Check your blood pressure as often as your doctor tells you to.  Take your monitor to your next doctor's appointment. Your doctor will: ? Make sure you are using it correctly. ? Make sure it is working right.  Make sure you understand what your blood pressure numbers should be.  Tell your doctor if your medicines are causing side effects. Contact a doctor if:  Your blood pressure keeps being high. Get help right away if:  Your first blood pressure number is higher than 180.  Your second blood pressure number is higher than 120. This information is not intended to replace advice given to you by your health care provider. Make sure you discuss any questions you have with your health care provider. Document Released: 06/19/2008 Document Revised: 06/04/2016 Document Reviewed: 12/14/2015 Elsevier Interactive Patient Education  [employer].    If you have lab work done today you will be contacted with your lab results within the next 2 weeks.  If you have not heard from [place] then please contact us. The fastest way to get your results is to register for My Chart.   IF you  received an x-ray today, you will receive an invoice from [facility]. Please contact [facility] at [phone] with questions or concerns regarding your invoice.   IF you received labwork  today, you will receive an invoice from [facility]. Please contact [facility] at [phone] with questions or concerns regarding your invoice.   Our billing staff will not be able to assist you with questions regarding bills from these companies.  You will be contacted with the lab results as soon as they are available. The fastest way to get your results is to activate your My Chart account. Instructions are located on the last page of this paperwork. If you have not heard from us regarding the results in 2 weeks, please contact this office.      Signed,   Meredith StaggersJeffrey Lucinda Spells, MD [facility].  10/21/18 11:34 AM

## 2018-10-20 NOTE — Patient Instructions (Addendum)
Keep a record of your blood pressures outside of the office and if remaining over 140/90 - let me know as we may need to change meds. Also if running low - in 100's or 110's and dizziness - we may also need to change meds. Make sure you stay hydrated - small sips frequently  during the day. Return to the clinic or go to the nearest emergency room if any of your symptoms worsen or new symptoms occur.  Stay at home as much as possible during the pandemic. If you do become ill, let me know. Let me know if there are other questions.   How to Take Your Blood Pressure You can take your blood pressure at home with a machine. You may need to check your blood pressure at home:  To check if you have high blood pressure (hypertension).  To check your blood pressure over time.  To make sure your blood pressure medicine is working. Supplies needed: You will need a blood pressure machine, or monitor. You can buy one at a drugstore or online. When choosing one:  Choose one with an arm cuff.  Choose one that wraps around your upper arm. Only one finger should fit between your arm and the cuff.  Do not choose one that measures your blood pressure from your wrist or finger. Your doctor can suggest a monitor. How to prepare Avoid these things for 30 minutes before checking your blood pressure:  Drinking caffeine.  Drinking alcohol.  Eating.  Smoking.  Exercising. Five minutes before checking your blood pressure:  Pee.  Sit in a dining chair. Avoid sitting in a soft couch or armchair.  Be quiet. Do not talk. How to take your blood pressure Follow the instructions that came with your machine. If you have a digital blood pressure monitor, these may be the instructions: 1. Sit up straight. 2. Place your feet on the floor. Do not cross your ankles or legs. 3. Rest your left arm at the level of your heart. You may rest it on a table, desk, or chair. 4. Pull up your shirt sleeve. 5. Wrap the  blood pressure cuff around the upper part of your left arm. The cuff should be 1 inch (2.5 cm) above your elbow. It is best to wrap the cuff around bare skin. 6. Fit the cuff snugly around your arm. You should be able to place only one finger between the cuff and your arm. 7. Put the cord inside the groove of your elbow. 8. Press the power button. 9. Sit quietly while the cuff fills with air and loses air. 10. Write down the numbers on the screen. 11. Wait 2-3 minutes and then repeat steps 1-10. What do the numbers mean? Two numbers make up your blood pressure. The first number is called systolic pressure. The second is called diastolic pressure. An example of a blood pressure reading is "120 over 80" (or 120/80). If you are an adult and do not have a medical condition, use this guide to find out if your blood pressure is normal: Normal  First number: below 120.  Second number: below 80. Elevated  First number: 120-129.  Second number: below 80. Hypertension stage 1  First number: 130-139.  Second number: 80-89. Hypertension stage 2  First number: 140 or above.  Second number: 90 or above. Your blood pressure is above normal even if only the top or bottom number is above normal. Follow these instructions at home:  Check your blood  pressure as often as your doctor tells you to.  Take your monitor to your next doctor's appointment. Your doctor will: ? Make sure you are using it correctly. ? Make sure it is working right.  Make sure you understand what your blood pressure numbers should be.  Tell your doctor if your medicines are causing side effects. Contact a doctor if:  Your blood pressure keeps being high. Get help right away if:  Your first blood pressure number is higher than 180.  Your second blood pressure number is higher than 120. This information is not intended to replace advice given to you by your health care provider. Make sure you discuss any questions  you have with your health care provider. Document Released: 06/19/2008 Document Revised: 06/04/2016 Document Reviewed: 12/14/2015 Elsevier Interactive Patient Education  [employer].    If you have lab work done today you will be contacted with your lab results within the next 2 weeks.  If you have not heard from [place] then please contact us. The fastest way to get your results is to register for My Chart.   IF you received an x-ray today, you will receive an invoice from [facility]. Please contact [facility] at [phone] with questions or concerns regarding your invoice.   IF you received labwork today, you will receive an invoice from [facility]. Please contact [facility] at [phone] with questions or concerns regarding your invoice.   Our billing staff will not be able to assist you with questions regarding bills from these companies.  You will be contacted with the lab results as soon as they are available. The fastest way to get your results is to activate your My Chart account. Instructions are located on the last page of this paperwork. If you have not heard from [place] regarding the results in 2 weeks, please contact this office.

## 2019-01-19 ENCOUNTER — Ambulatory Visit: Payer: Medicare Other | Admitting: Family Medicine

## 2019-01-20 ENCOUNTER — Telehealth (INDEPENDENT_AMBULATORY_CARE_PROVIDER_SITE_OTHER): Payer: Medicare Other | Admitting: Family Medicine

## 2019-01-20 ENCOUNTER — Other Ambulatory Visit: Payer: Self-pay

## 2019-01-20 ENCOUNTER — Encounter: Payer: Self-pay | Admitting: Family Medicine

## 2019-01-20 VITALS — BP 124/84 | Ht 69.0 in | Wt 200.0 lb

## 2019-01-20 DIAGNOSIS — R809 Proteinuria, unspecified: Secondary | ICD-10-CM

## 2019-01-20 DIAGNOSIS — R42 Dizziness and giddiness: Secondary | ICD-10-CM

## 2019-01-20 DIAGNOSIS — E119 Type 2 diabetes mellitus without complications: Secondary | ICD-10-CM

## 2019-01-20 DIAGNOSIS — K219 Gastro-esophageal reflux disease without esophagitis: Secondary | ICD-10-CM

## 2019-01-20 DIAGNOSIS — I10 Essential (primary) hypertension: Secondary | ICD-10-CM | POA: Diagnosis not present

## 2019-01-20 DIAGNOSIS — E785 Hyperlipidemia, unspecified: Secondary | ICD-10-CM | POA: Diagnosis not present

## 2019-01-20 DIAGNOSIS — E1129 Type 2 diabetes mellitus with other diabetic kidney complication: Secondary | ICD-10-CM

## 2019-01-20 MED ORDER — ATORVASTATIN CALCIUM 40 MG PO TABS: 40.0000 mg | ORAL_TABLET | Freq: Every day | ORAL | 1 refills | Status: DC

## 2019-01-20 MED ORDER — METFORMIN HCL 500 MG PO TABS: 500.0000 mg | ORAL_TABLET | Freq: Two times a day (BID) | ORAL | 1 refills | Status: DC

## 2019-01-20 MED ORDER — LISINOPRIL-HYDROCHLOROTHIAZIDE 20-12.5 MG PO TABS: 1.0000 | ORAL_TABLET | Freq: Every day | ORAL | 1 refills | Status: DC

## 2019-01-20 NOTE — Progress Notes (Signed)
Virtual Visit via Telephone Note  I connected with Gabriel Blazingouglas Wu on 01/20/19 at 11:51 AM by telephone and verified that I am speaking with the correct person using two identifiers.   I discussed the limitations, risks, security and privacy concerns of performing an evaluation and management service by telephone and the availability of in person appointments. I also discussed with the patient that there may be a patient responsible charge related to this service. The patient expressed understanding and agreed to proceed, consent obtained  Chief complaint: HTN follow up, DM2.  History of Present Illness: Gabriel Wu is a 78 y.o. male  Hypertension: BP Readings from Last 3 Encounters:  10/20/18 120/60  09/07/18 (!) 170/90  03/09/18 (!) 150/80   Lab Results  Component Value Date   CREATININE 0.97 09/07/2018  Added HCTZ 12.5 mg to lisinopril 20 mg in February due to elevated readings.  Variable home readings at April visit.  Noted rare dizziness with lower 100 readings.  Home machine read 130/75, in office ranged from 120/60-165/81.  With some reported borderline lows at home decided to remain on same regimen. Home reading: 116/69, 118/69, 124/84.  No new side effects with lisinopril/hct. No problems with nocturia.  Constitutional: Negative for fatigue and unexpected weight change.  Eyes: Negative for visual disturbance.  Respiratory: Negative for cough, chest tightness and shortness of breath.   Cardiovascular: Negative for chest pain, palpitations and leg swelling.  Gastrointestinal: Negative for abdominal pain and blood in stool.  Neurological: only with bending over and standing up quickly. and standing up quickly light-headedness and headaches.  Drinking fluids during the day - UOP - a little lighter than ice tea.    Diabetes: Controlled with 500 mg metformin twice daily with stable A1c in February. He is on ACE inhibitor as well as statin and aspirin 81 mg daily.  Microalbumin: Normal microalbumin/creatinine ratio February 18 Home readings: not checking.  No new side effects with metformin, no recent diarrhea.  Optho, foot exam, pneumovax: Up-to-date  Lab Results  Component Value Date   HGBA1C 6.4 (A) 09/07/2018   HGBA1C 6.5 (H) 03/09/2018   HGBA1C 6.4 (H) 09/10/2017   Lab Results  Component Value Date   LDLCALC 27 09/07/2018   CREATININE 0.97 09/07/2018      Patient Active Problem List   Diagnosis Date Noted  . DM (diabetes mellitus) (HCC) 05/12/2016  . Hyperlipemia   . Chest pain 04/12/2016  . Essential hypertension 04/12/2016  . Dyspepsia 04/12/2016   Past Medical History:  Diagnosis Date  . Hypertension   . Urolithiasis    Past stone spontaneously per patient.   No past surgical history on file. No Known Allergies Prior to Admission medications   Medication Sig Start Date End Date Taking? Authorizing Provider  aspirin 81 MG EC tablet Take 1 tablet (81 mg total) by mouth daily. 08/11/16   Doristine BosworthStallings, Zoe A, MD  atorvastatin (LIPITOR) 40 MG tablet Take 1 tablet (40 mg total) by mouth daily at 6 PM. 09/07/18   Shade FloodGreene, Dalessandro Baldyga R, MD  lisinopril-hydrochlorothiazide (ZESTORETIC) 20-12.5 MG tablet Take 1 tablet by mouth daily. 09/07/18   Shade FloodGreene, Leyan Branden R, MD  metFORMIN (GLUCOPHAGE) 500 MG tablet Take 1 tablet (500 mg total) by mouth 2 (two) times daily with a meal. 09/07/18   Shade FloodGreene, Sheyla Zaffino R, MD  omeprazole (PRILOSEC) 20 MG capsule Take 1 capsule (20 mg total) by mouth daily. 09/07/18   Shade FloodGreene, Dujuan Stankowski R, MD   Social History   Socioeconomic History  .  Marital status: Single    Spouse name: Not on file  . Number of children: Not on file  . Years of education: Not on file  . Highest education level: Not on file  Occupational History  . Not on file  Social Needs  . Financial resource strain: Not on file  . Food insecurity    Worry: Not on file    Inability: Not on file  . Transportation needs    Medical: Not on file     Non-medical: Not on file  Tobacco Use  . Smoking status: Former Smoker    Years: 50.00    Types: Cigars    Quit date: 07/21/2010    Years since quitting: 8.5  . Smokeless tobacco: Never Used  Substance and Sexual Activity  . Alcohol use: No  . Drug use: No  . Sexual activity: Never  Lifestyle  . Physical activity    Days per week: Not on file    Minutes per session: Not on file  . Stress: Not on file  Relationships  . Social Musicianconnections    Talks on phone: Not on file    Gets together: Not on file    Attends religious service: Not on file    Active member of club or organization: Not on file    Attends meetings of clubs or organizations: Not on file    Relationship status: Not on file  . Intimate partner violence    Fear of current or ex partner: Not on file    Emotionally abused: Not on file    Physically abused: Not on file    Forced sexual activity: Not on file  Other Topics Concern  . Not on file  Social History Narrative  . Not on file     Observations/Objective: No distress.   Assessment and Plan: Essential hypertension - Plan: Comprehensive metabolic panel,   -Stable control based on home readings, denies new side effects of meds.  Rare episode of dizziness with standing up quickly after leaning forward.  Recommended he increase water intake, and if symptoms persist, follow-up in office for other testing.  RTC/ER precautions given.  Plan for lab only visit for repeat electrolytes.  Recheck in 6 months as long as home readings stable   Type 2 diabetes mellitus without complication, without long-term current use of insulin (HCC) - Plan: Comprehensive metabolic panel, Hemoglobin A1c,  -Tolerating metformin, check A1c, no changes in regimen for now.  Episode of dizziness - Plan:   -As above, positional only intermittently.  Try to increase hydration, RTC precautions, ER precautions.   Follow Up Instructions: 6 months.   Patient Instructions  Increase water  intake during the day. If any continued dizziness, follow up in the office and we can evaluate your meds further. For now, continue same meds.  Lab visit will be scheduled in next week or two.   No changes in diabetes meds for now.   Recheck in next 6 months for blood pressure and diabetes.   Good talking to you today.  Stay safe.  Return to the clinic or go to the nearest emergency room if any of your symptoms worsen or new symptoms occur.       I discussed the assessment and treatment plan with the patient. The patient was provided an opportunity to ask questions and all were answered. The patient agreed with the plan and demonstrated an understanding of the instructions.   The patient was advised to call back or seek  an in-person evaluation if the symptoms worsen or if the condition fails to improve as anticipated.  I provided 16 minutes of non-face-to-face time during this encounter.  Signed,   Merri Ray, MD [facility].  01/20/19

## 2019-01-20 NOTE — Addendum Note (Signed)
Addended by: Merri Ray R on: 01/20/2019 12:42 PM   Modules accepted: Orders

## 2019-01-20 NOTE — Patient Instructions (Addendum)
Increase water intake during the day. If any continued dizziness, follow up in the office and we can evaluate your meds further. For now, continue same meds.  Lab visit will be scheduled in next week or two.   No changes in diabetes meds for now.   Recheck in next 6 months for blood pressure and diabetes.   Good talking to you today.  Stay safe.  Return to the clinic or go to the nearest emergency room if any of your symptoms worsen or new symptoms occur.

## 2019-01-28 DIAGNOSIS — E119 Type 2 diabetes mellitus without complications: Secondary | ICD-10-CM | POA: Diagnosis not present

## 2019-01-28 DIAGNOSIS — H40022 Open angle with borderline findings, high risk, left eye: Secondary | ICD-10-CM | POA: Diagnosis not present

## 2019-01-28 DIAGNOSIS — H2513 Age-related nuclear cataract, bilateral: Secondary | ICD-10-CM | POA: Diagnosis not present

## 2019-01-28 DIAGNOSIS — H401112 Primary open-angle glaucoma, right eye, moderate stage: Secondary | ICD-10-CM | POA: Diagnosis not present

## 2019-01-28 LAB — HM DIABETES EYE EXAM

## 2019-02-07 ENCOUNTER — Encounter: Payer: Self-pay | Admitting: Family Medicine

## 2019-02-24 ENCOUNTER — Other Ambulatory Visit: Payer: Self-pay | Admitting: Family Medicine

## 2019-02-24 DIAGNOSIS — I10 Essential (primary) hypertension: Secondary | ICD-10-CM

## 2019-02-24 DIAGNOSIS — E785 Hyperlipidemia, unspecified: Secondary | ICD-10-CM

## 2019-02-24 NOTE — Telephone Encounter (Signed)
Medication Refill - Medication:  lisinopril-hydrochlorothiazide (ZESTORETIC) 20-12.5 MG tablet atorvastatin (LIPITOR) 40 MG tablet   Has the patient contacted their pharmacy?yes advised to call office.  Preferred Pharmacy (with phone number or street name):  [place] ([place]), [place] - [address] [phone] (Phone) [phone] (Fax)   Agent: Please be advised that RX refills may take up to 3 business days. We ask that you follow-up with your pharmacy.

## 2019-05-28 DIAGNOSIS — Z23 Encounter for immunization: Secondary | ICD-10-CM | POA: Diagnosis not present

## 2019-05-31 ENCOUNTER — Ambulatory Visit (INDEPENDENT_AMBULATORY_CARE_PROVIDER_SITE_OTHER): Payer: Medicare Other | Admitting: Family Medicine

## 2019-05-31 VITALS — BP 120/60 | Ht 70.0 in | Wt 200.0 lb

## 2019-05-31 DIAGNOSIS — Z0000 Encounter for general adult medical examination without abnormal findings: Secondary | ICD-10-CM | POA: Diagnosis not present

## 2019-05-31 NOTE — Patient Instructions (Addendum)
Thank you for taking time to come for your Medicare Wellness Visit. I appreciate your ongoing commitment to your health goals. Please review the following plan we discussed and let me know if I can assist you in the future.  Leroy Kennedy LPN   Preventive Care 78 Years and Older, Male Preventive care refers to lifestyle choices and visits with your health care provider that can promote health and wellness. This includes:  A yearly physical exam. This is also called an annual well check.  Regular dental and eye exams.  Immunizations.  Screening for certain conditions.  Healthy lifestyle choices, such as diet and exercise. What can I expect for my preventive care visit? Physical exam Your health care provider will check:  Height and weight. These may be used to calculate body mass index (BMI), which is a measurement that tells if you are at a healthy weight.  Heart rate and blood pressure.  Your skin for abnormal spots. Counseling Your health care provider may ask you questions about:  Alcohol, tobacco, and drug use.  Emotional well-being.  Home and relationship well-being.  Sexual activity.  Eating habits.  History of falls.  Memory and ability to understand (cognition).  Work and work Statistician. What immunizations do I need?  Influenza (flu) vaccine  This is recommended every year. Tetanus, diphtheria, and pertussis (Tdap) vaccine  You may need a Td booster every 10 years. Varicella (chickenpox) vaccine  You may need this vaccine if you have not already been vaccinated. Zoster (shingles) vaccine  You may need this after age 63. Pneumococcal conjugate (PCV13) vaccine  One dose is recommended after age 10. Pneumococcal polysaccharide (PPSV23) vaccine  One dose is recommended after age 22. Measles, mumps, and rubella (MMR) vaccine  You may need at least one dose of MMR if you were born in 1957 or later. You may also need a second dose. Meningococcal  conjugate (MenACWY) vaccine  You may need this if you have certain conditions. Hepatitis A vaccine  You may need this if you have certain conditions or if you travel or work in places where you may be exposed to hepatitis A. Hepatitis B vaccine  You may need this if you have certain conditions or if you travel or work in places where you may be exposed to hepatitis B. Haemophilus influenzae type b (Hib) vaccine  You may need this if you have certain conditions. You may receive vaccines as individual doses or as more than one vaccine together in one shot (combination vaccines). Talk with your health care provider about the risks and benefits of combination vaccines. What tests do I need? Blood tests  Lipid and cholesterol levels. These may be checked every 5 years, or more frequently depending on your overall health.  Hepatitis C test.  Hepatitis B test. Screening  Lung cancer screening. You may have this screening every year starting at age 61 if you have a 30-pack-year history of smoking and currently smoke or have quit within the past 15 years.  Colorectal cancer screening. All adults should have this screening starting at age 23 and continuing until age 47. Your health care provider may recommend screening at age 1 if you are at increased risk. You will have tests every 1-10 years, depending on your results and the type of screening test.  Prostate cancer screening. Recommendations will vary depending on your family history and other risks.  Diabetes screening. This is done by checking your blood sugar (glucose) after you have not eaten  for a while (fasting). You may have this done every 1-3 years.  Abdominal aortic aneurysm (AAA) screening. You may need this if you are a current or former smoker.  Sexually transmitted disease (STD) testing. Follow these instructions at home: Eating and drinking  Eat a diet that includes fresh fruits and vegetables, whole grains, lean  protein, and low-fat dairy products. Limit your intake of foods with high amounts of sugar, saturated fats, and salt.  Take vitamin and mineral supplements as recommended by your health care provider.  Do not drink alcohol if your health care provider tells you not to drink.  If you drink alcohol: ? Limit how much you have to 0-2 drinks a day. ? Be aware of how much alcohol is in your drink. In the [place] one drink equals one 12 oz bottle of beer (355 mL), one 5 oz glass of wine (148 mL), or one 1 oz glass of hard liquor (44 mL). Lifestyle  Take daily care of your teeth and gums.  Stay active. Exercise for at least 30 minutes on 5 or more days each week.  Do not use any products that contain nicotine or tobacco, such as cigarettes, e-cigarettes, and chewing tobacco. If you need help quitting, ask your health care provider.  If you are sexually active, practice safe sex. Use a condom or other form of protection to prevent STIs (sexually transmitted infections).  Talk with your health care provider about taking a low-dose aspirin or statin. What's next?  Visit your health care provider once a year for a well check visit.  Ask your health care provider how often you should have your eyes and teeth checked.  Stay up to date on all vaccines. This information is not intended to replace advice given to you by your health care provider. Make sure you discuss any questions you have with your health care provider. Document Released: 08/03/2015 Document Revised: 07/01/2018 Document Reviewed: 07/01/2018 Elsevier Patient Education  2020 [employer].

## 2019-05-31 NOTE — Progress Notes (Signed)
Presents today for [employer] Visit   Date of last exam: 01/20/2019  Interpreter used for this visit?  No  I connected with  Gabriel Wu on 05/31/19 by a telephone application and verified that I am speaking with the correct person using two identifiers.   I discussed the limitations of evaluation and management by telemedicine. The patient expressed understanding and agreed to proceed.   Patient Care Team: [name], MD as PCP - General (Family Medicine)   Other items to address today:   Discussed immunizations Discussed Eye/Dental    Other Screening: Last screening for diabetes:03/09/2018 Last lipid screening: 09/07/2018  ADVANCE DIRECTIVES: Discussed: yes On File: no Materials Provided: yes  Immunization status:  Immunization History  Administered Date(s) Administered  . Influenza Split 05/07/2007  . Influenza,inj,Quad PF,6+ Mos 04/13/2016  . Pneumococcal Conjugate-13 09/10/2017  . Pneumococcal Polysaccharide-23 04/13/2016     Health Maintenance Due  Topic Date Due  . TETANUS/TDAP  06/13/1960  . HEMOGLOBIN A1C  03/08/2019     Functional Status Survey: Is the patient deaf or have difficulty hearing?: No Does the patient have difficulty seeing, even when wearing glasses/contacts?: No Does the patient have difficulty concentrating, remembering, or making decisions?: No Does the patient have difficulty walking or climbing stairs?: No Does the patient have difficulty dressing or bathing?: No Does the patient have difficulty doing errands alone such as visiting a doctor's office or shopping?: No   6CIT Screen 05/31/2019  Count back from 20 2 points        Clinical Support from 05/31/2019 in [facility]  AUDIT-C Score  0       Home Environment:   Lives in one story home No trouble climbing stairs No scattered rugs Yes grab bars Adequate lighting/no clutter   Patient Active Problem List   Diagnosis  Date Noted  . DM (diabetes mellitus) (HCC) 05/12/2016  . Hyperlipemia   . Chest pain 04/12/2016  . Essential hypertension 04/12/2016  . Dyspepsia 04/12/2016     Past Medical History:  Diagnosis Date  . Hypertension   . Urolithiasis    Past stone spontaneously per patient.     History reviewed. No pertinent surgical history.   Family History  Problem Relation Age of Onset  . Leukemia Mother   . Heart attack Father   . Stomach cancer Sister      Social History   Socioeconomic History  . Marital status: Single    Spouse name: Not on file  . Number of children: Not on file  . Years of education: Not on file  . Highest education level: Not on file  Occupational History  . Not on file  Social Needs  . Financial resource strain: Not on file  . Food insecurity    Worry: Not on file    Inability: Not on file  . Transportation needs    Medical: Not on file    Non-medical: Not on file  Tobacco Use  . Smoking status: Former Smoker    Years: 50.00    Types: Cigars    Quit date: 07/21/2010    Years since quitting: 8.8  . Smokeless tobacco: Never Used  Substance and Sexual Activity  . Alcohol use: No  . Drug use: No  . Sexual activity: Never  Lifestyle  . Physical activity    Days per week: Not on file    Minutes per session: Not on file  . Stress: Not on file  Relationships  . Social Herbalist on phone: Not on file    Gets together: Not on file    Attends religious service: Not on file    Active member of club or organization: Not on file    Attends meetings of clubs or organizations: Not on file    Relationship status: Not on file  . Intimate partner violence    Fear of current or ex partner: Not on file    Emotionally abused: Not on file    Physically abused: Not on file    Forced sexual activity: Not on file  Other Topics Concern  . Not on file  Social History Narrative  . Not on file     No Known Allergies   Prior to Admission  medications   Medication Sig Start Date End Date Taking? Authorizing Provider  aspirin 81 MG EC tablet Take 1 tablet (81 mg total) by mouth daily. 08/11/16  Yes Forrest Moron, MD  atorvastatin (LIPITOR) 40 MG tablet Take 1 tablet (40 mg total) by mouth daily at 6 PM. 01/20/19  Yes Wendie Agreste, MD  lisinopril-hydrochlorothiazide (ZESTORETIC) 20-12.5 MG tablet Take 1 tablet by mouth daily. 01/20/19  Yes Wendie Agreste, MD  metFORMIN (GLUCOPHAGE) 500 MG tablet Take 1 tablet (500 mg total) by mouth 2 (two) times daily with a meal. 01/20/19  Yes Wendie Agreste, MD  omeprazole (PRILOSEC) 20 MG capsule Take 1 capsule (20 mg total) by mouth daily. 09/07/18  Yes Wendie Agreste, MD     Depression screen [facility] 2/9 05/31/2019 01/20/2019 10/20/2018 03/09/2018 09/10/2017  Decreased Interest 0 0 0 0 0  Down, Depressed, Hopeless 0 0 0 0 0  PHQ - 2 Score 0 0 0 0 0     Fall Risk  05/31/2019 01/20/2019 10/20/2018 03/09/2018 09/10/2017  Falls in the past year? 0 0 0 No No  Number falls in past yr: 0 0 0 - -  Injury with Fall? 0 0 0 - -  Follow up Falls evaluation completed;Education provided - Falls evaluation completed - -      PHYSICAL EXAM: BP 120/60 Comment: taken from previous visit  Ht 5\' 10"  (1.778 m)   Wt 200 lb (90.7 kg) Comment: taken from previous visit  BMI 28.70 kg/m    Wt Readings from Last 3 Encounters:  05/31/19 200 lb (90.7 kg)  01/20/19 200 lb (90.7 kg)  10/20/18 196 lb (88.9 kg)       Education/Counseling provided regarding diet and exercise, prevention of chronic diseases, smoking/tobacco cessation, if applicable, and reviewed "Covered Medicare Preventive Services."

## 2019-06-15 ENCOUNTER — Other Ambulatory Visit: Payer: Self-pay

## 2019-06-15 ENCOUNTER — Encounter: Payer: Self-pay | Admitting: Family Medicine

## 2019-06-15 ENCOUNTER — Ambulatory Visit (INDEPENDENT_AMBULATORY_CARE_PROVIDER_SITE_OTHER): Payer: Medicare Other | Admitting: Family Medicine

## 2019-06-15 VITALS — BP 142/78 | HR 100 | Temp 98.7°F | Wt 194.6 lb

## 2019-06-15 DIAGNOSIS — K5900 Constipation, unspecified: Secondary | ICD-10-CM | POA: Diagnosis not present

## 2019-06-15 NOTE — Progress Notes (Signed)
Subjective:  Patient ID: Gabriel Wu, male    DOB: [birth year]  Age: 78 y.o. MRN: [id number]  CC:  Chief Complaint  Patient presents with  . Constipation    have the urge to go but cant go to the bathroom. last time was sun morning but only a little came out but still felt like I needed to go. Little lower abd pain    HPI Gabriel Wu presents for   Constipation: Hard stools for past week, has had some liquid stool/diarrhea at times.  Strained to have BM 4 days ago - initial ard balls, then liquid stool few minutes later.  Lower abdomen sore after starting to have BM, otherwise not hurting.   Tx: none.   No fever, no blood in stool no n/v.  No urinary difficulties.   History Patient Active Problem List   Diagnosis Date Noted  . DM (diabetes mellitus) (HCC) 05/12/2016  . Hyperlipemia   . Chest pain 04/12/2016  . Essential hypertension 04/12/2016  . Dyspepsia 04/12/2016   Past Medical History:  Diagnosis Date  . Hypertension   . Urolithiasis    Past stone spontaneously per patient.   No past surgical history on file. No Known Allergies Prior to Admission medications   Medication Sig Start Date End Date Taking? Authorizing Provider  aspirin 81 MG EC tablet Take 1 tablet (81 mg total) by mouth daily. 08/11/16  Yes Doristine Bosworth, MD  atorvastatin (LIPITOR) 40 MG tablet Take 1 tablet (40 mg total) by mouth daily at 6 PM. 01/20/19  Yes [name], MD  lisinopril-hydrochlorothiazide (ZESTORETIC) 20-12.5 MG tablet Take 1 tablet by mouth daily. 01/20/19  Yes [name], MD  metFORMIN (GLUCOPHAGE) 500 MG tablet Take 1 tablet (500 mg total) by mouth 2 (two) times daily with a meal. 01/20/19  Yes [name], MD  omeprazole (PRILOSEC) 20 MG capsule Take 1 capsule (20 mg total) by mouth daily. 09/07/18  Yes [name], MD   Social History   Socioeconomic History  . Marital status: Single    Spouse name: Not on file  . Number of children: Not  on file  . Years of education: Not on file  . Highest education level: Not on file  Occupational History  . Not on file  Social Needs  . Financial resource strain: Not on file  . Food insecurity    Worry: Not on file    Inability: Not on file  . Transportation needs    Medical: Not on file    Non-medical: Not on file  Tobacco Use  . Smoking status: Former Smoker    Years: 50.00    Types: Cigars    Quit date: 07/21/2010    Years since quitting: 8.9  . Smokeless tobacco: Never Used  Substance and Sexual Activity  . Alcohol use: No  . Drug use: No  . Sexual activity: Never  Lifestyle  . Physical activity    Days per week: Not on file    Minutes per session: Not on file  . Stress: Not on file  Relationships  . Social Musician on phone: Not on file    Gets together: Not on file    Attends religious service: Not on file    Active member of club or organization: Not on file    Attends meetings of clubs or organizations: Not on file    Relationship status: Not on file  . Intimate partner violence  Fear of current or ex partner: Not on file    Emotionally abused: Not on file    Physically abused: Not on file    Forced sexual activity: Not on file  Other Topics Concern  . Not on file  Social History Narrative  . Not on file    Review of Systems   Objective:   Vitals:   06/15/19 1647 06/15/19 1737  BP: (!) 169/83 (!) 142/78  Pulse: 100   Temp: 98.7 F (37.1 C)   TempSrc: Oral   SpO2: 100%   Weight: 194 lb 9.6 oz (88.3 kg)      Physical Exam Vitals signs reviewed.  Constitutional:      General: He is not in acute distress.    Appearance: He is well-developed.  HENT:     Head: Normocephalic and atraumatic.  Cardiovascular:     Rate and Rhythm: Normal rate.  Pulmonary:     Effort: Pulmonary effort is normal.  Abdominal:     General: Abdomen is flat. Bowel sounds are normal. There is no distension.     Palpations: Abdomen is soft.      Tenderness: There is no abdominal tenderness. There is no right CVA tenderness, left CVA tenderness, guarding or rebound.  Genitourinary:    Comments: Small amount of hard stool in rectum.  No appreciable fecal impaction. Neurological:     Mental Status: He is alert and oriented to person, place, and time.     Assessment & Plan:  Gabriel Wu is a 78 y.o. male . Constipation, unspecified constipation type   - no apparent impaction, reassuring abdominal exam.   -trial of miralax +/- fleets enema if needed.   - handout given rtc/er precautions.    No orders of the defined types were placed in this encounter.  Patient Instructions       If you have lab work done today you will be contacted with your lab results within the next 2 weeks.  If you have not heard from [place] then please contact us. The fastest way to get your results is to register for My Chart.   IF you received an x-ray today, you will receive an invoice from [facility]. Please contact [facility] at [phone] with questions or concerns regarding your invoice.   IF you received labwork today, you will receive an invoice from [place]. Please contact [facility] at [phone] with questions or concerns regarding your invoice.   Our billing staff will not be able to assist you with questions regarding bills from these companies.  You will be contacted with the lab results as soon as they are available. The fastest way to get your results is to activate your My Chart account. Instructions are located on the last page of this paperwork. If you have not heard from [place] regarding the results in 2 weeks, please contact this office.          Signed, Merri Ray, MD [facility]

## 2019-06-15 NOTE — Patient Instructions (Addendum)
Try miralax once or twice per day until soft stools. Stop if diarrhea. Make sure to drink plenty of water and see info below. Fleet enema also option if needed - can ask pharmacist if needed.  Return to the clinic or go to the nearest emergency room if any of your symptoms worsen or new symptoms occur.   Constipation, Adult Constipation is when a person has fewer bowel movements in a week than normal, has difficulty having a bowel movement, or has stools that are dry, hard, or larger than normal. Constipation may be caused by an underlying condition. It may become worse with age if a person takes certain medicines and does not take in enough fluids. Follow these instructions at home: Eating and drinking   Eat foods that have a lot of fiber, such as fresh fruits and vegetables, whole grains, and beans.  Limit foods that are high in fat, low in fiber, or overly processed, such as french fries, hamburgers, cookies, candies, and soda.  Drink enough fluid to keep your urine clear or pale yellow. General instructions  Exercise regularly or as told by your health care provider.  Go to the restroom when you have the urge to go. Do not hold it in.  Take over-the-counter and prescription medicines only as told by your health care provider. These include any fiber supplements.  Practice pelvic floor retraining exercises, such as deep breathing while relaxing the lower abdomen and pelvic floor relaxation during bowel movements.  Watch your condition for any changes.  Keep all follow-up visits as told by your health care provider. This is important. Contact a health care provider if:  You have pain that gets worse.  You have a fever.  You do not have a bowel movement after 4 days.  You vomit.  You are not hungry.  You lose weight.  You are bleeding from the anus.  You have thin, pencil-like stools. Get help right away if:  You have a fever and your symptoms suddenly get  worse.  You leak stool or have blood in your stool.  Your abdomen is bloated.  You have severe pain in your abdomen.  You feel dizzy or you faint. This information is not intended to replace advice given to you by your health care provider. Make sure you discuss any questions you have with your health care provider. Document Released: 04/04/2004 Document Revised: 06/19/2017 Document Reviewed: 12/26/2015 Elsevier Patient Education  [employer].   If you have lab work done today you will be contacted with your lab results within the next 2 weeks.  If you have not heard from [place] then please contact us. The fastest way to get your results is to register for My Chart.   IF you received an x-ray today, you will receive an invoice from [facility]. Please contact [facility] at [phone] with questions or concerns regarding your invoice.   IF you received labwork today, you will receive an invoice from [place]. Please contact [facility] at [phone] with questions or concerns regarding your invoice.   Our billing staff will not be able to assist you with questions regarding bills from these companies.  You will be contacted with the lab results as soon as they are available. The fastest way to get your results is to activate your My Chart account. Instructions are located on the last page of this paperwork. If you have not heard from [place] regarding the results in 2 weeks, please contact this office.

## 2019-08-01 DIAGNOSIS — E119 Type 2 diabetes mellitus without complications: Secondary | ICD-10-CM | POA: Diagnosis not present

## 2019-08-01 DIAGNOSIS — H2513 Age-related nuclear cataract, bilateral: Secondary | ICD-10-CM | POA: Diagnosis not present

## 2019-08-01 DIAGNOSIS — H40022 Open angle with borderline findings, high risk, left eye: Secondary | ICD-10-CM | POA: Diagnosis not present

## 2019-08-01 DIAGNOSIS — H401112 Primary open-angle glaucoma, right eye, moderate stage: Secondary | ICD-10-CM | POA: Diagnosis not present

## 2019-08-29 ENCOUNTER — Other Ambulatory Visit: Payer: Self-pay | Admitting: Family Medicine

## 2019-08-29 DIAGNOSIS — E785 Hyperlipidemia, unspecified: Secondary | ICD-10-CM

## 2019-08-29 DIAGNOSIS — I10 Essential (primary) hypertension: Secondary | ICD-10-CM

## 2019-08-29 DIAGNOSIS — K219 Gastro-esophageal reflux disease without esophagitis: Secondary | ICD-10-CM

## 2019-08-29 DIAGNOSIS — E1129 Type 2 diabetes mellitus with other diabetic kidney complication: Secondary | ICD-10-CM

## 2019-08-29 MED ORDER — LISINOPRIL-HYDROCHLOROTHIAZIDE 20-12.5 MG PO TABS: 1.0000 | ORAL_TABLET | Freq: Every day | ORAL | 1 refills | Status: DC

## 2019-08-29 MED ORDER — OMEPRAZOLE 20 MG PO CPDR: 20.0000 mg | DELAYED_RELEASE_CAPSULE | Freq: Every day | ORAL | 3 refills | Status: DC

## 2019-08-29 MED ORDER — ATORVASTATIN CALCIUM 40 MG PO TABS: 40.0000 mg | ORAL_TABLET | Freq: Every day | ORAL | 0 refills | Status: DC

## 2019-08-29 MED ORDER — ASPIRIN 81 MG PO TBEC: 81.0000 mg | DELAYED_RELEASE_TABLET | Freq: Every day | ORAL | 3 refills | Status: AC

## 2019-08-29 MED ORDER — METFORMIN HCL 500 MG PO TABS: 500.0000 mg | ORAL_TABLET | Freq: Two times a day (BID) | ORAL | 1 refills | Status: DC

## 2019-08-29 NOTE — Telephone Encounter (Signed)
Requested Prescriptions  Pending Prescriptions Disp Refills  . atorvastatin (LIPITOR) 40 MG tablet 90 tablet 0    Sig: Take 1 tablet (40 mg total) by mouth daily at 6 PM.     Cardiovascular:  Antilipid - Statins Failed - 08/29/2019  9:44 AM      Failed - Total Cholesterol in normal range and within 360 days    Cholesterol, Total  Date Value Ref Range Status  09/07/2018 88 (L) 100 - 199 mg/dL Final         Passed - LDL in normal range and within 360 days    LDL Calculated  Date Value Ref Range Status  09/07/2018 27 0 - 99 mg/dL Final         Passed - HDL in normal range and within 360 days    HDL  Date Value Ref Range Status  09/07/2018 40 >39 mg/dL Final         Passed - Triglycerides in normal range and within 360 days    Triglycerides  Date Value Ref Range Status  09/07/2018 103 0 - 149 mg/dL Final         Passed - Patient is not pregnant      Passed - Valid encounter within last 12 months    Recent Outpatient Visits          2 months ago Constipation, unspecified constipation type   [facility] at [facility], Ranell Patrick, MD   3 months ago Medicare annual wellness visit, subsequent   Primary Care at [facility], Arlie Solomons, MD   7 months ago Essential hypertension   [facility] at [facility], Ranell Patrick, MD   10 months ago Essential hypertension   [facility] at [facility], Ranell Patrick, MD   11 months ago Essential hypertension   [facility] at [facility], Ranell Patrick, MD             . lisinopril-hydrochlorothiazide (ZESTORETIC) 20-12.5 MG tablet 90 tablet 1    Sig: Take 1 tablet by mouth daily.     Cardiovascular:  ACEI + Diuretic Combos Failed - 08/29/2019  9:44 AM      Failed - Na in normal range and within 180 days    Sodium  Date Value Ref Range Status  09/07/2018 137 134 - 144 mmol/L Final         Failed - K in normal range and within 180 days    Potassium  Date Value Ref Range Status  09/07/2018 3.9 3.5 - 5.2 mmol/L Final        Failed - Cr in normal range and within 180 days    Creat  Date Value Ref Range Status  05/12/2016 0.93 0.70 - 1.18 mg/dL Final    Comment:      For patients > or = 79 years of age: The upper reference limit for Creatinine is approximately 13% higher for people identified as African-American.      Creatinine, Ser  Date Value Ref Range Status  09/07/2018 0.97 0.76 - 1.27 mg/dL Final         Failed - Ca in normal range and within 180 days    Calcium  Date Value Ref Range Status  09/07/2018 9.7 8.6 - 10.2 mg/dL Final         Failed - Last BP in normal range    BP Readings from Last 1 Encounters:  06/15/19 (!) 142/78         Passed -  Patient is not pregnant      Passed - Valid encounter within last 6 months    Recent Outpatient Visits          2 months ago Constipation, unspecified constipation type   [facility] at [facility], Ranell Patrick, MD   3 months ago Medicare annual wellness visit, subsequent   Primary Care at [facility], Arlie Solomons, MD   7 months ago Essential hypertension   [facility] at [facility], Ranell Patrick, MD   10 months ago Essential hypertension   [facility] at [facility], Ranell Patrick, MD   11 months ago Essential hypertension   [facility] at [facility], Ranell Patrick, MD             . metFORMIN (GLUCOPHAGE) 500 MG tablet 180 tablet 1    Sig: Take 1 tablet (500 mg total) by mouth 2 (two) times daily with a meal.     Endocrinology:  Diabetes - Biguanides Failed - 08/29/2019  9:44 AM      Failed - HBA1C is between 0 and 7.9 and within 180 days    Hemoglobin A1C  Date Value Ref Range Status  09/07/2018 6.4 (A) 4.0 - 5.6 % Final   Hgb A1c MFr Bld  Date Value Ref Range Status  03/09/2018 6.5 (H) 4.8 - 5.6 % Final    Comment:             Prediabetes: 5.7 - 6.4          Diabetes: >6.4          Glycemic control for adults with diabetes: <7.0          Passed - Cr in normal range and within 360 days    Creat  Date Value Ref Range  Status  05/12/2016 0.93 0.70 - 1.18 mg/dL Final    Comment:      For patients > or = 79 years of age: The upper reference limit for Creatinine is approximately 13% higher for people identified as African-American.      Creatinine, Ser  Date Value Ref Range Status  09/07/2018 0.97 0.76 - 1.27 mg/dL Final         Passed - eGFR in normal range and within 360 days    GFR calc Af Amer  Date Value Ref Range Status  09/07/2018 87 >59 mL/min/1.73 Final   GFR calc non Af Amer  Date Value Ref Range Status  09/07/2018 75 >59 mL/min/1.73 Final         Passed - Valid encounter within last 6 months    Recent Outpatient Visits          2 months ago Constipation, unspecified constipation type   [facility] at [facility], Ranell Patrick, MD   3 months ago Medicare annual wellness visit, subsequent   Primary Care at [facility], Arlie Solomons, MD   7 months ago Essential hypertension   [facility] at [facility], Ranell Patrick, MD   10 months ago Essential hypertension   [facility] at [facility], Ranell Patrick, MD   11 months ago Essential hypertension   [facility] at [facility], Ranell Patrick, MD             . omeprazole (PRILOSEC) 20 MG capsule 90 capsule 3    Sig: Take 1 capsule (20 mg total) by mouth daily.     Gastroenterology: Proton Pump Inhibitors Passed - 08/29/2019  9:44 AM  Passed - Valid encounter within last 12 months    Recent Outpatient Visits          2 months ago Constipation, unspecified constipation type   [facility] at [facility], Ranell Patrick, MD   3 months ago Medicare annual wellness visit, subsequent   Primary Care at [facility], Arlie Solomons, MD   7 months ago Essential hypertension   [facility] at [facility], Ranell Patrick, MD   10 months ago Essential hypertension   [facility] at [facility], Ranell Patrick, MD   11 months ago Essential hypertension   Primary Care at [facility], Ranell Patrick, MD             . aspirin 81 MG EC tablet 90  tablet 3    Sig: Take 1 tablet (81 mg total) by mouth daily.     Analgesics:  NSAIDS - aspirin Passed - 08/29/2019  9:44 AM      Passed - Patient is not pregnant      Passed - Valid encounter within last 12 months    Recent Outpatient Visits          2 months ago Constipation, unspecified constipation type   [facility] at [facility], Ranell Patrick, MD   3 months ago Medicare annual wellness visit, subsequent   Primary Care at [facility], Arlie Solomons, MD   7 months ago Essential hypertension   [facility] at [facility], Ranell Patrick, MD   10 months ago Essential hypertension   [facility] at [facility], Ranell Patrick, MD   11 months ago Essential hypertension   Primary Care at [facility], Ranell Patrick, MD

## 2019-08-29 NOTE — Telephone Encounter (Signed)
Medication Refill - Medication:  atorvastatin (LIPITOR) 40 MG tablet lisinopril-hydrochlorothiazide (ZESTORETIC) 20-12.5 MG tablet metFORMIN (GLUCOPHAGE) 500 MG tablet omeprazole (PRILOSEC) 20 MG capsule  aspirin 81 MG EC tablet    Has the patient contacted their pharmacy?  Yes advised to call  Preferred Pharmacy (with phone number or street name):  [id number] - [place] ([place]), [place] - [address] Phone:  [phone]  Fax:  [phone]       Agent: Please be advised that RX refills may take up to 3 business days. We ask that you follow-up with your pharmacy.

## 2019-10-12 ENCOUNTER — Other Ambulatory Visit: Payer: Self-pay | Admitting: Family Medicine

## 2019-10-12 DIAGNOSIS — K219 Gastro-esophageal reflux disease without esophagitis: Secondary | ICD-10-CM

## 2019-10-12 NOTE — Telephone Encounter (Signed)
Medication Refill - Medication: omeprazole (PRILOSEC) 20 MG capsule   Has the patient contacted their pharmacy? No. (Agent: If no, request that the patient contact the pharmacy for the refill.) (Agent: If yes, when and what did the pharmacy advise?)  Preferred Pharmacy (with phone number or street name):  [id number] - [place] ([place]), [place] - [address] Phone:  [phone]  Fax:  [phone]       Agent: Please be advised that RX refills may take up to 3 business days. We ask that you follow-up with your pharmacy.

## 2019-11-28 ENCOUNTER — Other Ambulatory Visit: Payer: Self-pay | Admitting: Family Medicine

## 2019-11-28 DIAGNOSIS — E785 Hyperlipidemia, unspecified: Secondary | ICD-10-CM

## 2020-01-30 LAB — HM DIABETES EYE EXAM

## 2020-03-05 ENCOUNTER — Other Ambulatory Visit: Payer: Self-pay | Admitting: Family Medicine

## 2020-03-05 DIAGNOSIS — I10 Essential (primary) hypertension: Secondary | ICD-10-CM

## 2020-03-05 NOTE — Telephone Encounter (Signed)
Courtesy refill. Sent message to make appointment. 

## 2020-04-05 ENCOUNTER — Other Ambulatory Visit: Payer: Self-pay | Admitting: Family Medicine

## 2020-04-05 DIAGNOSIS — I10 Essential (primary) hypertension: Secondary | ICD-10-CM

## 2020-04-05 DIAGNOSIS — E1129 Type 2 diabetes mellitus with other diabetic kidney complication: Secondary | ICD-10-CM

## 2020-04-05 NOTE — Telephone Encounter (Signed)
Requested medication (s) are due for refill today: yes   Requested medication (s) are on the active medication list: yes   Last refill:  01/02/2020  Future visit scheduled: no  Notes to clinic:  overdue for follow up appointment    Requested Prescriptions  Pending Prescriptions Disp Refills   lisinopril-hydrochlorothiazide (ZESTORETIC) 20-12.5 MG tablet [Pharmacy Med Name: Lisinopril-hydroCHLOROthiazide 20-12.5 MG Oral Tablet] 30 tablet 0    Sig: TAKE 1 TABLET BY MOUTH ONCE DAILY ( PATIEN NEEDS TO MAKE AN APPOINTMENT FOR FURTHER REFILLS )      Cardiovascular:  ACEI + Diuretic Combos Failed - 04/05/2020  2:35 PM      Failed - Na in normal range and within 180 days    Sodium  Date Value Ref Range Status  09/07/2018 137 134 - 144 mmol/L Final          Failed - K in normal range and within 180 days    Potassium  Date Value Ref Range Status  09/07/2018 3.9 3.5 - 5.2 mmol/L Final          Failed - Cr in normal range and within 180 days    Creat  Date Value Ref Range Status  05/12/2016 0.93 0.70 - 1.18 mg/dL Final    Comment:      For patients > or = 79 years of age: The upper reference limit for Creatinine is approximately 13% higher for people identified as African-American.      Creatinine, Ser  Date Value Ref Range Status  09/07/2018 0.97 0.76 - 1.27 mg/dL Final          Failed - Ca in normal range and within 180 days    Calcium  Date Value Ref Range Status  09/07/2018 9.7 8.6 - 10.2 mg/dL Final          Failed - Last BP in normal range    BP Readings from Last 1 Encounters:  06/15/19 (!) 142/78          Failed - Valid encounter within last 6 months    Recent Outpatient Visits           9 months ago Constipation, unspecified constipation type   [facility] at [facility], Ranell Patrick, MD   10 months ago Medicare annual wellness visit, subsequent   Primary Care at [facility], Arlie Solomons, MD   1 year ago Essential hypertension   Primary Care at  [facility], Ranell Patrick, MD   1 year ago Essential hypertension   [facility] at [facility], Ranell Patrick, MD   1 year ago Essential hypertension   [facility] Care at [facility], Ranell Patrick, MD              Passed - Patient is not pregnant        metFORMIN (GLUCOPHAGE) 500 MG tablet [Pharmacy Med Name: metFORMIN HCl 500 MG Oral Tablet] 180 tablet 0    Sig: TAKE 1 TABLET BY MOUTH TWICE DAILY WITH A MEAL      Endocrinology:  Diabetes - Biguanides Failed - 04/05/2020  2:35 PM      Failed - Cr in normal range and within 360 days    Creat  Date Value Ref Range Status  05/12/2016 0.93 0.70 - 1.18 mg/dL Final    Comment:      For patients > or = 79 years of age: The upper reference limit for Creatinine is approximately 13% higher for people identified as African-American.  Creatinine, Ser  Date Value Ref Range Status  09/07/2018 0.97 0.76 - 1.27 mg/dL Final          Failed - HBA1C is between 0 and 7.9 and within 180 days    Hemoglobin A1C  Date Value Ref Range Status  09/07/2018 6.4 (A) 4.0 - 5.6 % Final   Hgb A1c MFr Bld  Date Value Ref Range Status  03/09/2018 6.5 (H) 4.8 - 5.6 % Final    Comment:             Prediabetes: 5.7 - 6.4          Diabetes: >6.4          Glycemic control for adults with diabetes: <7.0           Failed - eGFR in normal range and within 360 days    GFR calc Af Amer  Date Value Ref Range Status  09/07/2018 87 >59 mL/min/1.73 Final   GFR calc non Af Amer  Date Value Ref Range Status  09/07/2018 75 >59 mL/min/1.73 Final          Failed - Valid encounter within last 6 months    Recent Outpatient Visits           9 months ago Constipation, unspecified constipation type   [facility] at [facility], Ranell Patrick, MD   10 months ago Medicare annual wellness visit, subsequent   Primary Care at [facility], Arlie Solomons, MD   1 year ago Essential hypertension   [facility] at [facility], Ranell Patrick, MD   1 year ago Essential  hypertension   [facility] at [facility], Ranell Patrick, MD   1 year ago Essential hypertension   Primary Care at [facility], Ranell Patrick, MD

## 2020-05-07 ENCOUNTER — Other Ambulatory Visit: Payer: Self-pay | Admitting: Family Medicine

## 2020-05-07 DIAGNOSIS — I10 Essential (primary) hypertension: Secondary | ICD-10-CM

## 2020-05-10 ENCOUNTER — Other Ambulatory Visit: Payer: Self-pay | Admitting: Family Medicine

## 2020-05-10 DIAGNOSIS — I10 Essential (primary) hypertension: Secondary | ICD-10-CM

## 2020-05-10 MED ORDER — LISINOPRIL-HYDROCHLOROTHIAZIDE 20-12.5 MG PO TABS: ORAL_TABLET | ORAL | 0 refills | Status: DC

## 2020-05-10 NOTE — Telephone Encounter (Signed)
PT need a refill  lisinopril-hydrochlorothiazide (ZESTORETIC) 20-12.5 MG tablet [[id number]]  [id number] - [place] ([place]), [place] - [address]  [address] [place] ([place]) [place] [address]  Phone:  [phone] Fax:  [phone]

## 2020-05-10 NOTE — Telephone Encounter (Signed)
Requested Prescriptions  Pending Prescriptions Disp Refills  . lisinopril-hydrochlorothiazide (ZESTORETIC) 20-12.5 MG tablet 10 tablet 0    Sig: TAKE 1 TABLET BY MOUTH ONCE DAILY     Cardiovascular:  ACEI + Diuretic Combos Failed - 05/10/2020 10:50 AM      Failed - Na in normal range and within 180 days    Sodium  Date Value Ref Range Status  09/07/2018 137 134 - 144 mmol/L Final         Failed - K in normal range and within 180 days    Potassium  Date Value Ref Range Status  09/07/2018 3.9 3.5 - 5.2 mmol/L Final         Failed - Cr in normal range and within 180 days    Creat  Date Value Ref Range Status  05/12/2016 0.93 0.70 - 1.18 mg/dL Final    Comment:      For patients > or = 79 years of age: The upper reference limit for Creatinine is approximately 13% higher for people identified as African-American.      Creatinine, Ser  Date Value Ref Range Status  09/07/2018 0.97 0.76 - 1.27 mg/dL Final         Failed - Ca in normal range and within 180 days    Calcium  Date Value Ref Range Status  09/07/2018 9.7 8.6 - 10.2 mg/dL Final         Failed - Last BP in normal range    BP Readings from Last 1 Encounters:  06/15/19 (!) 142/78         Failed - Valid encounter within last 6 months    Recent Outpatient Visits          11 months ago Constipation, unspecified constipation type   [facility] at [facility], Asencion Partridge, MD   11 months ago Medicare annual wellness visit, subsequent   Primary Care at [facility], Manus Rudd, MD   1 year ago Essential hypertension   [facility] at [facility], Asencion Partridge, MD   1 year ago Essential hypertension   [facility] at [facility], Asencion Partridge, MD   1 year ago Essential hypertension   [facility] Care at [facility], Asencion Partridge, MD      Future Appointments            In 4 days Janeece Agee, NP Primary Care at [place]           Passed - Patient is not pregnant       Phone call to pt.  Advised he will  need to sched. F/u office appt. To receive refills on medication.  Transferred to Scheduler for an appt. On 05/14/20.  Courtesy refill given; #10 tablets; no refills.

## 2020-05-14 ENCOUNTER — Ambulatory Visit (INDEPENDENT_AMBULATORY_CARE_PROVIDER_SITE_OTHER): Payer: Medicare Other | Admitting: Registered Nurse

## 2020-05-14 ENCOUNTER — Encounter: Payer: Self-pay | Admitting: Registered Nurse

## 2020-05-14 ENCOUNTER — Other Ambulatory Visit: Payer: Self-pay

## 2020-05-14 VITALS — BP 167/96 | HR 111 | Temp 98.0°F | Resp 18 | Ht 70.0 in | Wt 207.0 lb

## 2020-05-14 DIAGNOSIS — Z23 Encounter for immunization: Secondary | ICD-10-CM

## 2020-05-14 DIAGNOSIS — E119 Type 2 diabetes mellitus without complications: Secondary | ICD-10-CM

## 2020-05-14 LAB — GLUCOSE, POCT (MANUAL RESULT ENTRY): POC Glucose: 142 mg/dl — AB (ref 70–99)

## 2020-05-14 LAB — POCT GLYCOSYLATED HEMOGLOBIN (HGB A1C): Hemoglobin A1C: 7.2 % — AB (ref 4.0–5.6)

## 2020-05-14 NOTE — Patient Instructions (Signed)
° ° ° °  If you have lab work done today you will be contacted with your lab results within the next 2 weeks.  If you have not heard from us then please contact us. The fastest way to get your results is to register for My Chart. ° ° °IF you received an x-ray today, you will receive an invoice from [facility]. Please contact [facility] at [phone] with questions or concerns regarding your invoice.  ° °IF you received labwork today, you will receive an invoice from [facility]. Please contact [facility] at [phone] with questions or concerns regarding your invoice.  ° °Our billing staff will not be able to assist you with questions regarding bills from these companies. ° °You will be contacted with the lab results as soon as they are available. The fastest way to get your results is to activate your My Chart account. Instructions are located on the last page of this paperwork. If you have not heard from us regarding the results in 2 weeks, please contact this office. °  ° ° ° °

## 2020-05-15 LAB — LIPID PANEL
Chol/HDL Ratio: 3 ratio (ref 0.0–5.0)
Cholesterol, Total: 121 mg/dL (ref 100–199)
HDL: 41 mg/dL (ref >39)
LDL Chol Calc (NIH): 53 mg/dL (ref 0–99)
Triglycerides: 159 mg/dL — ABNORMAL HIGH (ref 0–149)
VLDL Cholesterol Cal: 27 mg/dL (ref 5–40)

## 2020-05-15 LAB — COMPREHENSIVE METABOLIC PANEL
ALT: 17 IU/L (ref 0–44)
AST: 19 IU/L (ref 0–40)
Albumin/Globulin Ratio: 1.5 (ref 1.2–2.2)
Albumin: 4.7 g/dL (ref 3.7–4.7)
Alkaline Phosphatase: 82 IU/L (ref 44–121)
BUN/Creatinine Ratio: 13 (ref 10–24)
BUN: 16 mg/dL (ref 8–27)
Bilirubin Total: 0.4 mg/dL (ref 0.0–1.2)
CO2: 25 mmol/L (ref 20–29)
Calcium: 9.8 mg/dL (ref 8.6–10.2)
Chloride: 98 mmol/L (ref 96–106)
Creatinine, Ser: 1.24 mg/dL (ref 0.76–1.27)
GFR calc Af Amer: 64 mL/min/{1.73_m2} (ref >59)
GFR calc non Af Amer: 55 mL/min/{1.73_m2} — ABNORMAL LOW (ref >59)
Globulin, Total: 3.1 g/dL (ref 1.5–4.5)
Glucose: 120 mg/dL — ABNORMAL HIGH (ref 65–99)
Potassium: 4 mmol/L (ref 3.5–5.2)
Sodium: 137 mmol/L (ref 134–144)
Total Protein: 7.8 g/dL (ref 6.0–8.5)

## 2020-05-18 ENCOUNTER — Other Ambulatory Visit: Payer: Self-pay | Admitting: Family Medicine

## 2020-05-18 DIAGNOSIS — I10 Essential (primary) hypertension: Secondary | ICD-10-CM

## 2020-05-18 NOTE — Telephone Encounter (Signed)
Pt seen Gabriel Wu on 10.25.21 and a refill for lisinopril-hydrochlorothiazide (ZESTORETIC) 20-12.5 MG tablet  Was suppose to be called into pharmacy/ please advise and send today / pt only has 2 pills left/ please advise    [id number] - [place] ([place]), [place] - [address] Phone:  [phone]  Fax:  [phone]

## 2020-05-18 NOTE — Telephone Encounter (Signed)
Requested medication (s) are due for refill today: yes  Requested medication (s) are on the active medication list: yes  Last refill:  05/10/20 #10 0 refills   Future visit scheduled: no ,appt was 4 days ago   Notes to clinic:  do you want to renew prescription greater than 10 tablets? Patient only has 2 tabs left. Please advise      Requested Prescriptions  Pending Prescriptions Disp Refills   lisinopril-hydrochlorothiazide (ZESTORETIC) 20-12.5 MG tablet 10 tablet 0    Sig: TAKE 1 TABLET BY MOUTH ONCE DAILY      Cardiovascular:  ACEI + Diuretic Combos Failed - 05/18/2020  3:30 PM      Failed - Last BP in normal range    BP Readings from Last 1 Encounters:  05/14/20 (!) 167/96          Passed - Na in normal range and within 180 days    Sodium  Date Value Ref Range Status  05/14/2020 137 134 - 144 mmol/L Final          Passed - K in normal range and within 180 days    Potassium  Date Value Ref Range Status  05/14/2020 4.0 3.5 - 5.2 mmol/L Final          Passed - Cr in normal range and within 180 days    Creat  Date Value Ref Range Status  05/12/2016 0.93 0.70 - 1.18 mg/dL Final    Comment:      For patients > or = 79 years of age: The upper reference limit for Creatinine is approximately 13% higher for people identified as African-American.      Creatinine, Ser  Date Value Ref Range Status  05/14/2020 1.24 0.76 - 1.27 mg/dL Final          Passed - Ca in normal range and within 180 days    Calcium  Date Value Ref Range Status  05/14/2020 9.8 8.6 - 10.2 mg/dL Final          Passed - Patient is not pregnant      Passed - Valid encounter within last 6 months    Recent Outpatient Visits           4 days ago Flu vaccine need   [facility], Gerlene Burdock, NP   11 months ago Constipation, unspecified constipation type   [facility], Asencion Partridge, MD   11 months ago Medicare annual wellness visit, subsequent   [facility], Manus Rudd, MD   1 year ago Essential hypertension   [facility], Asencion Partridge, MD   1 year ago Essential hypertension   [facility], Asencion Partridge, MD

## 2020-05-19 MED ORDER — LISINOPRIL-HYDROCHLOROTHIAZIDE 20-12.5 MG PO TABS: ORAL_TABLET | ORAL | 1 refills | Status: DC

## 2020-05-29 ENCOUNTER — Other Ambulatory Visit: Payer: Self-pay | Admitting: Family Medicine

## 2020-05-29 DIAGNOSIS — E785 Hyperlipidemia, unspecified: Secondary | ICD-10-CM

## 2020-06-04 ENCOUNTER — Encounter: Payer: Self-pay | Admitting: Radiology

## 2020-07-05 ENCOUNTER — Other Ambulatory Visit: Payer: Self-pay | Admitting: Family Medicine

## 2020-07-05 DIAGNOSIS — E1129 Type 2 diabetes mellitus with other diabetic kidney complication: Secondary | ICD-10-CM

## 2020-07-10 ENCOUNTER — Ambulatory Visit (INDEPENDENT_AMBULATORY_CARE_PROVIDER_SITE_OTHER): Payer: Medicare Other | Admitting: Emergency Medicine

## 2020-07-10 VITALS — BP 167/96 | Ht 70.0 in | Wt 210.0 lb

## 2020-07-10 DIAGNOSIS — Z0000 Encounter for general adult medical examination without abnormal findings: Secondary | ICD-10-CM

## 2020-07-10 NOTE — Progress Notes (Signed)
Presents today for [employer] Visit   Date of last exam: 05-14-2020  Interpreter used for this visit? No  I connected with  Gabriel Wu on 07/10/20 by telephone and verified that I am speaking with the correct person using two identifiers.   I discussed the limitations of evaluation and management by telemedicine. The patient expressed understanding and agreed to proceed.  Patient location: home  Provider location: in office  I provided 20 minutes of non face - to - face time during this encounter.  Patient Care Team: [name], MD as PCP - General (Family Medicine)   Other items to address today:  Discussed Immunizations Discussed Eye/Dental  Other Screening: Last screening for diabetes: 05/14/2020 Last lipid screening: 05/14/2020  ADVANCE DIRECTIVES: Discussed: yes On File:no Materials Provided: no  Immunization status:  Immunization History  Administered Date(s) Administered  . Fluad Quad(high Dose 65+) 05/14/2020  . Influenza Split 05/07/2007  . Influenza,inj,Quad PF,6+ Mos 04/13/2016  . Pneumococcal Conjugate-13 09/10/2017  . Pneumococcal Polysaccharide-23 04/13/2016     Health Maintenance Due  Topic Date Due  . TETANUS/TDAP  Never done  . FOOT EXAM  10/20/2019     Functional Status Survey: Is the patient deaf or have difficulty hearing?: No Does the patient have difficulty seeing, even when wearing glasses/contacts?: No Does the patient have difficulty concentrating, remembering, or making decisions?: No Does the patient have difficulty walking or climbing stairs?: No Does the patient have difficulty dressing or bathing?: No Does the patient have difficulty doing errands alone such as visiting a doctor's office or shopping?: No   6CIT Screen 07/10/2020 05/31/2019  What Year? 0 points -  What month? 0 points -  What time? 0 points -  Count back from 20 0 points 2 points  Months in reverse 2 points -  Repeat  phrase 6 points -  Total Score 8 -      Flowsheet Row Office Visit from 05/31/2019 in [facility]  AUDIT-C Score 0       Home Environment:   Lives in one story home No trouble climbing stairs No scattered rugs Yes grab bars Adequate lighting/no clutter   Patient Active Problem List   Diagnosis Date Noted  . DM (diabetes mellitus) (HCC) 05/12/2016  . Hyperlipemia   . Chest pain 04/12/2016  . Essential hypertension 04/12/2016  . Dyspepsia 04/12/2016     Past Medical History:  Diagnosis Date  . Hypertension   . Urolithiasis    Past stone spontaneously per patient.     History reviewed. No pertinent surgical history.   Family History  Problem Relation Age of Onset  . Leukemia Mother   . Heart attack Father   . Stomach cancer Sister      Social History   Socioeconomic History  . Marital status: Single    Spouse name: Not on file  . Number of children: Not on file  . Years of education: Not on file  . Highest education level: Not on file  Occupational History  . Not on file  Tobacco Use  . Smoking status: Former Smoker    Years: 50.00    Types: Cigars    Quit date: 07/21/2010    Years since quitting: 9.9  . Smokeless tobacco: Never Used  Vaping Use  . Vaping Use: Never used  Substance and Sexual Activity  . Alcohol use: No  . Drug use: No  . Sexual activity: Never  Other Topics Concern  .  Not on file  Social History Narrative  . Not on file   Social Determinants of Health   Financial Resource Strain: Not on file  Food Insecurity: Not on file  Transportation Needs: Not on file  Physical Activity: Not on file  Stress: Not on file  Social Connections: Not on file  Intimate Partner Violence: Not on file     No Known Allergies   Prior to Admission medications   Medication Sig Start Date End Date Taking? Authorizing Provider  aspirin 81 MG EC tablet Take 1 tablet (81 mg total) by mouth daily. 08/29/19  Yes [name], MD   atorvastatin (LIPITOR) 40 MG tablet TAKE 1 TABLET BY MOUTH ONCE DAILY AT  6PM. 05/29/20  Yes [name], MD  lisinopril-hydrochlorothiazide (ZESTORETIC) 20-12.5 MG tablet TAKE 1 TABLET BY MOUTH ONCE DAILY 05/19/20  Yes [name], MD  metFORMIN (GLUCOPHAGE) 500 MG tablet TAKE 1 TABLET BY MOUTH TWICE DAILY WITH A MEAL ( NEEDS OFFICE VISIT FOR NEXT REFILL) 07/05/20  Yes [name], MD  omeprazole (PRILOSEC) 20 MG capsule Take 1 capsule (20 mg total) by mouth daily. 08/29/19  Yes [name], MD     Depression screen [facility] 2/9 07/10/2020 05/14/2020 06/15/2019 05/31/2019 01/20/2019  Decreased Interest 0 0 0 0 0  Down, Depressed, Hopeless 0 0 0 0 0  PHQ - 2 Score 0 0 0 0 0     Fall Risk  07/10/2020 05/14/2020 06/15/2019 05/31/2019 01/20/2019  Falls in the past year? 0 0 0 0 0  Number falls in past yr: 0 0 0 0 0  Injury with Fall? 0 0 0 0 0  Follow up Falls evaluation completed;Education provided Falls evaluation completed Falls evaluation completed Falls evaluation completed;Education provided -      PHYSICAL EXAM: BP (!) 167/96 Comment: taken from a previous visit  Ht 5\' 10"  (1.778 m)   Wt 210 lb (95.3 kg)   BMI 30.13 kg/m    Wt Readings from Last 3 Encounters:  07/10/20 210 lb (95.3 kg)  05/14/20 207 lb (93.9 kg)  06/15/19 194 lb 9.6 oz (88.3 kg)       Education/Counseling provided regarding diet and exercise, prevention of chronic diseases, smoking/tobacco cessation, if applicable, and reviewed "Covered Medicare Preventive Services."

## 2020-07-10 NOTE — Patient Instructions (Addendum)
Thank you for taking time to come for your Medicare Wellness Visit. I appreciate your ongoing commitment to your health goals. Please review the following plan we discussed and let me know if I can assist you in the future.  Reika Callanan LPN  Preventive Care 79 Years and Older, Male Preventive care refers to lifestyle choices and visits with your health care provider that can promote health and wellness. This includes:  A yearly physical exam. This is also called an annual well check.  Regular dental and eye exams.  Immunizations.  Screening for certain conditions.  Healthy lifestyle choices, such as diet and exercise. What can I expect for my preventive care visit? Physical exam Your health care provider will check:  Height and weight. These may be used to calculate body mass index (BMI), which is a measurement that tells if you are at a healthy weight.  Heart rate and blood pressure.  Your skin for abnormal spots. Counseling Your health care provider may ask you questions about:  Alcohol, tobacco, and drug use.  Emotional well-being.  Home and relationship well-being.  Sexual activity.  Eating habits.  History of falls.  Memory and ability to understand (cognition).  Work and work environment. What immunizations do I need?  Influenza (flu) vaccine  This is recommended every year. Tetanus, diphtheria, and pertussis (Tdap) vaccine  You may need a Td booster every 10 years. Varicella (chickenpox) vaccine  You may need this vaccine if you have not already been vaccinated. Zoster (shingles) vaccine  You may need this after age 60. Pneumococcal conjugate (PCV13) vaccine  One dose is recommended after age 65. Pneumococcal polysaccharide (PPSV23) vaccine  One dose is recommended after age 65. Measles, mumps, and rubella (MMR) vaccine  You may need at least one dose of MMR if you were born in 1957 or later. You may also need a second dose. Meningococcal  conjugate (MenACWY) vaccine  You may need this if you have certain conditions. Hepatitis A vaccine  You may need this if you have certain conditions or if you travel or work in places where you may be exposed to hepatitis A. Hepatitis B vaccine  You may need this if you have certain conditions or if you travel or work in places where you may be exposed to hepatitis B. Haemophilus influenzae type b (Hib) vaccine  You may need this if you have certain conditions. You may receive vaccines as individual doses or as more than one vaccine together in one shot (combination vaccines). Talk with your health care provider about the risks and benefits of combination vaccines. What tests do I need? Blood tests  Lipid and cholesterol levels. These may be checked every 5 years, or more frequently depending on your overall health.  Hepatitis C test.  Hepatitis B test. Screening  Lung cancer screening. You may have this screening every year starting at age 55 if you have a 30-pack-year history of smoking and currently smoke or have quit within the past 15 years.  Colorectal cancer screening. All adults should have this screening starting at age 50 and continuing until age 75. Your health care provider may recommend screening at age 45 if you are at increased risk. You will have tests every 1-10 years, depending on your results and the type of screening test.  Prostate cancer screening. Recommendations will vary depending on your family history and other risks.  Diabetes screening. This is done by checking your blood sugar (glucose) after you have not eaten for   eaten for a while (fasting). You may have this done every 1-3 years.  Abdominal aortic aneurysm (AAA) screening. You may need this if you are a current or former smoker.  Sexually transmitted disease (STD) testing. Follow these instructions at home: Eating and drinking  Eat a diet that includes fresh fruits and vegetables, whole grains, lean  protein, and low-fat dairy products. Limit your intake of foods with high amounts of sugar, saturated fats, and salt.  Take vitamin and mineral supplements as recommended by your health care provider.  Do not drink alcohol if your health care provider tells you not to drink.  If you drink alcohol: ? Limit how much you have to 0-2 drinks a day. ? Be aware of how much alcohol is in your drink. In the [place] one drink equals one 12 oz bottle of beer (355 mL), one 5 oz glass of wine (148 mL), or one 1 oz glass of hard liquor (44 mL). Lifestyle  Take daily care of your teeth and gums.  Stay active. Exercise for at least 30 minutes on 5 or more days each week.  Do not use any products that contain nicotine or tobacco, such as cigarettes, e-cigarettes, and chewing tobacco. If you need help quitting, ask your health care provider.  If you are sexually active, practice safe sex. Use a condom or other form of protection to prevent STIs (sexually transmitted infections).  Talk with your health care provider about taking a low-dose aspirin or statin. What's next?  Visit your health care provider once a year for a well check visit.  Ask your health care provider how often you should have your eyes and teeth checked.  Stay up to date on all vaccines. This information is not intended to replace advice given to you by your health care provider. Make sure you discuss any questions you have with your health care provider. Document Revised: 07/01/2018 Document Reviewed: 07/01/2018 Elsevier Patient Education  2020 [employer].

## 2020-08-02 ENCOUNTER — Encounter: Payer: Self-pay | Admitting: Registered Nurse

## 2020-08-02 NOTE — Progress Notes (Signed)
Established Patient Office Visit  Subjective:  Patient ID: Gabriel Wu, male    DOB: Jan 25, 1941  Age: 80 y.o. MRN: [id number]  CC:  Chief Complaint  Patient presents with  . Medication Refill    Patient states he is in office today for an medication refill. Patient would like to get a 3 month supply instead of 30 days.    HPI Gabriel Wu presents for medication refill  Hypertension: Patient Currently taking: lisinopril-hctz 20-12.5mg  PO qd Good effect. No AEs. Good compliance Denies CV symptoms including: chest pain, shob, doe, headache, visual changes, fatigue, claudication, and dependent edema.   Previous readings and labs: BP Readings from Last 3 Encounters:  07/10/20 (!) 167/96  05/14/20 (!) 167/96  06/15/19 (!) 142/78   Lab Results  Component Value Date   CREATININE 1.24 05/14/2020    Also taking omeprazole 20mg  PO qd, atorvastatin 40mg  PO qd, and metformin 500mg  PO qd ac. No issues with these medications.   Requests flu shot today.   Past Medical History:  Diagnosis Date  . Hypertension   . Urolithiasis    Past stone spontaneously per patient.    No past surgical history on file.  Family History  Problem Relation Age of Onset  . Leukemia Mother   . Heart attack Father   . Stomach cancer Sister     Social History   Socioeconomic History  . Marital status: Single    Spouse name: Not on file  . Number of children: Not on file  . Years of education: Not on file  . Highest education level: Not on file  Occupational History  . Not on file  Tobacco Use  . Smoking status: Former Smoker    Years: 50.00    Types: Cigars    Quit date: 07/21/2010    Years since quitting: 10.0  . Smokeless tobacco: Never Used  Vaping Use  . Vaping Use: Never used  Substance and Sexual Activity  . Alcohol use: No  . Drug use: No  . Sexual activity: Never  Other Topics Concern  . Not on file  Social History Narrative  . Not on file   Social  Determinants of Health   Financial Resource Strain: Not on file  Food Insecurity: Not on file  Transportation Needs: Not on file  Physical Activity: Not on file  Stress: Not on file  Social Connections: Not on file  Intimate Partner Violence: Not on file    Outpatient Medications Prior to Visit  Medication Sig Dispense Refill  . aspirin 81 MG EC tablet Take 1 tablet (81 mg total) by mouth daily. 90 tablet 3  . omeprazole (PRILOSEC) 20 MG capsule Take 1 capsule (20 mg total) by mouth daily. 90 capsule 3  . atorvastatin (LIPITOR) 40 MG tablet TAKE 1 TABLET BY MOUTH ONCE DAILY AT  6PM. 90 tablet 1  . lisinopril-hydrochlorothiazide (ZESTORETIC) 20-12.5 MG tablet TAKE 1 TABLET BY MOUTH ONCE DAILY 10 tablet 0  . metFORMIN (GLUCOPHAGE) 500 MG tablet TAKE 1 TABLET BY MOUTH TWICE DAILY WITH A MEAL 180 tablet 0   No facility-administered medications prior to visit.    No Known Allergies  ROS Review of Systems  Constitutional: Negative.   HENT: Negative.   Eyes: Negative.   Respiratory: Negative.   Cardiovascular: Negative.   Gastrointestinal: Negative.   Genitourinary: Negative.   Musculoskeletal: Negative.   Skin: Negative.   Neurological: Negative.   Psychiatric/Behavioral: Negative.   All other systems reviewed and are negative.  Objective:    Physical Exam Constitutional:      General: He is not in acute distress.    Appearance: Normal appearance. He is normal weight. He is not ill-appearing, toxic-appearing or diaphoretic.  Cardiovascular:     Rate and Rhythm: Normal rate and regular rhythm.     Heart sounds: Normal heart sounds. No murmur heard. No friction rub. No gallop.   Pulmonary:     Effort: Pulmonary effort is normal. No respiratory distress.     Breath sounds: Normal breath sounds. No stridor. No wheezing, rhonchi or rales.  Chest:     Chest wall: No tenderness.  Neurological:     General: No focal deficit present.     Mental Status: He is alert and  oriented to person, place, and time. Mental status is at baseline.  Psychiatric:        Mood and Affect: Mood normal.        Behavior: Behavior normal.        Thought Content: Thought content normal.        Judgment: Judgment normal.     BP (!) 167/96   Pulse (!) 111   Temp 98 F (36.7 C) (Temporal)   Resp 18   Ht 5\' 10"  (1.778 m)   Wt 207 lb (93.9 kg)   SpO2 97%   BMI 29.70 kg/m  Wt Readings from Last 3 Encounters:  07/10/20 210 lb (95.3 kg)  05/14/20 207 lb (93.9 kg)  06/15/19 194 lb 9.6 oz (88.3 kg)     Health Maintenance Due  Topic Date Due  . COVID-19 Vaccine (1) Never done  . TETANUS/TDAP  Never done  . FOOT EXAM  10/20/2019    There are no preventive care reminders to display for this patient.  No results found for: TSH Lab Results  Component Value Date   WBC 10.3 04/12/2016   HGB 16.1 04/12/2016   HCT 47.6 04/12/2016   MCV 84.7 04/12/2016   PLT 230 04/12/2016   Lab Results  Component Value Date   NA 137 05/14/2020   K 4.0 05/14/2020   CO2 25 05/14/2020   GLUCOSE 120 (H) 05/14/2020   BUN 16 05/14/2020   CREATININE 1.24 05/14/2020   BILITOT 0.4 05/14/2020   ALKPHOS 82 05/14/2020   AST 19 05/14/2020   ALT 17 05/14/2020   PROT 7.8 05/14/2020   ALBUMIN 4.7 05/14/2020   CALCIUM 9.8 05/14/2020   ANIONGAP 8 04/13/2016   Lab Results  Component Value Date   CHOL 121 05/14/2020   Lab Results  Component Value Date   HDL 41 05/14/2020   Lab Results  Component Value Date   LDLCALC 53 05/14/2020   Lab Results  Component Value Date   TRIG 159 (H) 05/14/2020   Lab Results  Component Value Date   CHOLHDL 3.0 05/14/2020   Lab Results  Component Value Date   HGBA1C 7.2 (A) 05/14/2020      Assessment & Plan:   Problem List Items Addressed This Visit      Endocrine   DM (diabetes mellitus) (HCC)   Relevant Orders   POCT glycosylated hemoglobin (Hb A1C) (Completed)   POCT glucose (manual entry) (Completed)   Comprehensive metabolic  panel (Completed)   Lipid Panel (Completed)    Other Visit Diagnoses    Flu vaccine need    -  Primary   Relevant Orders   Flu Vaccine QUAD High Dose(Fluad) (Completed)      No orders of the defined  types were placed in this encounter.   Follow-up: No follow-ups on file.   PLAN  Flu vaccine given  Labs collected. Will follow up with the patient as warranted.  bp still elevated, close follow up in 2-3 mos with PCP Dr. Neva Seat  Patient encouraged to call clinic with any questions, comments, or concerns.  Janeece Agee, NP

## 2020-08-29 ENCOUNTER — Other Ambulatory Visit: Payer: Self-pay | Admitting: Family Medicine

## 2020-08-29 DIAGNOSIS — E785 Hyperlipidemia, unspecified: Secondary | ICD-10-CM

## 2020-10-03 ENCOUNTER — Other Ambulatory Visit: Payer: Self-pay | Admitting: Family Medicine

## 2020-10-03 DIAGNOSIS — E1129 Type 2 diabetes mellitus with other diabetic kidney complication: Secondary | ICD-10-CM

## 2020-11-14 ENCOUNTER — Telehealth: Payer: Self-pay

## 2020-11-14 ENCOUNTER — Other Ambulatory Visit: Payer: Self-pay

## 2020-11-14 DIAGNOSIS — I10 Essential (primary) hypertension: Secondary | ICD-10-CM

## 2020-11-14 DIAGNOSIS — K219 Gastro-esophageal reflux disease without esophagitis: Secondary | ICD-10-CM

## 2020-11-14 MED ORDER — OMEPRAZOLE 20 MG PO CPDR: 20.0000 mg | DELAYED_RELEASE_CAPSULE | Freq: Every day | ORAL | 0 refills | Status: DC

## 2020-11-14 MED ORDER — LISINOPRIL-HYDROCHLOROTHIAZIDE 20-12.5 MG PO TABS: ORAL_TABLET | ORAL | 0 refills | Status: DC

## 2020-11-14 NOTE — Telephone Encounter (Signed)
Medication has been sent to the pharmacy. 

## 2020-11-14 NOTE — Telephone Encounter (Signed)
Patient was last seen on 05/14/20 with Janeece Agee for Med refills.    Patient was previously last seen by PCP on 06/15/19 for an acute issue.  Patient has traditional medicare.  I have scheduled him for a 1 year follow up with Dr. Neva Seat on 12/03/2020.  Patient is requesting refills on:  Lisinopril-hydrochlorothiazide and omeprazole to be sent to [facility] at [facility].

## 2020-12-03 ENCOUNTER — Other Ambulatory Visit: Payer: Self-pay

## 2020-12-03 ENCOUNTER — Encounter: Payer: Self-pay | Admitting: Family Medicine

## 2020-12-03 ENCOUNTER — Ambulatory Visit (INDEPENDENT_AMBULATORY_CARE_PROVIDER_SITE_OTHER): Payer: Medicare HMO | Admitting: Family Medicine

## 2020-12-03 VITALS — BP 140/72 | HR 88 | Temp 98.2°F | Resp 16 | Ht 70.0 in | Wt 207.2 lb

## 2020-12-03 DIAGNOSIS — E1129 Type 2 diabetes mellitus with other diabetic kidney complication: Secondary | ICD-10-CM | POA: Diagnosis not present

## 2020-12-03 DIAGNOSIS — I10 Essential (primary) hypertension: Secondary | ICD-10-CM | POA: Diagnosis not present

## 2020-12-03 DIAGNOSIS — E785 Hyperlipidemia, unspecified: Secondary | ICD-10-CM

## 2020-12-03 DIAGNOSIS — R1031 Right lower quadrant pain: Secondary | ICD-10-CM | POA: Diagnosis not present

## 2020-12-03 DIAGNOSIS — R809 Proteinuria, unspecified: Secondary | ICD-10-CM

## 2020-12-03 DIAGNOSIS — K219 Gastro-esophageal reflux disease without esophagitis: Secondary | ICD-10-CM

## 2020-12-03 DIAGNOSIS — R000 Tachycardia, unspecified: Secondary | ICD-10-CM | POA: Diagnosis not present

## 2020-12-03 LAB — LIPID PANEL
Cholesterol: 110 mg/dL (ref 0–200)
HDL: 41.8 mg/dL (ref >39.00)
LDL Cholesterol: 32 mg/dL (ref 0–99)
NonHDL: 67.89
Total CHOL/HDL Ratio: 3
Triglycerides: 179 mg/dL — ABNORMAL HIGH (ref 0.0–149.0)
VLDL: 35.8 mg/dL (ref 0.0–40.0)

## 2020-12-03 LAB — MICROALBUMIN / CREATININE URINE RATIO
Creatinine,U: 202.2 mg/dL
Microalb Creat Ratio: 2.6 mg/g (ref 0.0–30.0)
Microalb, Ur: 5.2 mg/dL — ABNORMAL HIGH (ref 0.0–1.9)

## 2020-12-03 LAB — COMPREHENSIVE METABOLIC PANEL
ALT: 21 U/L (ref 0–53)
AST: 19 U/L (ref 0–37)
Albumin: 4.9 g/dL (ref 3.5–5.2)
Alkaline Phosphatase: 75 U/L (ref 39–117)
BUN: 22 mg/dL (ref 6–23)
CO2: 25 mEq/L (ref 19–32)
Calcium: 10.2 mg/dL (ref 8.4–10.5)
Chloride: 101 mEq/L (ref 96–112)
Creatinine, Ser: 1.38 mg/dL (ref 0.40–1.50)
GFR: 48.65 mL/min — ABNORMAL LOW (ref >60.00)
Glucose, Bld: 156 mg/dL — ABNORMAL HIGH (ref 70–99)
Potassium: 3.6 mEq/L (ref 3.5–5.1)
Sodium: 138 mEq/L (ref 135–145)
Total Bilirubin: 0.5 mg/dL (ref 0.2–1.2)
Total Protein: 8 g/dL (ref 6.0–8.3)

## 2020-12-03 LAB — CBC WITH DIFFERENTIAL/PLATELET
Basophils Absolute: 0.1 10*3/uL (ref 0.0–0.1)
Basophils Relative: 0.9 % (ref 0.0–3.0)
Eosinophils Absolute: 0.1 10*3/uL (ref 0.0–0.7)
Eosinophils Relative: 0.7 % (ref 0.0–5.0)
HCT: 44.2 % (ref 39.0–52.0)
Hemoglobin: 15.1 g/dL (ref 13.0–17.0)
Lymphocytes Relative: 12.9 % (ref 12.0–46.0)
Lymphs Abs: 1.7 10*3/uL (ref 0.7–4.0)
MCHC: 34.2 g/dL (ref 30.0–36.0)
MCV: 84 fl (ref 78.0–100.0)
Monocytes Absolute: 0.9 10*3/uL (ref 0.1–1.0)
Monocytes Relative: 6.5 % (ref 3.0–12.0)
Neutro Abs: 10.3 10*3/uL — ABNORMAL HIGH (ref 1.4–7.7)
Neutrophils Relative %: 79 % — ABNORMAL HIGH (ref 43.0–77.0)
Platelets: 244 10*3/uL (ref 150.0–400.0)
RBC: 5.26 Mil/uL (ref 4.22–5.81)
RDW: 14.9 % (ref 11.5–15.5)
WBC: 13.1 10*3/uL — ABNORMAL HIGH (ref 4.0–10.5)

## 2020-12-03 LAB — TSH: TSH: 2.58 u[IU]/mL (ref 0.35–4.50)

## 2020-12-03 LAB — HEMOGLOBIN A1C: Hgb A1c MFr Bld: 8.1 % — ABNORMAL HIGH (ref 4.6–6.5)

## 2020-12-03 MED ORDER — ATORVASTATIN CALCIUM 40 MG PO TABS: ORAL_TABLET | ORAL | 1 refills | Status: DC

## 2020-12-03 MED ORDER — OMEPRAZOLE 20 MG PO CPDR: 20.0000 mg | DELAYED_RELEASE_CAPSULE | Freq: Every day | ORAL | 0 refills | Status: DC

## 2020-12-03 MED ORDER — METFORMIN HCL 500 MG PO TABS: ORAL_TABLET | ORAL | 1 refills | Status: DC

## 2020-12-03 MED ORDER — LISINOPRIL-HYDROCHLOROTHIAZIDE 20-12.5 MG PO TABS: ORAL_TABLET | ORAL | 0 refills | Status: DC

## 2020-12-03 NOTE — Patient Instructions (Signed)
I will check some blood work for the abdominal pain and I would like to see you again in 3 days.  If any worsening abdominal pain, fevers, or return of nausea or vomiting be seen right away through an urgent care or emergency room.   No med changes for today.    Abdominal Pain, Adult Pain in the abdomen (abdominal pain) can be caused by many things. Often, abdominal pain is not serious and it gets better with no treatment or by being treated at home. However, sometimes abdominal pain is serious. Your health care provider will ask questions about your medical history and do a physical exam to try to determine the cause of your abdominal pain. Follow these instructions at home: Medicines  Take over-the-counter and prescription medicines only as told by your health care provider.  Do not take a laxative unless told by your health care provider. General instructions  Watch your condition for any changes.  Drink enough fluid to keep your urine pale yellow.  Keep all follow-up visits as told by your health care provider. This is important.   Contact a health care provider if:  Your abdominal pain changes or gets worse.  You are not hungry or you lose weight without trying.  You are constipated or have diarrhea for more than 2-3 days.  You have pain when you urinate or have a bowel movement.  Your abdominal pain wakes you up at night.  Your pain gets worse with meals, after eating, or with certain foods.  You are vomiting and cannot keep anything down.  You have a fever.  You have blood in your urine. Get help right away if:  Your pain does not go away as soon as your health care provider told you to expect.  You cannot stop vomiting.  Your pain is only in areas of the abdomen, such as the right side or the left lower portion of the abdomen. Pain on the right side could be caused by appendicitis.  You have bloody or black stools, or stools that look like tar.  You have  severe pain, cramping, or bloating in your abdomen.  You have signs of dehydration, such as: ? Dark urine, very little urine, or no urine. ? Cracked lips. ? Dry mouth. ? Sunken eyes. ? Sleepiness. ? Weakness.  You have trouble breathing or chest pain. Summary  Often, abdominal pain is not serious and it gets better with no treatment or by being treated at home. However, sometimes abdominal pain is serious.  Watch your condition for any changes.  Take over-the-counter and prescription medicines only as told by your health care provider.  Contact a health care provider if your abdominal pain changes or gets worse.  Get help right away if you have severe pain, cramping, or bloating in your abdomen. This information is not intended to replace advice given to you by your health care provider. Make sure you discuss any questions you have with your health care provider. Document Revised: 08/26/2019 Document Reviewed: 11/15/2018 Elsevier Patient Education  2021 [employer].

## 2020-12-03 NOTE — Progress Notes (Signed)
Subjective:  Patient ID: Gabriel Wu, male    DOB: 12-03-1940  Age: 80 y.o. MRN: [id number]  CC:  Chief Complaint  Patient presents with  . Hyperlipidemia    Pt here for follow up to get refills and do lab work.   . Gastroesophageal Reflux    Pt takes omeprazole but notes having an abdominal pain that is usually strongest in the morning times.   . Hypertension    Pt following up for refill lis HCTZ no concerns, denies physical symptoms     HPI Laksh Hinners presents for  Hypertension: Lisinopril HCTZ 20/12.5 mg daily Home readings: 130 systolic.  BP Readings from Last 3 Encounters:  12/03/20 140/72  07/10/20 (!) 167/96  05/14/20 (!) 167/96   Lab Results  Component Value Date   CREATININE 1.24 05/14/2020   GERD Takes omeprazole daily. Notes return of heartburn at 3am if not taking. No pud. No gastroenterologist.  Lower abdominal pain at times for past month. Worse past 3-4 days. Nausea 3 days ago, no vomiting. No fever. No diarrhea, no blood in stool. BM daily. Nausea better today. R lower abdomen more sore past 3 days. Painful on Friday, sore now. No abdominal surgery in past.  No dysuria/hematuria/urgency/frequency No alcohol.  No dark tarry stools.   Diabetes: With hyperglycemia based on elevated A1c in October.  Treated with metformin 500 mg twice daily.  He is on ACE inhibitor as well as statin as well as aspirin. Home readings - none.  No new side effects with metformin. Soft stools at time, no diarrhea.  No diet or exercise changes since last visit.   Microalbumin: Normal ratio February 2020 Optho, foot exam, pneumovax: due for foot exam.   Diabetic Foot Exam - Simple   Simple Foot Form Visual Inspection See comments: Yes Sensation Testing Intact to touch and monofilament testing bilaterally: Yes Pulse Check Posterior Tibialis and Dorsalis pulse intact bilaterally: Yes Comments Pt has thick overgrown toe nails on both feet, also noted  deformity of second toe, bilateral, toe appears forced to a position pt states has long worn cowboy boots and believes this may be why as they have been this way for a long time.        Lab Results  Component Value Date   HGBA1C 7.2 (A) 05/14/2020   HGBA1C 6.4 (A) 09/07/2018   HGBA1C 6.5 (H) 03/09/2018   Lab Results  Component Value Date   LDLCALC 53 05/14/2020   CREATININE 1.24 05/14/2020   lipitor 40mg  qd.     History Patient Active Problem List   Diagnosis Date Noted  . DM (diabetes mellitus) (HCC) 05/12/2016  . Hyperlipemia   . Chest pain 04/12/2016  . Essential hypertension 04/12/2016  . Dyspepsia 04/12/2016   Past Medical History:  Diagnosis Date  . Hypertension   . Urolithiasis    Past stone spontaneously per patient.   No past surgical history on file. No Known Allergies Prior to Admission medications   Medication Sig Start Date End Date Taking? Authorizing Provider  aspirin 81 MG EC tablet Take 1 tablet (81 mg total) by mouth daily. 08/29/19  Yes 10/27/19, MD  atorvastatin (LIPITOR) 40 MG tablet TAKE 1 TABLET BY MOUTH ONCE DAILY AT  6PM. 08/29/20  Yes 10/27/20, MD  lisinopril-hydrochlorothiazide (ZESTORETIC) 20-12.5 MG tablet TAKE 1 TABLET BY MOUTH ONCE DAILY 11/14/20  Yes 11/16/20, MD  metFORMIN (GLUCOPHAGE) 500 MG tablet TAKE 1 TABLET BY MOUTH TWICE DAILY WITH MEALS (  NEEDS OFFICE VISIT FOR NEXT REFILL) 10/03/20  Yes Shade FloodGreene, Jaena Brocato R, MD  omeprazole (PRILOSEC) 20 MG capsule Take 1 capsule (20 mg total) by mouth daily. 11/14/20  Yes Shade FloodGreene, Nazli Penn R, MD   Social History   Socioeconomic History  . Marital status: Single    Spouse name: Not on file  . Number of children: Not on file  . Years of education: Not on file  . Highest education level: Not on file  Occupational History  . Not on file  Tobacco Use  . Smoking status: Former Smoker    Years: 50.00    Types: Cigars    Quit date: 07/21/2010    Years since quitting: 10.3  .  Smokeless tobacco: Never Used  Vaping Use  . Vaping Use: Never used  Substance and Sexual Activity  . Alcohol use: No  . Drug use: No  . Sexual activity: Never  Other Topics Concern  . Not on file  Social History Narrative  . Not on file   Social Determinants of Health   Financial Resource Strain: Not on file  Food Insecurity: Not on file  Transportation Needs: Not on file  Physical Activity: Not on file  Stress: Not on file  Social Connections: Not on file  Intimate Partner Violence: Not on file    Review of Systems  Constitutional: Negative for fatigue and unexpected weight change.  Eyes: Negative for visual disturbance.  Respiratory: Negative for cough, chest tightness and shortness of breath.   Cardiovascular: Negative for chest pain, palpitations and leg swelling.  Gastrointestinal: Positive for abdominal pain and nausea. Negative for anal bleeding, blood in stool, constipation and diarrhea.  Neurological: Negative for dizziness, light-headedness and headaches.     Objective:   Vitals:   12/03/20 1333 12/03/20 1359  BP: (!) 144/88 140/72  Pulse: 88   Resp: 16   Temp: 98.2 F (36.8 C)   TempSrc: Temporal   SpO2: 96%   Weight: 207 lb 3.2 oz (94 kg)   Height: 5\' 10"  (1.778 m)      Physical Exam Vitals reviewed.  Constitutional:      Appearance: He is well-developed.  HENT:     Head: Normocephalic and atraumatic.  Eyes:     Pupils: Pupils are equal, round, and reactive to light.  Neck:     Vascular: No carotid bruit or JVD.  Cardiovascular:     Rate and Rhythm: Regular rhythm. Tachycardia present.     Heart sounds: Normal heart sounds. No murmur heard.   Pulmonary:     Effort: Pulmonary effort is normal.     Breath sounds: Normal breath sounds. No rales.  Abdominal:     Tenderness: There is abdominal tenderness (RLQ, no rebound/guarding. no hernia palpated. hip nontender syuprapubic area soft/nontender. ).  Skin:    General: Skin is warm and dry.   Neurological:     Mental Status: He is alert and oriented to person, place, and time.      Assessment & Plan:  Sol BlazingDouglas Baack is a 80 y.o. male . Tachycardia - Plan: CBC with Differential/Platelet, TSH  -Check CBC, TSH, asymptomatic.  Recheck 3 days.  Tachycardia noted on prior visit in October with pulse 111 at that time.  Hyperlipidemia, unspecified hyperlipidemia type - Plan: atorvastatin (LIPITOR) 40 MG tablet, Lipid panel  -Tolerating current dose, continue Lipitor, check labs  Essential hypertension - Plan: lisinopril-hydrochlorothiazide (ZESTORETIC) 20-12.5 MG tablet, Comprehensive metabolic panel  -Borderline but stable on current dose lisinopril HCTZ, continue  same  Type 2 diabetes mellitus with microalbuminuria, without long-term current use of insulin (HCC) - Plan: metFORMIN (GLUCOPHAGE) 500 MG tablet, Comprehensive metabolic panel, Hemoglobin A1c, Microalbumin / creatinine urine ratio  -Prior hyperglycemia, microalbuminuria by history, updated urine testing obtained, A1c obtained.  Continue metformin same dose for now  Gastroesophageal reflux disease - Plan: omeprazole (PRILOSEC) 20 MG capsule, Comprehensive metabolic panel  -Stable with daily dosing of omeprazole.  Continue same  RLQ abdominal pain - Plan: CBC with Differential/Platelet  -Intermittent symptoms with increased symptoms past 3 days, now improving slightly.  Afebrile.  No rebound or guarding.  Differential includes infectious causes including appendicitis but less likely given current exam.  Check CBC, 3-day follow-up with ER/urgent care precautions if return of nausea or any worsening pain.  Understanding was expressed.  Meds ordered this encounter  Medications  . atorvastatin (LIPITOR) 40 MG tablet    Sig: TAKE 1 TABLET BY MOUTH ONCE DAILY AT  6PM.    Dispense:  90 tablet    Refill:  1  . lisinopril-hydrochlorothiazide (ZESTORETIC) 20-12.5 MG tablet    Sig: TAKE 1 TABLET BY MOUTH ONCE DAILY     Dispense:  30 tablet    Refill:  0  . metFORMIN (GLUCOPHAGE) 500 MG tablet    Sig: TAKE 1 TABLET BY MOUTH TWICE DAILY WITH MEALS (NEEDS OFFICE VISIT FOR NEXT REFILL)    Dispense:  180 tablet    Refill:  1  . omeprazole (PRILOSEC) 20 MG capsule    Sig: Take 1 capsule (20 mg total) by mouth daily.    Dispense:  30 capsule    Refill:  0   Patient Instructions   I will check some blood work for the abdominal pain and I would like to see you again in 3 days.  If any worsening abdominal pain, fevers, or return of nausea or vomiting be seen right away through an urgent care or emergency room.   No med changes for today.    Abdominal Pain, Adult Pain in the abdomen (abdominal pain) can be caused by many things. Often, abdominal pain is not serious and it gets better with no treatment or by being treated at home. However, sometimes abdominal pain is serious. Your health care provider will ask questions about your medical history and do a physical exam to try to determine the cause of your abdominal pain. Follow these instructions at home: Medicines  Take over-the-counter and prescription medicines only as told by your health care provider.  Do not take a laxative unless told by your health care provider. General instructions  Watch your condition for any changes.  Drink enough fluid to keep your urine pale yellow.  Keep all follow-up visits as told by your health care provider. This is important.   Contact a health care provider if:  Your abdominal pain changes or gets worse.  You are not hungry or you lose weight without trying.  You are constipated or have diarrhea for more than 2-3 days.  You have pain when you urinate or have a bowel movement.  Your abdominal pain wakes you up at night.  Your pain gets worse with meals, after eating, or with certain foods.  You are vomiting and cannot keep anything down.  You have a fever.  You have blood in your urine. Get help right  away if:  Your pain does not go away as soon as your health care provider told you to expect.  You cannot stop vomiting.  Your pain is only in areas of the abdomen, such as the right side or the left lower portion of the abdomen. Pain on the right side could be caused by appendicitis.  You have bloody or black stools, or stools that look like tar.  You have severe pain, cramping, or bloating in your abdomen.  You have signs of dehydration, such as: ? Dark urine, very little urine, or no urine. ? Cracked lips. ? Dry mouth. ? Sunken eyes. ? Sleepiness. ? Weakness.  You have trouble breathing or chest pain. Summary  Often, abdominal pain is not serious and it gets better with no treatment or by being treated at home. However, sometimes abdominal pain is serious.  Watch your condition for any changes.  Take over-the-counter and prescription medicines only as told by your health care provider.  Contact a health care provider if your abdominal pain changes or gets worse.  Get help right away if you have severe pain, cramping, or bloating in your abdomen. This information is not intended to replace advice given to you by your health care provider. Make sure you discuss any questions you have with your health care provider. Document Revised: 08/26/2019 Document Reviewed: 11/15/2018 Elsevier Patient Education  2021 Elsevier Inc.      Signed, Meredith Staggers, MD [facility]

## 2020-12-06 ENCOUNTER — Ambulatory Visit (INDEPENDENT_AMBULATORY_CARE_PROVIDER_SITE_OTHER): Payer: Medicare HMO | Admitting: Family Medicine

## 2020-12-06 ENCOUNTER — Encounter: Payer: Self-pay | Admitting: Family Medicine

## 2020-12-06 ENCOUNTER — Other Ambulatory Visit: Payer: Self-pay

## 2020-12-06 VITALS — BP 140/76 | HR 85 | Temp 98.0°F | Resp 16 | Ht 70.0 in | Wt 207.0 lb

## 2020-12-06 DIAGNOSIS — D72829 Elevated white blood cell count, unspecified: Secondary | ICD-10-CM | POA: Diagnosis not present

## 2020-12-06 DIAGNOSIS — R1031 Right lower quadrant pain: Secondary | ICD-10-CM

## 2020-12-06 NOTE — Progress Notes (Signed)
Subjective:  Patient ID: Gabriel Wu, male    DOB: 08-24-1940  Age: 80 y.o. MRN: [id number]  CC:  Chief Complaint  Patient presents with  . Abdominal Pain    Pt reports pain this morning feels close to when he had a kidney stone about 30 years ago somewhat better than the other day    HPI [name] presents for   Abdominal Pain: Follow up form 3 days ago. Intermittent sx's. Feeling better past few days. No n/v/fever.  Feels like kidney stone 30 yrs ago. No hematuria/urgency/dysuria.  Some flank pain getting out of bed few days ago - not now.  Later in visit - notes pain is about the same as 3 days ago - not worse.    Lab Results  Component Value Date   WBC 13.1 (H) 12/03/2020   HGB 15.1 12/03/2020   HCT 44.2 12/03/2020   MCV 84.0 12/03/2020   PLT 244.0 12/03/2020     History Patient Active Problem List   Diagnosis Date Noted  . DM (diabetes mellitus) (HCC) 05/12/2016  . Hyperlipemia   . Chest pain 04/12/2016  . Essential hypertension 04/12/2016  . Dyspepsia 04/12/2016   Past Medical History:  Diagnosis Date  . Hypertension   . Urolithiasis    Past stone spontaneously per patient.   No past surgical history on file. No Known Allergies Prior to Admission medications   Medication Sig Start Date End Date Taking? Authorizing Provider  aspirin 81 MG EC tablet Take 1 tablet (81 mg total) by mouth daily. 08/29/19  Yes [name], MD  atorvastatin (LIPITOR) 40 MG tablet TAKE 1 TABLET BY MOUTH ONCE DAILY AT  6PM. 12/03/20  Yes [name], MD  lisinopril-hydrochlorothiazide (ZESTORETIC) 20-12.5 MG tablet TAKE 1 TABLET BY MOUTH ONCE DAILY 12/03/20  Yes [name], MD  metFORMIN (GLUCOPHAGE) 500 MG tablet TAKE 1 TABLET BY MOUTH TWICE DAILY WITH MEALS (NEEDS OFFICE VISIT FOR NEXT REFILL) 12/03/20  Yes [name], MD  omeprazole (PRILOSEC) 20 MG capsule Take 1 capsule (20 mg total) by mouth daily. 12/03/20  Yes [name], MD    Social History   Socioeconomic History  . Marital status: Single    Spouse name: Not on file  . Number of children: Not on file  . Years of education: Not on file  . Highest education level: Not on file  Occupational History  . Not on file  Tobacco Use  . Smoking status: Former Smoker    Years: 50.00    Types: Cigars    Quit date: 07/21/2010    Years since quitting: 10.3  . Smokeless tobacco: Never Used  Vaping Use  . Vaping Use: Never used  Substance and Sexual Activity  . Alcohol use: No  . Drug use: No  . Sexual activity: Never  Other Topics Concern  . Not on file  Social History Narrative  . Not on file   Social Determinants of Health   Financial Resource Strain: Not on file  Food Insecurity: Not on file  Transportation Needs: Not on file  Physical Activity: Not on file  Stress: Not on file  Social Connections: Not on file  Intimate Partner Violence: Not on file    Review of Systems   Objective:   Vitals:   12/06/20 1636  BP: 140/76  Pulse: 85  Resp: 16  Temp: 98 F (36.7 C)  TempSrc: Temporal  SpO2: 95%  Weight: 207 lb (93.9 kg)  Height:  5\' 10"  (1.778 m)     Physical Exam Vitals reviewed.  Constitutional:      General: He is not in acute distress.    Appearance: He is well-developed. He is not ill-appearing, toxic-appearing or diaphoretic.  HENT:     Head: Normocephalic and atraumatic.  Eyes:     Pupils: Pupils are equal, round, and reactive to light.  Neck:     Vascular: No carotid bruit or JVD.  Cardiovascular:     Rate and Rhythm: Normal rate and regular rhythm.     Heart sounds: Normal heart sounds. No murmur heard.   Pulmonary:     Effort: Pulmonary effort is normal.     Breath sounds: Normal breath sounds. No rales.  Abdominal:     Tenderness: There is abdominal tenderness (Right lower quadrant, McBurney's point, no rebound/guarding.).  Skin:    General: Skin is warm and dry.  Neurological:     Mental Status: He is alert  and oriented to person, place, and time.      Assessment & Plan:  Austen Oyster is a 80 y.o. male . Leukocytosis, unspecified type - Plan: CBC  Right lower quadrant abdominal pain - Plan: CT Abdomen Pelvis Wo Contrast, CBC Intermittent abdominal pain as above, initially had reported some improvement since last visit but then stable symptoms.  Slight leukocytosis but no fever.  Still slight discomfort right lower quadrant, will arrange for CT abdomen pelvis tomorrow with overnight ER precautions given if any acute or worsening symptoms.  Reports history of nephrolithiasis, but not typical symptoms.  Should also be seen on CT.  No orders of the defined types were placed in this encounter.  Patient Instructions   I will recheck your blood count tonight and check a CT scan of your abdomen tomorrow to look for infection.  That should also show a kidney stone and see if that is the cause of your symptoms.  If any worsening pain overnight, fevers, nausea or vomiting proceed to the emergency room.   Abdominal Pain, Adult Pain in the abdomen (abdominal pain) can be caused by many things. Often, abdominal pain is not serious and it gets better with no treatment or by being treated at home. However, sometimes abdominal pain is serious. Your health care provider will ask questions about your medical history and do a physical exam to try to determine the cause of your abdominal pain. Follow these instructions at home: Medicines  Take over-the-counter and prescription medicines only as told by your health care provider.  Do not take a laxative unless told by your health care provider. General instructions  Watch your condition for any changes.  Drink enough fluid to keep your urine pale yellow.  Keep all follow-up visits as told by your health care provider. This is important.   Contact a health care provider if:  Your abdominal pain changes or gets worse.  You are not hungry or you  lose weight without trying.  You are constipated or have diarrhea for more than 2-3 days.  You have pain when you urinate or have a bowel movement.  Your abdominal pain wakes you up at night.  Your pain gets worse with meals, after eating, or with certain foods.  You are vomiting and cannot keep anything down.  You have a fever.  You have blood in your urine. Get help right away if:  Your pain does not go away as soon as your health care provider told you to expect.  You  cannot stop vomiting.  Your pain is only in areas of the abdomen, such as the right side or the left lower portion of the abdomen. Pain on the right side could be caused by appendicitis.  You have bloody or black stools, or stools that look like tar.  You have severe pain, cramping, or bloating in your abdomen.  You have signs of dehydration, such as: ? Dark urine, very little urine, or no urine. ? Cracked lips. ? Dry mouth. ? Sunken eyes. ? Sleepiness. ? Weakness.  You have trouble breathing or chest pain. Summary  Often, abdominal pain is not serious and it gets better with no treatment or by being treated at home. However, sometimes abdominal pain is serious.  Watch your condition for any changes.  Take over-the-counter and prescription medicines only as told by your health care provider.  Contact a health care provider if your abdominal pain changes or gets worse.  Get help right away if you have severe pain, cramping, or bloating in your abdomen. This information is not intended to replace advice given to you by your health care provider. Make sure you discuss any questions you have with your health care provider. Document Revised: 08/26/2019 Document Reviewed: 11/15/2018 Elsevier Patient Education  2021 Elsevier Inc.      Signed, Meredith Staggers, MD [facility]

## 2020-12-06 NOTE — Patient Instructions (Signed)
I will recheck your blood count tonight and check a CT scan of your abdomen tomorrow to look for infection.  That should also show a kidney stone and see if that is the cause of your symptoms.  If any worsening pain overnight, fevers, nausea or vomiting proceed to the emergency room.   Abdominal Pain, Adult Pain in the abdomen (abdominal pain) can be caused by many things. Often, abdominal pain is not serious and it gets better with no treatment or by being treated at home. However, sometimes abdominal pain is serious. Your health care provider will ask questions about your medical history and do a physical exam to try to determine the cause of your abdominal pain. Follow these instructions at home: Medicines  Take over-the-counter and prescription medicines only as told by your health care provider.  Do not take a laxative unless told by your health care provider. General instructions  Watch your condition for any changes.  Drink enough fluid to keep your urine pale yellow.  Keep all follow-up visits as told by your health care provider. This is important.   Contact a health care provider if:  Your abdominal pain changes or gets worse.  You are not hungry or you lose weight without trying.  You are constipated or have diarrhea for more than 2-3 days.  You have pain when you urinate or have a bowel movement.  Your abdominal pain wakes you up at night.  Your pain gets worse with meals, after eating, or with certain foods.  You are vomiting and cannot keep anything down.  You have a fever.  You have blood in your urine. Get help right away if:  Your pain does not go away as soon as your health care provider told you to expect.  You cannot stop vomiting.  Your pain is only in areas of the abdomen, such as the right side or the left lower portion of the abdomen. Pain on the right side could be caused by appendicitis.  You have bloody or black stools, or stools that look like  tar.  You have severe pain, cramping, or bloating in your abdomen.  You have signs of dehydration, such as: ? Dark urine, very little urine, or no urine. ? Cracked lips. ? Dry mouth. ? Sunken eyes. ? Sleepiness. ? Weakness.  You have trouble breathing or chest pain. Summary  Often, abdominal pain is not serious and it gets better with no treatment or by being treated at home. However, sometimes abdominal pain is serious.  Watch your condition for any changes.  Take over-the-counter and prescription medicines only as told by your health care provider.  Contact a health care provider if your abdominal pain changes or gets worse.  Get help right away if you have severe pain, cramping, or bloating in your abdomen. This information is not intended to replace advice given to you by your health care provider. Make sure you discuss any questions you have with your health care provider. Document Revised: 08/26/2019 Document Reviewed: 11/15/2018 Elsevier Patient Education  2021 [employer].

## 2020-12-07 ENCOUNTER — Telehealth: Payer: Self-pay

## 2020-12-07 ENCOUNTER — Ambulatory Visit (HOSPITAL_COMMUNITY)
Admission: RE | Admit: 2020-12-07 | Discharge: 2020-12-07 | Disposition: A | Discharge status: Home / Self Care | Payer: Medicare HMO | Source: Ambulatory Visit | Attending: Family Medicine | Admitting: Family Medicine

## 2020-12-07 DIAGNOSIS — K753 Granulomatous hepatitis, not elsewhere classified: Secondary | ICD-10-CM | POA: Diagnosis not present

## 2020-12-07 DIAGNOSIS — K5730 Diverticulosis of large intestine without perforation or abscess without bleeding: Secondary | ICD-10-CM | POA: Diagnosis not present

## 2020-12-07 DIAGNOSIS — K7689 Other specified diseases of liver: Secondary | ICD-10-CM | POA: Diagnosis not present

## 2020-12-07 DIAGNOSIS — I700 Atherosclerosis of aorta: Secondary | ICD-10-CM | POA: Diagnosis not present

## 2020-12-07 DIAGNOSIS — R1031 Right lower quadrant pain: Secondary | ICD-10-CM | POA: Insufficient documentation

## 2020-12-07 LAB — CBC
HCT: 43.5 % (ref 39.0–52.0)
Hemoglobin: 14.8 g/dL (ref 13.0–17.0)
MCHC: 34.1 g/dL (ref 30.0–36.0)
MCV: 84.3 fl (ref 78.0–100.0)
Platelets: 242 10*3/uL (ref 150.0–400.0)
RBC: 5.16 Mil/uL (ref 4.22–5.81)
RDW: 14.6 % (ref 11.5–15.5)
WBC: 11.5 10*3/uL — ABNORMAL HIGH (ref 4.0–10.5)

## 2020-12-07 MED ORDER — IOHEXOL 9 MG/ML PO SOLN
ORAL | Status: AC
  Filled 2020-12-07: qty 1000

## 2020-12-07 NOTE — Addendum Note (Signed)
Addended by: Meredith Staggers R on: 12/07/2020 10:34 AM   Modules accepted: Orders

## 2020-12-07 NOTE — Telephone Encounter (Signed)
Stat CT results have been sent back.   Department has called requesting results to be reviewed.   I have placed a call to team health to page the on call provider to review.

## 2020-12-07 NOTE — Addendum Note (Signed)
Addended by: Meredith Staggers R on: 12/07/2020 10:12 AM   Modules accepted: Orders

## 2020-12-10 NOTE — Telephone Encounter (Signed)
Called pt with results. He is feeling better. Recommended follow up in next few weeks for repeat CBC to make sure trend to normal - understanding expressed.

## 2020-12-26 ENCOUNTER — Ambulatory Visit (INDEPENDENT_AMBULATORY_CARE_PROVIDER_SITE_OTHER): Payer: Medicare HMO | Admitting: Family Medicine

## 2020-12-26 ENCOUNTER — Telehealth: Payer: Self-pay

## 2020-12-26 VITALS — BP 138/76 | HR 100 | Temp 98.0°F | Resp 17 | Ht 70.0 in | Wt 203.6 lb

## 2020-12-26 DIAGNOSIS — R809 Proteinuria, unspecified: Secondary | ICD-10-CM

## 2020-12-26 DIAGNOSIS — R1031 Right lower quadrant pain: Secondary | ICD-10-CM

## 2020-12-26 DIAGNOSIS — E1129 Type 2 diabetes mellitus with other diabetic kidney complication: Secondary | ICD-10-CM

## 2020-12-26 DIAGNOSIS — D72829 Elevated white blood cell count, unspecified: Secondary | ICD-10-CM | POA: Diagnosis not present

## 2020-12-26 MED ORDER — METFORMIN HCL 850 MG PO TABS: 850.0000 mg | ORAL_TABLET | Freq: Two times a day (BID) | ORAL | 3 refills | Status: DC

## 2020-12-26 NOTE — Patient Instructions (Signed)
Try increasing metformin to 850 mg twice per day.  No other medication changes.  We can recheck your diabetes test in 3 months as well as your blood counts.  If any return of abdominal pain, that does not improve with MiraLAX, or any fevers, be seen right away.  Please let me know if there are questions  Return to the clinic or go to the nearest emergency room if any of your symptoms worsen or new symptoms occur.

## 2020-12-26 NOTE — Progress Notes (Signed)
Subjective:  Patient ID: Gabriel Wu, male    DOB: Feb 14, 1941  Age: 80 y.o. MRN: [id number]  CC:  Chief Complaint  Patient presents with   Abdominal Pain    Pt reports no new or worsening sxs, reports last 2 days has been pain free, no concerns     HPI Gabriel Wu presents for   Abdominal pain with leukocytosis Follow-up from May 19th, intermittent abdominal pain at that time.  CT abdomen on 12/07/2020.  Colonic diverticulosis without evidence of acute diverticulitis and no acute findings to account for his symptoms.  Appendix was normal. Leukocytosis had improved from 13.1 on May 16th-11.5 on May 19.  Last feeling of soreness last weekend when constipated. No fever. miralax helped last weekend helped. No pain in past few days and no pain since last visit. Pain free past few days.   Diabetes: With hyperglycemia, micoalbuminuria.  Taking metfomin 500mg  BID - no missed doses.  On ace-I and statin.  Home readings around 140. No lows.  Lab Results  Component Value Date   HGBA1C 8.1 (H) 12/03/2020   HGBA1C 7.2 (A) 05/14/2020   HGBA1C 6.4 (A) 09/07/2018   Lab Results  Component Value Date   MICROALBUR 5.2 (H) 12/03/2020   LDLCALC 32 12/03/2020   CREATININE 1.38 12/03/2020      History Patient Active Problem List   Diagnosis Date Noted   DM (diabetes mellitus) (HCC) 05/12/2016   Hyperlipemia    Chest pain 04/12/2016   Essential hypertension 04/12/2016   Dyspepsia 04/12/2016   Past Medical History:  Diagnosis Date   Hypertension    Urolithiasis    Past stone spontaneously per patient.   No past surgical history on file. No Known Allergies Prior to Admission medications   Medication Sig Start Date End Date Taking? Authorizing Provider  aspirin 81 MG EC tablet Take 1 tablet (81 mg total) by mouth daily. 08/29/19  Yes 10/27/19, MD  atorvastatin (LIPITOR) 40 MG tablet TAKE 1 TABLET BY MOUTH ONCE DAILY AT  6PM. 12/03/20  Yes 12/05/20, MD   lisinopril-hydrochlorothiazide (ZESTORETIC) 20-12.5 MG tablet TAKE 1 TABLET BY MOUTH ONCE DAILY 12/03/20  Yes 12/05/20, MD  metFORMIN (GLUCOPHAGE) 500 MG tablet TAKE 1 TABLET BY MOUTH TWICE DAILY WITH MEALS (NEEDS OFFICE VISIT FOR NEXT REFILL) 12/03/20  Yes 12/05/20, MD  omeprazole (PRILOSEC) 20 MG capsule Take 1 capsule (20 mg total) by mouth daily. 12/03/20  Yes 12/05/20, MD   Social History   Socioeconomic History   Marital status: Single    Spouse name: Not on file   Number of children: Not on file   Years of education: Not on file   Highest education level: Not on file  Occupational History   Not on file  Tobacco Use   Smoking status: Former    Pack years: 0.00    Types: Cigars    Quit date: 07/21/2010    Years since quitting: 10.4   Smokeless tobacco: Never  Vaping Use   Vaping Use: Never used  Substance and Sexual Activity   Alcohol use: No   Drug use: No   Sexual activity: Never  Other Topics Concern   Not on file  Social History Narrative   Not on file   Social Determinants of Health   Financial Resource Strain: Not on file  Food Insecurity: Not on file  Transportation Needs: Not on file  Physical Activity: Not on file  Stress: Not  on file  Social Connections: Not on file  Intimate Partner Violence: Not on file    Review of Systems Per [facility]Pi  Objective:   Vitals:   12/26/20 1557  BP: 138/76  Pulse: 100  Resp: 17  Temp: 98 F (36.7 C)  TempSrc: Temporal  SpO2: 95%  Weight: 203 lb 9.6 oz (92.4 kg)  Height: 5\' 10"  (1.778 m)     Physical Exam Vitals reviewed.  Constitutional:      Appearance: He is well-developed.  HENT:     Head: Normocephalic and atraumatic.  Neck:     Vascular: No carotid bruit or JVD.  Cardiovascular:     Rate and Rhythm: Normal rate and regular rhythm.     Heart sounds: Normal heart sounds. No murmur heard. Pulmonary:     Effort: Pulmonary effort is normal.     Breath sounds: Normal breath  sounds. No rales.  Musculoskeletal:     Right lower leg: No edema.     Left lower leg: No edema.  Skin:    General: Skin is warm and dry.  Neurological:     Mental Status: He is alert and oriented to person, place, and time.  Psychiatric:        Mood and Affect: Mood normal.     Assessment & Plan:  Gabriel Wu is a 80 y.o. male . Right lower quadrant abdominal pain Leukocytosis, unspecified type  -Symptomatic and appeared to be more related to constipation.  Bowel regimen discussed.  Continue MiraLAX, RTC precautions if any fever, new or persistent abdominal pain.  Type 2 diabetes mellitus with microalbuminuria, without long-term current use of insulin (HCC)  -Decreased control, will initially increase metformin to 850 mg twice daily with recheck A1c in 3 months.  Continue to watch diet, low intensity activity.  Meds ordered this encounter  Medications   metFORMIN (GLUCOPHAGE) 850 MG tablet    Sig: Take 1 tablet (850 mg total) by mouth 2 (two) times daily with a meal.    Dispense:  60 tablet    Refill:  3    Change in dose.   Patient Instructions  Try increasing metformin to 850 mg twice per day.  No other medication changes.  We can recheck your diabetes test in 3 months as well as your blood counts.  If any return of abdominal pain, that does not improve with MiraLAX, or any fevers, be seen right away.  Please let me know if there are questions  Return to the clinic or go to the nearest emergency room if any of your symptoms worsen or new symptoms occur.    Signed,  76, MD [facility], [facility] 12/28/20 2:06 PM

## 2020-12-26 NOTE — Telephone Encounter (Signed)
error 

## 2020-12-28 ENCOUNTER — Encounter: Payer: Self-pay | Admitting: Family Medicine

## 2021-01-15 ENCOUNTER — Other Ambulatory Visit: Payer: Self-pay | Admitting: Family Medicine

## 2021-01-15 DIAGNOSIS — K219 Gastro-esophageal reflux disease without esophagitis: Secondary | ICD-10-CM

## 2021-01-15 DIAGNOSIS — I10 Essential (primary) hypertension: Secondary | ICD-10-CM

## 2021-02-13 ENCOUNTER — Other Ambulatory Visit: Payer: Self-pay | Admitting: Family Medicine

## 2021-02-13 DIAGNOSIS — K219 Gastro-esophageal reflux disease without esophagitis: Secondary | ICD-10-CM

## 2021-02-13 DIAGNOSIS — I10 Essential (primary) hypertension: Secondary | ICD-10-CM

## 2021-02-28 ENCOUNTER — Encounter: Payer: Self-pay | Admitting: Family Medicine

## 2021-02-28 ENCOUNTER — Other Ambulatory Visit: Payer: Self-pay

## 2021-02-28 ENCOUNTER — Ambulatory Visit (INDEPENDENT_AMBULATORY_CARE_PROVIDER_SITE_OTHER): Payer: Medicare HMO | Admitting: Family Medicine

## 2021-02-28 VITALS — BP 128/78 | HR 97 | Temp 98.1°F | Resp 17 | Ht 70.0 in | Wt 203.8 lb

## 2021-02-28 DIAGNOSIS — K219 Gastro-esophageal reflux disease without esophagitis: Secondary | ICD-10-CM | POA: Diagnosis not present

## 2021-02-28 DIAGNOSIS — R809 Proteinuria, unspecified: Secondary | ICD-10-CM | POA: Diagnosis not present

## 2021-02-28 DIAGNOSIS — I10 Essential (primary) hypertension: Secondary | ICD-10-CM | POA: Diagnosis not present

## 2021-02-28 DIAGNOSIS — E785 Hyperlipidemia, unspecified: Secondary | ICD-10-CM | POA: Diagnosis not present

## 2021-02-28 DIAGNOSIS — E1129 Type 2 diabetes mellitus with other diabetic kidney complication: Secondary | ICD-10-CM

## 2021-02-28 MED ORDER — METFORMIN HCL 850 MG PO TABS: 850.0000 mg | ORAL_TABLET | Freq: Two times a day (BID) | ORAL | 1 refills | Status: DC

## 2021-02-28 MED ORDER — OMEPRAZOLE 20 MG PO CPDR: 20.0000 mg | DELAYED_RELEASE_CAPSULE | Freq: Every day | ORAL | 2 refills | Status: DC

## 2021-02-28 MED ORDER — ATORVASTATIN CALCIUM 40 MG PO TABS: ORAL_TABLET | ORAL | 1 refills | Status: DC

## 2021-02-28 MED ORDER — LISINOPRIL-HYDROCHLOROTHIAZIDE 20-12.5 MG PO TABS
1.0000 | ORAL_TABLET | Freq: Every day | ORAL | 1 refills | Status: DC
Start: 2021-02-28 — End: 2021-09-17

## 2021-02-28 NOTE — Patient Instructions (Addendum)
If any return of back pain, return for recheck. Tylenol is ok if needed on occasion.   No med changes today.  Return in 2 weeks for lab visit only, recheck with me in 4 months.

## 2021-02-28 NOTE — Progress Notes (Signed)
Subjective:  Patient ID: Gabriel Wu, male    DOB: [birth year]  Age: 79 y.o. MRN: [id number]  CC:  Chief Complaint  Patient presents with   Diabetes    Pt here for diabetes today no concerns pt due for 3 month check     HPI Gabriel Wu presents for  Diabetes: With microalbuminuria, hyperglycemia, last A1c elevated in May.  Treated with metformin 500 mg twice daily, increased to 850 mg twice daily in June. No new side effects at higher dose.  On statin and ACE-I. No home readings or symtpoms of symptomatic lows.   Microalbumin: slight elevation of 5.2 on 12/03/20 Optho, foot exam, pneumovax:  Optho - recommended, he plans to reschedule.  Declined covid vaccine, denies questions. Declines shingles vaccine.   Lab Results  Component Value Date   HGBA1C 8.1 (H) 12/03/2020   HGBA1C 7.2 (A) 05/14/2020   HGBA1C 6.4 (A) 09/07/2018   Lab Results  Component Value Date   MICROALBUR 5.2 (H) 12/03/2020   LDLCALC 32 12/03/2020   CREATININE 1.38 12/03/2020   Hypertension: Lisinopril hct daily. No new side effects.  Home readings: 128/84.  Some back pain for few days after last visit - resolved. No current  BP Readings from Last 3 Encounters:  02/28/21 128/78  12/26/20 138/76  12/06/20 140/76   Lab Results  Component Value Date   CREATININE 1.38 12/03/2020   GERD: Controlled with daily dosing.    History Patient Active Problem List   Diagnosis Date Noted   DM (diabetes mellitus) (HCC) 05/12/2016   Hyperlipemia    Chest pain 04/12/2016   Essential hypertension 04/12/2016   Dyspepsia 04/12/2016   Past Medical History:  Diagnosis Date   Hypertension    Urolithiasis    Past stone spontaneously per patient.   No past surgical history on file. No Known Allergies Prior to Admission medications   Medication Sig Start Date End Date Taking? Authorizing Provider  aspirin 81 MG EC tablet Take 1 tablet (81 mg total) by mouth daily. 08/29/19  Yes [name], MD  atorvastatin (LIPITOR) 40 MG tablet TAKE 1 TABLET BY MOUTH ONCE DAILY AT  6PM. 12/03/20  Yes [name], MD  lisinopril-hydrochlorothiazide (ZESTORETIC) 20-12.5 MG tablet Take 1 tablet by mouth once daily 02/13/21  Yes [name], MD  metFORMIN (GLUCOPHAGE) 850 MG tablet Take 1 tablet (850 mg total) by mouth 2 (two) times daily with a meal. 12/26/20  Yes [name], MD  omeprazole (PRILOSEC) 20 MG capsule Take 1 capsule by mouth once daily 02/13/21  Yes [name], MD   Social History   Socioeconomic History   Marital status: Single    Spouse name: Not on file   Number of children: Not on file   Years of education: Not on file   Highest education level: Not on file  Occupational History   Not on file  Tobacco Use   Smoking status: Former    Types: Cigars    Quit date: 07/21/2010    Years since quitting: 10.6   Smokeless tobacco: Never  Vaping Use   Vaping Use: Never used  Substance and Sexual Activity   Alcohol use: No   Drug use: No   Sexual activity: Never  Other Topics Concern   Not on file  Social History Narrative   Not on file   Social Determinants of Health   Financial Resource Strain: Not on file  Food Insecurity: Not on file  Transportation  Needs: Not on file  Physical Activity: Not on file  Stress: Not on file  Social Connections: Not on file  Intimate Partner Violence: Not on file    Review of Systems  Constitutional:  Negative for fatigue and unexpected weight change.  Eyes:  Negative for visual disturbance.  Respiratory:  Negative for cough, chest tightness and shortness of breath.   Cardiovascular:  Negative for chest pain, palpitations and leg swelling.  Gastrointestinal:  Negative for abdominal pain and blood in stool.  Neurological:  Negative for dizziness, light-headedness and headaches.    Objective:   Vitals:   02/28/21 1541  BP: 128/78  Pulse: 97  Resp: 17  Temp: 98.1 F (36.7 C)  TempSrc: Temporal  SpO2:  96%  Weight: 203 lb 12.8 oz (92.4 kg)  Height: 5\' 10"  (1.778 m)     Physical Exam Vitals reviewed.  Constitutional:      Appearance: He is well-developed.  HENT:     Head: Normocephalic and atraumatic.  Neck:     Vascular: No carotid bruit or JVD.  Cardiovascular:     Rate and Rhythm: Normal rate and regular rhythm.     Heart sounds: Normal heart sounds. No murmur heard. Pulmonary:     Effort: Pulmonary effort is normal.     Breath sounds: Normal breath sounds. No rales.  Musculoskeletal:     Right lower leg: No edema.     Left lower leg: No edema.  Skin:    General: Skin is warm and dry.  Neurological:     Mental Status: He is alert and oriented to person, place, and time.  Psychiatric:        Mood and Affect: Mood normal.       Assessment & Plan:  Gabriel Wu is a 80 y.o. male . Type 2 diabetes mellitus with microalbuminuria, without long-term current use of insulin (HCC) - Plan: Hemoglobin A1c, metFORMIN (GLUCOPHAGE) 850 MG tablet  -Tolerating higher dose metformin, check A1c at lab visit next few weeks.  Essential hypertension - Plan: Basic metabolic panel, lisinopril-hydrochlorothiazide (ZESTORETIC) 20-12.5 MG tablet  -Stable control on current regimen and by recent home readings.  Continue same.  Gastroesophageal reflux disease, unspecified whether esophagitis present - Plan: omeprazole (PRILOSEC) 20 MG capsule  -Stable with daily use, continue same  Hyperlipidemia, unspecified hyperlipidemia type - Plan: atorvastatin (LIPITOR) 40 MG tablet  -Tolerating statin, repeat lipids in the next few months.  No changes for now.  Meds ordered this encounter  Medications   lisinopril-hydrochlorothiazide (ZESTORETIC) 20-12.5 MG tablet    Sig: Take 1 tablet by mouth daily.    Dispense:  90 tablet    Refill:  1   omeprazole (PRILOSEC) 20 MG capsule    Sig: Take 1 capsule (20 mg total) by mouth daily.    Dispense:  90 capsule    Refill:  2   atorvastatin  (LIPITOR) 40 MG tablet    Sig: TAKE 1 TABLET BY MOUTH ONCE DAILY AT  6PM.    Dispense:  90 tablet    Refill:  1   metFORMIN (GLUCOPHAGE) 850 MG tablet    Sig: Take 1 tablet (850 mg total) by mouth 2 (two) times daily with a meal.    Dispense:  180 tablet    Refill:  1    Change in dose.   Patient Instructions  If any return of back pain, return for recheck. Tylenol is ok if needed on occasion.   No med changes today.  Return in 2 weeks for lab visit only, recheck with me in 4 months.     Signed,   Meredith Staggers, MD [facility], [facility] 02/28/21 5:44 PM

## 2021-03-14 ENCOUNTER — Other Ambulatory Visit (INDEPENDENT_AMBULATORY_CARE_PROVIDER_SITE_OTHER): Payer: Medicare HMO

## 2021-03-14 ENCOUNTER — Other Ambulatory Visit: Payer: Self-pay

## 2021-03-14 DIAGNOSIS — E1129 Type 2 diabetes mellitus with other diabetic kidney complication: Secondary | ICD-10-CM | POA: Diagnosis not present

## 2021-03-14 DIAGNOSIS — I10 Essential (primary) hypertension: Secondary | ICD-10-CM | POA: Diagnosis not present

## 2021-03-14 DIAGNOSIS — R809 Proteinuria, unspecified: Secondary | ICD-10-CM | POA: Diagnosis not present

## 2021-03-14 LAB — BASIC METABOLIC PANEL
BUN: 21 mg/dL (ref 6–23)
CO2: 27 mEq/L (ref 19–32)
Calcium: 10.2 mg/dL (ref 8.4–10.5)
Chloride: 100 mEq/L (ref 96–112)
Creatinine, Ser: 1.3 mg/dL (ref 0.40–1.50)
GFR: 52.16 mL/min — ABNORMAL LOW (ref >60.00)
Glucose, Bld: 159 mg/dL — ABNORMAL HIGH (ref 70–99)
Potassium: 4 mEq/L (ref 3.5–5.1)
Sodium: 136 mEq/L (ref 135–145)

## 2021-03-14 LAB — HEMOGLOBIN A1C: Hgb A1c MFr Bld: 7.8 % — ABNORMAL HIGH (ref 4.6–6.5)

## 2021-07-01 ENCOUNTER — Ambulatory Visit (INDEPENDENT_AMBULATORY_CARE_PROVIDER_SITE_OTHER): Payer: Medicare HMO | Admitting: Family Medicine

## 2021-07-01 VITALS — BP 130/78 | HR 95 | Temp 98.2°F | Resp 17 | Ht 70.0 in | Wt 206.2 lb

## 2021-07-01 DIAGNOSIS — R809 Proteinuria, unspecified: Secondary | ICD-10-CM | POA: Diagnosis not present

## 2021-07-01 DIAGNOSIS — E785 Hyperlipidemia, unspecified: Secondary | ICD-10-CM

## 2021-07-01 DIAGNOSIS — E1129 Type 2 diabetes mellitus with other diabetic kidney complication: Secondary | ICD-10-CM | POA: Diagnosis not present

## 2021-07-01 DIAGNOSIS — I10 Essential (primary) hypertension: Secondary | ICD-10-CM | POA: Diagnosis not present

## 2021-07-01 NOTE — Progress Notes (Signed)
Subjective:  Patient ID: Gabriel Wu, male    DOB: 06/18/1941  Age: 80 y.o. MRN: [id number]  CC:  Chief Complaint  Patient presents with   Hypertension    Pt denies physical sxs, no concerns at this time    Hyperlipidemia    Due for recheck    Diabetes    Due fo recheck no concerns     HPI Gabriel Wu presents for   Hypertension: Lisinopirl hct 20/12.5mg  qd.  Home readings:135-136, lowest 114, not feeling bad at this level. If leaning forward, may feel like continuing to go forward, but no falls.  No chest pain, dyspnea.  Last ate 5 hrs ago.  Feels well.  BP Readings from Last 3 Encounters:  07/01/21 130/78  02/28/21 128/78  12/26/20 138/76   Lab Results  Component Value Date   CREATININE 1.30 03/14/2021    Diabetes: With microalbuminuria.  Metformin 850mg  BID, no new side effects.  No home readings.  On ACE-I, and statin.  Microalbumin: 5.2 in May.  Optho, foot exam, pneumovax:  Had [facility] past few months at [facility].   Lab Results  Component Value Date   HGBA1C 7.8 (H) 03/14/2021   HGBA1C 8.1 (H) 12/03/2020   HGBA1C 7.2 (A) 05/14/2020   Lab Results  Component Value Date   MICROALBUR 5.2 (H) 12/03/2020   LDLCALC 32 12/03/2020   CREATININE 1.30 03/14/2021    Hyperlipidemia: Lipitor 40mg  qd - no new side effects.  Lab Results  Component Value Date   CHOL 110 12/03/2020   HDL 41.80 12/03/2020   LDLCALC 32 12/03/2020   TRIG 179.0 (H) 12/03/2020   CHOLHDL 3 12/03/2020   Lab Results  Component Value Date   ALT 21 12/03/2020   AST 19 12/03/2020   ALKPHOS 75 12/03/2020   BILITOT 0.5 12/03/2020    History Patient Active Problem List   Diagnosis Date Noted   DM (diabetes mellitus) ([place]) 05/12/2016   Hyperlipemia    Chest pain 04/12/2016   Essential hypertension 04/12/2016   Dyspepsia 04/12/2016   Past Medical History:  Diagnosis Date   Hypertension    Urolithiasis    Past stone spontaneously per patient.   No past  surgical history on file. No Known Allergies Prior to Admission medications   Medication Sig Start Date End Date Taking? Authorizing Provider  aspirin 81 MG EC tablet Take 1 tablet (81 mg total) by mouth daily. 08/29/19  Yes Wendie Agreste, MD  atorvastatin (LIPITOR) 40 MG tablet TAKE 1 TABLET BY MOUTH ONCE DAILY AT  6PM. 02/28/21  Yes Wendie Agreste, MD  lisinopril-hydrochlorothiazide (ZESTORETIC) 20-12.5 MG tablet Take 1 tablet by mouth daily. 02/28/21  Yes Wendie Agreste, MD  metFORMIN (GLUCOPHAGE) 850 MG tablet Take 1 tablet (850 mg total) by mouth 2 (two) times daily with a meal. 02/28/21  Yes Wendie Agreste, MD  omeprazole (PRILOSEC) 20 MG capsule Take 1 capsule (20 mg total) by mouth daily. 02/28/21  Yes Wendie Agreste, MD   Social History   Socioeconomic History   Marital status: Single    Spouse name: Not on file   Number of children: Not on file   Years of education: Not on file   Highest education level: Not on file  Occupational History   Not on file  Tobacco Use   Smoking status: Former    Types: Cigars    Quit date: 07/21/2010    Years since quitting: 10.9   Smokeless tobacco: Never  Vaping Use   Vaping Use: Never used  Substance and Sexual Activity   Alcohol use: No   Drug use: No   Sexual activity: Never  Other Topics Concern   Not on file  Social History Narrative   Not on file   Social Determinants of Health   Financial Resource Strain: Not on file  Food Insecurity: Not on file  Transportation Needs: Not on file  Physical Activity: Not on file  Stress: Not on file  Social Connections: Not on file  Intimate Partner Violence: Not on file    Review of Systems  Constitutional:  Negative for fatigue and unexpected weight change.  Eyes:  Negative for visual disturbance.  Respiratory:  Negative for cough, chest tightness and shortness of breath.   Cardiovascular:  Negative for chest pain, palpitations and leg swelling.  Gastrointestinal:   Negative for abdominal pain and blood in stool.  Neurological:  Negative for dizziness, light-headedness and headaches.    Objective:   Vitals:   07/01/21 1546  BP: 130/78  Pulse: 95  Resp: 17  Temp: 98.2 F (36.8 C)  TempSrc: Temporal  SpO2: 96%  Weight: 206 lb 3.2 oz (93.5 kg)  Height: 5\' 10"  (1.778 m)     Physical Exam Vitals reviewed.  Constitutional:      Appearance: He is well-developed.  HENT:     Head: Normocephalic and atraumatic.  Neck:     Vascular: No carotid bruit or JVD.  Cardiovascular:     Rate and Rhythm: Normal rate and regular rhythm.     Heart sounds: Normal heart sounds. No murmur heard. Pulmonary:     Effort: Pulmonary effort is normal.     Breath sounds: Normal breath sounds. No rales.  Musculoskeletal:     Right lower leg: No edema.     Left lower leg: No edema.  Skin:    General: Skin is warm and dry.  Neurological:     Mental Status: He is alert and oriented to person, place, and time.  Psychiatric:        Mood and Affect: Mood normal.       Assessment & Plan:  Gabriel Wu is a 80 y.o. male . Type 2 diabetes mellitus with microalbuminuria, without long-term current use of insulin (HCC) - Plan: Hemoglobin A1c Check A1c, tolerating current regimen, continue metformin same dose.  Essential hypertension - Plan: Comprehensive metabolic panel  -Stable in office.  Orthostatic precautions given at home and recommended squatting down instead of leaning forward/bending forward if that is causing some dizziness.  RTC precautions if that persists.  Maintenance of hydration also discussed.  Hyperlipidemia, unspecified hyperlipidemia type - Plan: Comprehensive metabolic panel, Lipid panel  -Tolerating Lipitor, check updated labs.    108-month follow-up for diabetes.  No orders of the defined types were placed in this encounter.  There are no Patient Instructions on file for this visit.    Signed,   2-month, MD [facility], [facility] 07/01/21 6:06 PM

## 2021-07-02 LAB — HEMOGLOBIN A1C: Hgb A1c MFr Bld: 7.8 % — ABNORMAL HIGH (ref 4.6–6.5)

## 2021-07-02 NOTE — Addendum Note (Signed)
Addended by: Eldred Manges on: 07/02/2021 08:04 AM   Modules accepted: Orders

## 2021-07-08 ENCOUNTER — Other Ambulatory Visit (INDEPENDENT_AMBULATORY_CARE_PROVIDER_SITE_OTHER): Payer: Medicare HMO

## 2021-07-08 DIAGNOSIS — E785 Hyperlipidemia, unspecified: Secondary | ICD-10-CM

## 2021-07-08 DIAGNOSIS — I10 Essential (primary) hypertension: Secondary | ICD-10-CM

## 2021-07-09 LAB — COMPREHENSIVE METABOLIC PANEL
ALT: 12 U/L (ref 0–53)
AST: 14 U/L (ref 0–37)
Albumin: 4.4 g/dL (ref 3.5–5.2)
Alkaline Phosphatase: 67 U/L (ref 39–117)
BUN: 21 mg/dL (ref 6–23)
CO2: 26 mEq/L (ref 19–32)
Calcium: 10 mg/dL (ref 8.4–10.5)
Chloride: 98 mEq/L (ref 96–112)
Creatinine, Ser: 1.37 mg/dL (ref 0.40–1.50)
GFR: 48.87 mL/min — ABNORMAL LOW (ref >60.00)
Glucose, Bld: 199 mg/dL — ABNORMAL HIGH (ref 70–99)
Potassium: 4.1 mEq/L (ref 3.5–5.1)
Sodium: 136 mEq/L (ref 135–145)
Total Bilirubin: 0.4 mg/dL (ref 0.2–1.2)
Total Protein: 7.5 g/dL (ref 6.0–8.3)

## 2021-07-09 LAB — LIPID PANEL
Cholesterol: 108 mg/dL (ref 0–200)
HDL: 38.7 mg/dL — ABNORMAL LOW (ref >39.00)
NonHDL: 69.47
Total CHOL/HDL Ratio: 3
Triglycerides: 216 mg/dL — ABNORMAL HIGH (ref 0.0–149.0)
VLDL: 43.2 mg/dL — ABNORMAL HIGH (ref 0.0–40.0)

## 2021-07-09 LAB — LDL CHOLESTEROL, DIRECT: Direct LDL: 48 mg/dL

## 2021-07-15 ENCOUNTER — Other Ambulatory Visit: Payer: Self-pay | Admitting: Family Medicine

## 2021-07-15 MED ORDER — METFORMIN HCL 1000 MG PO TABS: 1000.0000 mg | ORAL_TABLET | Freq: Two times a day (BID) | ORAL | 1 refills | Status: DC

## 2021-07-17 ENCOUNTER — Telehealth: Payer: Self-pay

## 2021-07-17 NOTE — Telephone Encounter (Signed)
Patient aware of labs and aware that the metformin had been sent to the pharmacy

## 2021-07-17 NOTE — Telephone Encounter (Signed)
-----   Message from [name], MD sent at 07/15/2021 12:58 PM EST ----- Call patient  Diabetes test slightly elevated.  I would like to try a slightly higher dose of metformin at 1000 mg twice per day.  I will send that to the pharmacy but if any new side effects let me know.  Let me know if there are questions

## 2021-08-29 ENCOUNTER — Ambulatory Visit (INDEPENDENT_AMBULATORY_CARE_PROVIDER_SITE_OTHER): Payer: Medicare HMO

## 2021-08-29 VITALS — Wt 206.0 lb

## 2021-08-29 DIAGNOSIS — Z0000 Encounter for general adult medical examination without abnormal findings: Secondary | ICD-10-CM

## 2021-08-29 NOTE — Patient Instructions (Signed)
Gabriel Wu , Thank you for taking time to come for your Medicare Wellness Visit. I appreciate your ongoing commitment to your health goals. Please review the following plan we discussed and let me know if I can assist you in the future.   Screening recommendations/referrals: Colonoscopy: No longer required Recommended yearly ophthalmology/optometry visit for glaucoma screening and checkup Recommended yearly dental visit for hygiene and checkup  Vaccinations: Influenza vaccine: Done 05/17/2021 - Repeat annually Pneumococcal vaccine: Done 04/13/2016 & 09/10/2017 Tdap vaccine: Due - recommended every 10 years Shingles vaccine: Due - recommend  2 doses 2-6 months apart - 90% effective Covid-19: Declined  Advanced directives: Advance directive discussed with you today. Even though you declined this today, please call our office should you change your mind, and we can give you the proper paperwork for you to fill out.   Conditions/risks identified: Aim for 30 minutes of exercise or brisk walking each day, drink 6-8 glasses of water and eat lots of fruits and vegetables.   Next appointment: Follow up in one year for your annual wellness visit.   Preventive Care 11 Years and Older, Male  Preventive care refers to lifestyle choices and visits with your health care provider that can promote health and wellness. What does preventive care include? A yearly physical exam. This is also called an annual well check. Dental exams once or twice a year. Routine eye exams. Ask your health care provider how often you should have your eyes checked. Personal lifestyle choices, including: Daily care of your teeth and gums. Regular physical activity. Eating a healthy diet. Avoiding tobacco and drug use. Limiting alcohol use. Practicing safe sex. Taking low doses of aspirin every day. Taking vitamin and mineral supplements as recommended by your health care provider. What happens during an annual well  check? The services and screenings done by your health care provider during your annual well check will depend on your age, overall health, lifestyle risk factors, and family history of disease. Counseling  Your health care provider may ask you questions about your: Alcohol use. Tobacco use. Drug use. Emotional well-being. Home and relationship well-being. Sexual activity. Eating habits. History of falls. Memory and ability to understand (cognition). Work and work Astronomer. Screening  You may have the following tests or measurements: Height, weight, and BMI. Blood pressure. Lipid and cholesterol levels. These may be checked every 5 years, or more frequently if you are over 83 years old. Skin check. Lung cancer screening. You may have this screening every year starting at age 22 if you have a 30-pack-year history of smoking and currently smoke or have quit within the past 15 years. Fecal occult blood test (FOBT) of the stool. You may have this test every year starting at age 43. Flexible sigmoidoscopy or colonoscopy. You may have a sigmoidoscopy every 5 years or a colonoscopy every 10 years starting at age 26. Prostate cancer screening. Recommendations will vary depending on your family history and other risks. Hepatitis C blood test. Hepatitis B blood test. Sexually transmitted disease (STD) testing. Diabetes screening. This is done by checking your blood sugar (glucose) after you have not eaten for a while (fasting). You may have this done every 1-3 years. Abdominal aortic aneurysm (AAA) screening. You may need this if you are a current or former smoker. Osteoporosis. You may be screened starting at age 47 if you are at high risk. Talk with your health care provider about your test results, treatment options, and if necessary, the need for more  tests. Vaccines  Your health care provider may recommend certain vaccines, such as: Influenza vaccine. This is recommended every  year. Tetanus, diphtheria, and acellular pertussis (Tdap, Td) vaccine. You may need a Td booster every 10 years. Zoster vaccine. You may need this after age 45. Pneumococcal 13-valent conjugate (PCV13) vaccine. One dose is recommended after age 67. Pneumococcal polysaccharide (PPSV23) vaccine. One dose is recommended after age 9. Talk to your health care provider about which screenings and vaccines you need and how often you need them. This information is not intended to replace advice given to you by your health care provider. Make sure you discuss any questions you have with your health care provider. Document Released: 08/03/2015 Document Revised: 03/26/2016 Document Reviewed: 05/08/2015 Elsevier Interactive Patient Education  2017 [employer] Prevention in the Home Falls can cause injuries. They can happen to people of all ages. There are many things you can do to make your home safe and to help prevent falls. What can I do on the outside of my home? Regularly fix the edges of walkways and driveways and fix any cracks. Remove anything that might make you trip as you walk through a door, such as a raised step or threshold. Trim any bushes or trees on the path to your home. Use bright outdoor lighting. Clear any walking paths of anything that might make someone trip, such as rocks or tools. Regularly check to see if handrails are loose or broken. Make sure that both sides of any steps have handrails. Any raised decks and porches should have guardrails on the edges. Have any leaves, snow, or ice cleared regularly. Use sand or salt on walking paths during winter. Clean up any spills in your garage right away. This includes oil or grease spills. What can I do in the bathroom? Use night lights. Install grab bars by the toilet and in the tub and shower. Do not use towel bars as grab bars. Use non-skid mats or decals in the tub or shower. If you need to sit down in the shower, use a  plastic, non-slip stool. Keep the floor dry. Clean up any water that spills on the floor as soon as it happens. Remove soap buildup in the tub or shower regularly. Attach bath mats securely with double-sided non-slip rug tape. Do not have throw rugs and other things on the floor that can make you trip. What can I do in the bedroom? Use night lights. Make sure that you have a light by your bed that is easy to reach. Do not use any sheets or blankets that are too big for your bed. They should not hang down onto the floor. Have a firm chair that has side arms. You can use this for support while you get dressed. Do not have throw rugs and other things on the floor that can make you trip. What can I do in the kitchen? Clean up any spills right away. Avoid walking on wet floors. Keep items that you use a lot in easy-to-reach places. If you need to reach something above you, use a strong step stool that has a grab bar. Keep electrical cords out of the way. Do not use floor polish or wax that makes floors slippery. If you must use wax, use non-skid floor wax. Do not have throw rugs and other things on the floor that can make you trip. What can I do with my stairs? Do not leave any items on the stairs. Make sure  that there are handrails on both sides of the stairs and use them. Fix handrails that are broken or loose. Make sure that handrails are as long as the stairways. Check any carpeting to make sure that it is firmly attached to the stairs. Fix any carpet that is loose or worn. Avoid having throw rugs at the top or bottom of the stairs. If you do have throw rugs, attach them to the floor with carpet tape. Make sure that you have a light switch at the top of the stairs and the bottom of the stairs. If you do not have them, ask someone to add them for you. What else can I do to help prevent falls? Wear shoes that: Do not have high heels. Have rubber bottoms. Are comfortable and fit you  well. Are closed at the toe. Do not wear sandals. If you use a stepladder: Make sure that it is fully opened. Do not climb a closed stepladder. Make sure that both sides of the stepladder are locked into place. Ask someone to hold it for you, if possible. Clearly [place] and make sure that you can see: Any grab bars or handrails. First and last steps. Where the edge of each step is. Use tools that help you move around (mobility aids) if they are needed. These include: Canes. Walkers. Scooters. Crutches. Turn on the lights when you go into a dark area. Replace any light bulbs as soon as they burn out. Set up your furniture so you have a clear path. Avoid moving your furniture around. If any of your floors are uneven, fix them. If there are any pets around you, be aware of where they are. Review your medicines with your doctor. Some medicines can make you feel dizzy. This can increase your chance of falling. Ask your doctor what other things that you can do to help prevent falls. This information is not intended to replace advice given to you by your health care provider. Make sure you discuss any questions you have with your health care provider. Document Released: 05/03/2009 Document Revised: 12/13/2015 Document Reviewed: 08/11/2014 Elsevier Interactive Patient Education  2017 [employer].

## 2021-08-29 NOTE — Progress Notes (Signed)
Subjective:   Gabriel Wu is a 81 y.o. male who presents for Medicare Annual/Subsequent preventive examination.  Virtual Visit via Telephone Note  I connected with  Gabriel Wu on 08/29/21 at  1:30 PM EST by telephone and verified that I am speaking with the correct person using two identifiers.  Location: Patient: Home Provider: [facility] Persons participating in the virtual visit: patient/Nurse Health Advisor   I discussed the limitations, risks, security and privacy concerns of performing an evaluation and management service by telephone and the availability of in person appointments. The patient expressed understanding and agreed to proceed.  Interactive audio and video telecommunications were attempted between this nurse and patient, however failed, due to patient having technical difficulties OR patient did not have access to video capability.  We continued and completed visit with audio only.  Some vital signs may be absent or patient reported.   Gabriel Veronica E Obinna Ehresman, LPN   Review of Systems     Cardiac Risk Factors include: advanced age (>8men, >43 women);sedentary lifestyle;diabetes mellitus;dyslipidemia;hypertension;male gender     Objective:    Today's Vitals   08/29/21 1317  Weight: 206 lb (93.4 kg)   Body mass index is 29.56 kg/m.  Advanced Directives 08/29/2021 07/10/2020 05/31/2019 04/12/2016 04/12/2016  Does Patient Have a Medical Advance Directive? No Yes No Yes No  Type of Advance Directive - Living will Healthcare Power of [employer] of [place];Living will -  Does patient want to make changes to medical advance directive? - - No - Patient declined No - Patient declined -  Copy of Healthcare Power of Attorney in Chart? - - - No - copy requested -  Would patient like information on creating a medical advance directive? No - Patient declined Yes (ED - Information included in AVS) No - Patient declined - -    Current  Medications (verified) Outpatient Encounter Medications as of 08/29/2021  Medication Sig   aspirin 81 MG EC tablet Take 1 tablet (81 mg total) by mouth daily.   atorvastatin (LIPITOR) 40 MG tablet TAKE 1 TABLET BY MOUTH ONCE DAILY AT  6PM.   lisinopril-hydrochlorothiazide (ZESTORETIC) 20-12.5 MG tablet Take 1 tablet by mouth daily.   metFORMIN (GLUCOPHAGE) 1000 MG tablet Take 1 tablet (1,000 mg total) by mouth 2 (two) times daily with a meal.   omeprazole (PRILOSEC) 20 MG capsule Take 1 capsule (20 mg total) by mouth daily.   No facility-administered encounter medications on file as of 08/29/2021.    Allergies (verified) Patient has no known allergies.   History: Past Medical History:  Diagnosis Date   Hypertension    Urolithiasis    Past stone spontaneously per patient.   History reviewed. No pertinent surgical history. Family History  Problem Relation Age of Onset   Leukemia Mother    Heart attack Father    Stomach cancer Sister    Social History   Socioeconomic History   Marital status: Single    Spouse name: Not on file   Number of children: 2   Years of education: Not on file   Highest education level: Not on file  Occupational History   Occupation: retired  Tobacco Use   Smoking status: Former    Types: Cigars    Quit date: 07/21/2010    Years since quitting: 11.1   Smokeless tobacco: Never  Vaping Use   Vaping Use: Never used  Substance and Sexual Activity   Alcohol use: No   Drug use: No  Sexual activity: Never  Other Topics Concern   Not on file  Social History Narrative   Lives alone - sons live in [place]   Social Determinants of Health   Financial Resource Strain: Low Risk    Difficulty of Paying Living Expenses: Not hard at all  Food Insecurity: No Food Insecurity   Worried About Programme researcher, broadcasting/film/video in the Last Year: Never true   Barista in the Last Year: Never true  Transportation Needs: No Transportation Needs   Lack of  Transportation (Medical): No   Lack of Transportation (Non-Medical): No  Physical Activity: Insufficiently Active   Days of Exercise per Week: 4 days   Minutes of Exercise per Session: 30 min  Stress: No Stress Concern Present   Feeling of Stress : Not at all  Social Connections: Socially Isolated   Frequency of Communication with Friends and Family: More than three times a week   Frequency of Social Gatherings with Friends and Family: More than three times a week   Attends Religious Services: Never   Database administrator or Organizations: No   Attends Engineer, structural: Never   Marital Status: Divorced    Tobacco Counseling Counseling given: Not Answered   Clinical Intake:  Pre-visit preparation completed: Yes  Pain : No/denies pain     BMI - recorded: 29.56 Nutritional Status: BMI 25 -29 Overweight Nutritional Risks: None Diabetes: Yes CBG done?: No Did pt. bring in CBG monitor from home?: No  How often do you need to have someone help you when you read instructions, pamphlets, or other written materials from your doctor or pharmacy?: 1 - Never  Diabetic? Nutrition Risk Assessment:  Has the patient had any N/V/D within the last 2 months?  No  Does the patient have any non-healing wounds?  No  Has the patient had any unintentional weight loss or weight gain?  No   Diabetes:  Is the patient diabetic?  Yes  If diabetic, was a CBG obtained today?  No  Did the patient bring in their glucometer from home?  No  How often do you monitor your CBG's? never.   Financial Strains and Diabetes Management:  Are you having any financial strains with the device, your supplies or your medication? No .  Does the patient want to be seen by Chronic Care Management for management of their diabetes?  No  Would the patient like to be referred to a Nutritionist or for Diabetic Management?  No   Diabetic Exams:  Diabetic Eye Exam: Completed 01/30/2020. Overdue for  diabetic eye exam. Pt has been advised about the importance in completing this exam. He will make appt with Alben Spittle soon.  Diabetic Foot Exam: Completed 12/03/2020. Pt has been advised about the importance in completing this exam. Pt is scheduled for diabetic foot exam on next appt.    Interpreter Needed?: No  Information entered by :: Zendaya Groseclose, LPN   Activities of Daily Living In your present state of health, do you have any difficulty performing the following activities: 08/29/2021  Hearing? N  Vision? N  Difficulty concentrating or making decisions? N  Walking or climbing stairs? N  Dressing or bathing? N  Doing errands, shopping? N  Preparing Food and eating ? N  Using the Toilet? N  In the past six months, have you accidently leaked urine? N  Do you have problems with loss of bowel control? N  Managing your Medications? N  Managing your  Finances? N  Housekeeping or managing your Housekeeping? N  Some recent data might be hidden    Patient Care Team: Shade FloodGreene, Jeffrey R, MD as PCP - General (Family Medicine)  Indicate any recent Medical Services you may have received from other than [facility] providers in the past year (date may be approximate).     Assessment:   This is a routine wellness examination for Gabriel Wu.  Hearing/Vision screen Hearing Screening - Comments:: Denies hearing difficulties   Vision Screening - Comments:: Wears rx glasses - Behind with routine eye exams with [facility]ittle[facility]  Dietary issues and exercise activities discussed: Current Exercise Habits: Home exercise routine, Type of exercise: walking, Time (Minutes): 30, Frequency (Times/Week): 4, Weekly Exercise (Minutes/Week): 120, Intensity: Mild, Exercise limited by: None identified   Goals Addressed   None    Depression Screen PHQ 2/9 Scores 08/29/2021 07/01/2021 12/26/2020 12/03/2020 07/10/2020 05/14/2020 06/15/2019  PHQ - 2 Score 0 0 0 0 0 0 0  PHQ- 9 Score - - 0 - - - -    Fall Risk Fall Risk  08/29/2021  07/01/2021 02/28/2021 12/26/2020 12/03/2020  Falls in the past year? 0 0 0 0 0  Number falls in past yr: 0 0 0 - -  Injury with Fall? 0 0 0 - -  Risk for fall due to : No Fall Risks No Fall Risks No Fall Risks - -  Follow up Falls prevention discussed Falls evaluation completed Falls evaluation completed Falls evaluation completed Falls evaluation completed    FALL RISK PREVENTION PERTAINING TO THE HOME:  Any stairs in or around the home? Yes  If so, are there any without handrails? No  Home free of loose throw rugs in walkways, pet beds, electrical cords, etc? Yes  Adequate lighting in your home to reduce risk of falls? Yes   ASSISTIVE DEVICES UTILIZED TO PREVENT FALLS:  Life alert? No  Use of a cane, walker or w/c? No  Grab bars in the bathroom? Yes  Shower chair or bench in shower? No  Elevated toilet seat or a handicapped toilet? No   TIMED UP AND GO:  Was the test performed? No . Telephonic visit  Cognitive Function:     6CIT Screen 08/29/2021 07/10/2020 05/31/2019  What Year? 0 points 0 points -  What month? 0 points 0 points -  What time? 0 points 0 points -  Count back from 20 0 points 0 points 2 points  Months in reverse 2 points 2 points -  Repeat phrase 0 points 6 points -  Total Score 2 8 -    Immunizations Immunization History  Administered Date(s) Administered   Fluad Quad(high Dose 65+) 05/14/2020   Influenza Split 05/07/2007   Influenza,inj,Quad PF,6+ Mos 04/13/2016   Influenza-Unspecified 05/17/2021   Pneumococcal Conjugate-13 09/10/2017   Pneumococcal Polysaccharide-23 04/13/2016    TDAP status: Due, Education has been provided regarding the importance of this vaccine. Advised may receive this vaccine at local pharmacy or Health Dept. Aware to provide a copy of the vaccination record if obtained from local pharmacy or Health Dept. Verbalized acceptance and understanding.  Flu Vaccine status: Up to date  Pneumococcal vaccine status: Up to  date  Covid-19 vaccine status: Declined, Education has been provided regarding the importance of this vaccine but patient still declined. Advised may receive this vaccine at local pharmacy or Health Dept.or vaccine clinic. Aware to provide a copy of the vaccination record if obtained from local pharmacy or Health Dept. Verbalized acceptance and understanding.  Qualifies for Shingles Vaccine? Yes   Zostavax completed No   Shingrix Completed?: No.    Education has been provided regarding the importance of this vaccine. Patient has been advised to call insurance company to determine out of pocket expense if they have not yet received this vaccine. Advised may also receive vaccine at local pharmacy or Health Dept. Verbalized acceptance and understanding.  Screening Tests Health Maintenance  Topic Date Due   COVID-19 Vaccine (1) Never done   OPHTHALMOLOGY EXAM  01/29/2021   Zoster Vaccines- Shingrix (1 of 2) 09/29/2021 (Originally [birth year]/14/1991)   TETANUS/TDAP  12/03/2021 (Originally 06/13/1960)   FOOT EXAM  12/03/2021   HEMOGLOBIN A1C  12/30/2021   Pneumonia Vaccine 53+ Years old  Completed   INFLUENZA VACCINE  Completed   HPV VACCINES  Aged Out    Health Maintenance  Health Maintenance Due  Topic Date Due   COVID-19 Vaccine (1) Never done   OPHTHALMOLOGY EXAM  01/29/2021    Colorectal cancer screening: No longer required.   Lung Cancer Screening: (Low Dose CT Chest recommended if Age 7-80 years, 30 pack-year currently smoking OR have quit w/in 15years.) does not qualify  Additional Screening:  Hepatitis C Screening: does not qualify  Vision Screening: Recommended annual ophthalmology exams for early detection of glaucoma and other disorders of the eye. Is the patient up to date with their annual eye exam?  No  Who is the provider or what is the name of the office in which the patient attends annual eye exams? [facility] If pt is not established with a provider, would they like to be  referred to a provider to establish care? No .   Dental Screening: Recommended annual dental exams for proper oral hygiene  Community Resource Referral / Chronic Care Management: CRR required this visit?  No   CCM required this visit?  No      Plan:     I have personally reviewed and noted the following in the patients chart:   Medical and social history Use of alcohol, tobacco or illicit drugs  Current medications and supplements including opioid prescriptions. Patient is not currently taking opioid prescriptions. Functional ability and status Nutritional status Physical activity Advanced directives List of other physicians Hospitalizations, surgeries, and ER visits in previous 12 months Vitals Screenings to include cognitive, depression, and falls Referrals and appointments  In addition, I have reviewed and discussed with patient certain preventive protocols, quality metrics, and best practice recommendations. A written personalized care plan for preventive services as well as general preventive health recommendations were provided to patient.   Due to this being a telephonic visit, the after visit summary with patients personalized plan was offered to patient via mail or my-chart. Patient declined at this time.  [place] Constable, LPN   11/19/8839   Nurse Notes: None

## 2021-09-17 ENCOUNTER — Other Ambulatory Visit: Payer: Self-pay | Admitting: Family Medicine

## 2021-09-17 DIAGNOSIS — I10 Essential (primary) hypertension: Secondary | ICD-10-CM

## 2021-09-26 IMAGING — CT CT ABD-PELV W/O CM
2 of 4 series · 16 of 46 positions shown, 18 images · non-contrast
Comparison: CT the abdomen and pelvis 11/24/2004.

CLINICAL DATA: 79-year-old male with history of right lower
quadrant abdominal pain. Suspected acute appendicitis.

EXAM:
CT ABDOMEN AND PELVIS WITHOUT CONTRAST
TECHNIQUE: Multidetector CT imaging of the abdomen and pelvis was performed
following the standard protocol without IV contrast.

[Series 2: axial st · axial · 0.97mm/px · z∈[+892,+1357]mm · 13 of 109 slices shown, 15 images]
[im 8/109  soft-tissue]
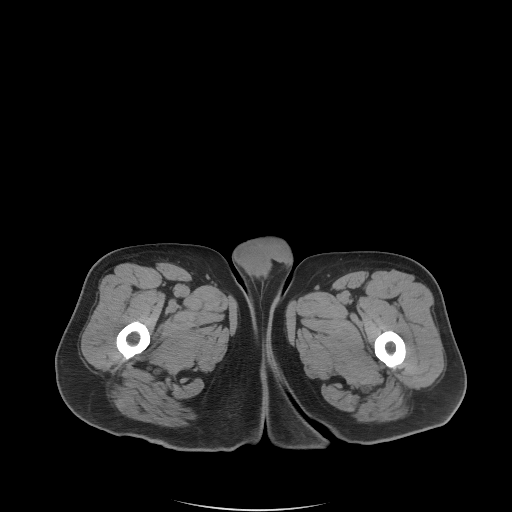
[im 8/109  bone]
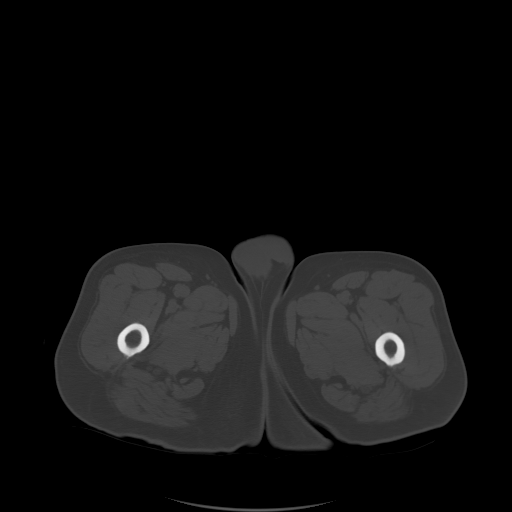
[im 15/109  soft-tissue]
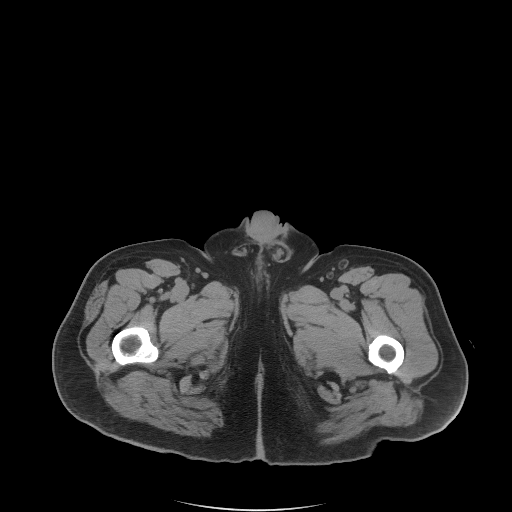
[im 22/109  soft-tissue]
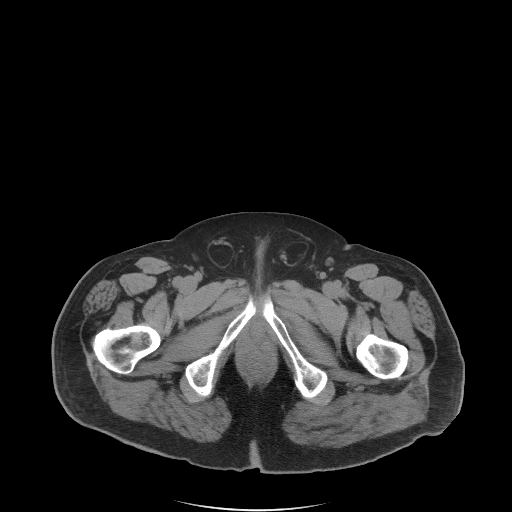
[im 29/109  soft-tissue]
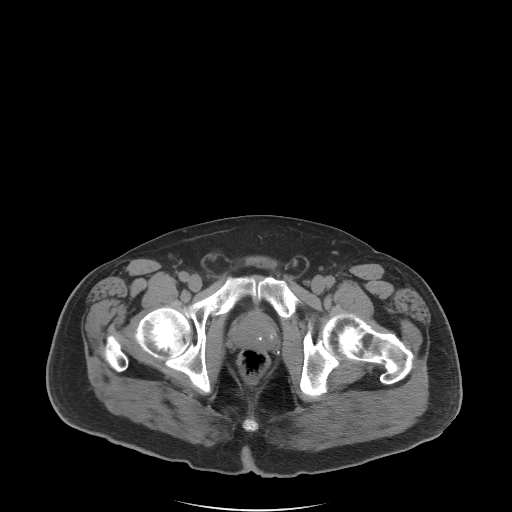
[im 37/109  soft-tissue]
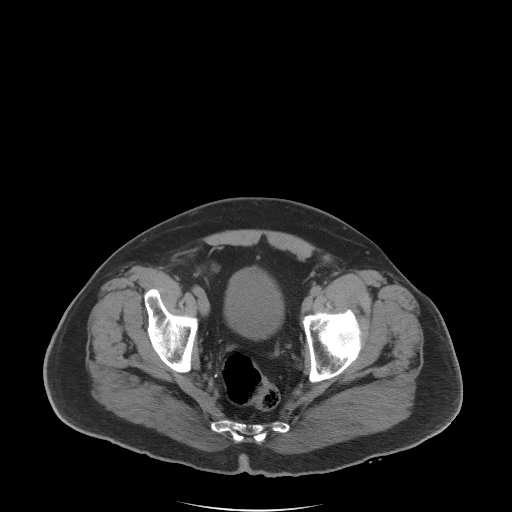
[im 44/109  soft-tissue]
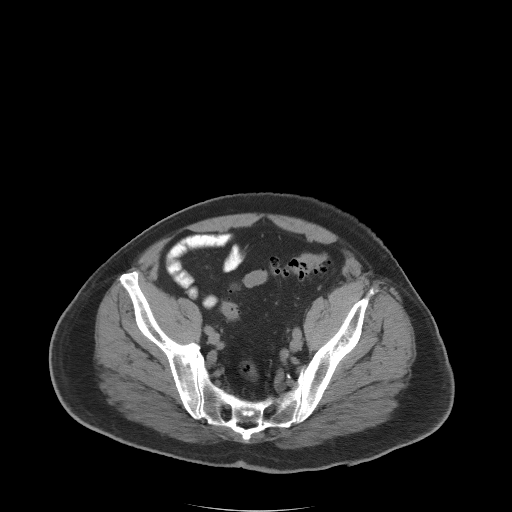
[im 58/109  soft-tissue]
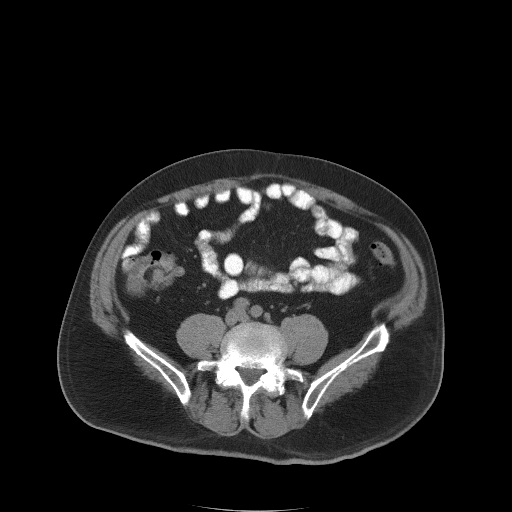
[im 65/109  soft-tissue]
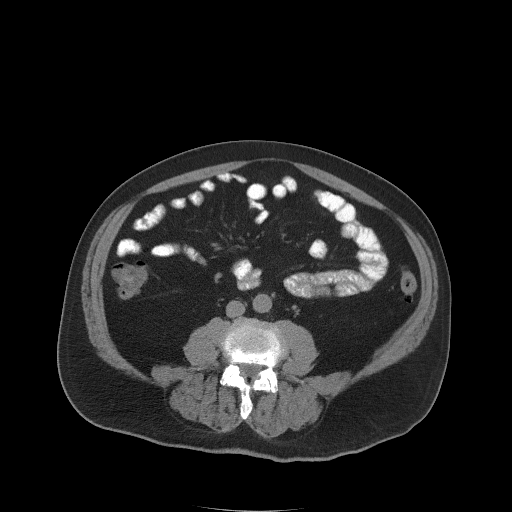
[im 73/109  soft-tissue]
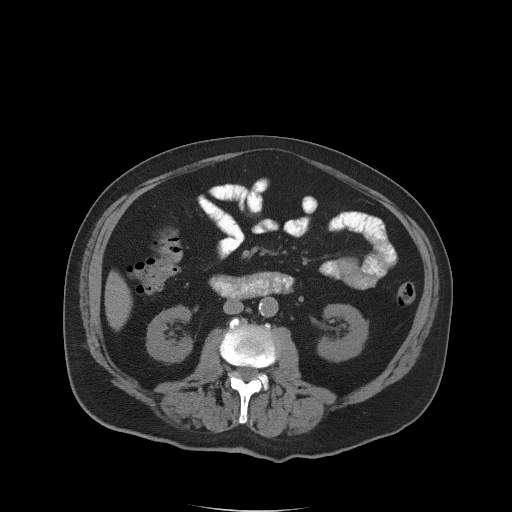
[im 73/109  bone]
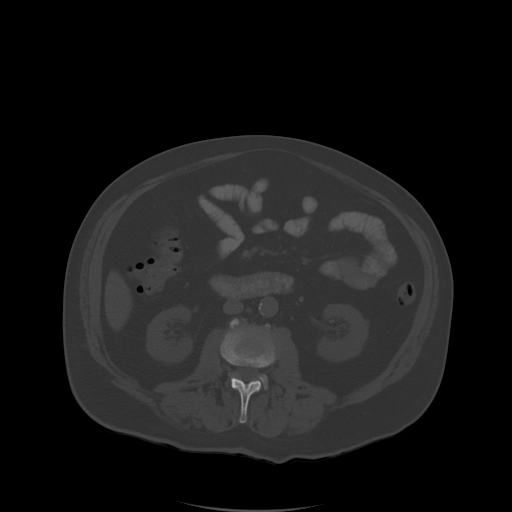
[im 80/109  soft-tissue]
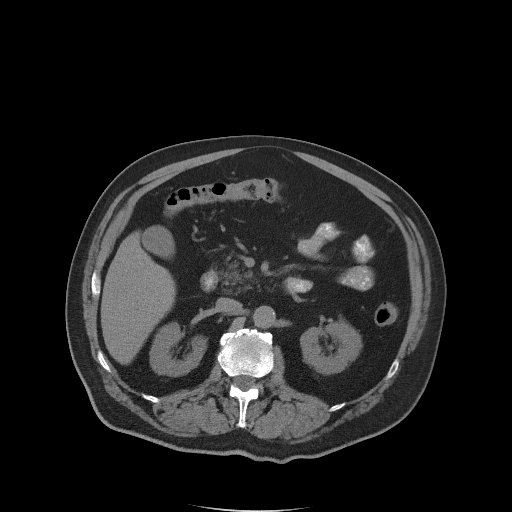
[im 87/109  soft-tissue]
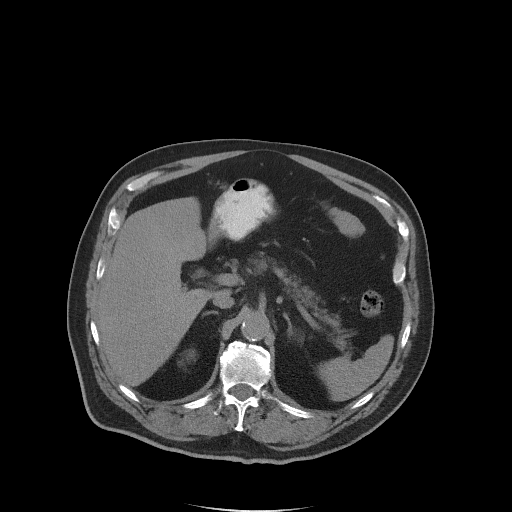
[im 94/109  soft-tissue]
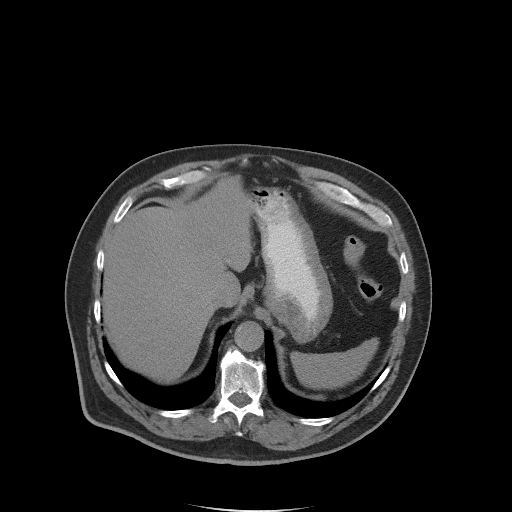
[im 101/109  soft-tissue]
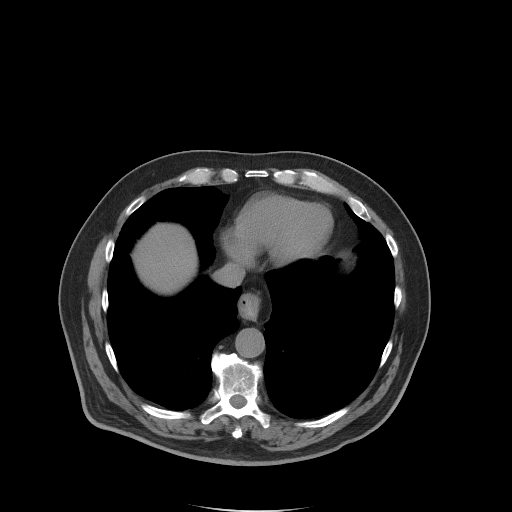

[Series 5: coronal st · coronal · 0.87mm/px · 3 of 117 slices shown]
[im 39/117  soft-tissue]
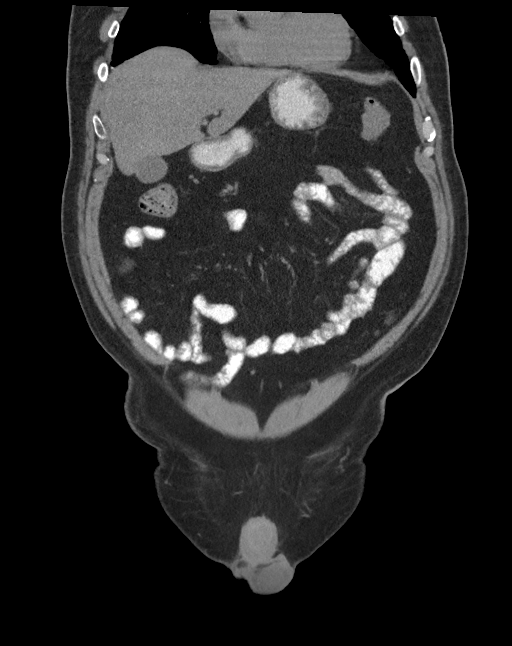
[im 52/117  soft-tissue]
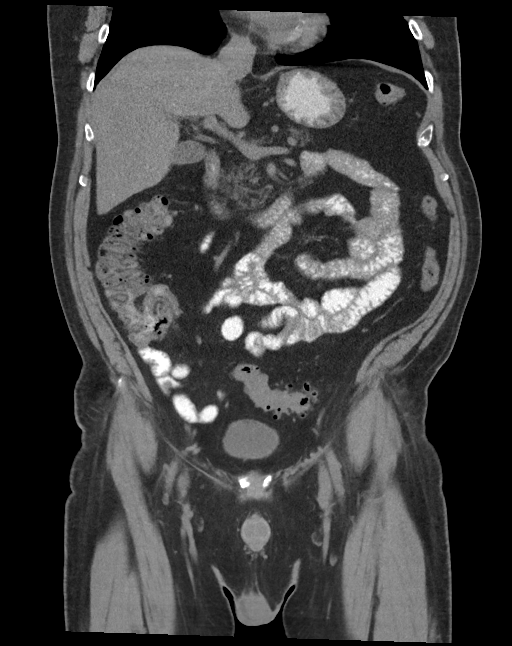
[im 65/117  soft-tissue]
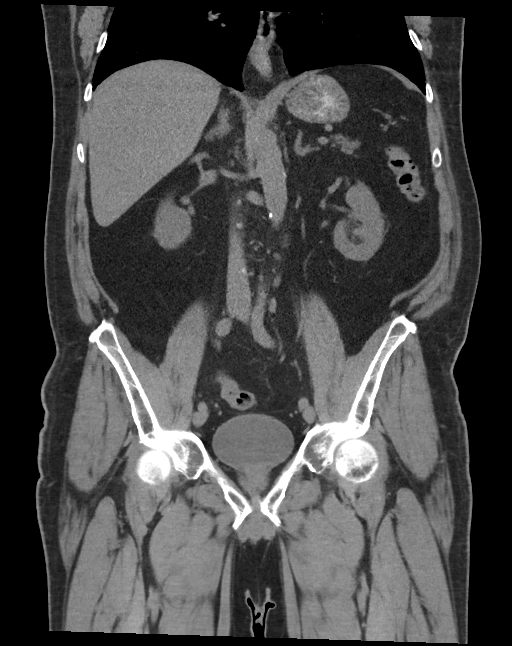

[16 of 46 positions shown; findings below may reference images not displayed]

FINDINGS: Lower chest: Atherosclerotic calcifications in the descending
thoracic aorta. Mild calcifications of the mitral annulus.

Hepatobiliary: Diffuse low attenuation throughout the hepatic
parenchyma, indicative of hepatic steatosis. Small calcified
granuloma in segment 4 B of the liver. No other suspicious hepatic
lesions are confidently identified on today's noncontrast CT
examination. Unenhanced appearance of the gallbladder is normal.

Pancreas: No definite pancreatic mass or peripancreatic fluid
collections or inflammatory changes are noted on today's noncontrast
CT examination.

Spleen: Unremarkable.

Adrenals/Urinary Tract: Bilateral kidneys and adrenal glands are
normal in appearance. No hydroureteronephrosis. Urinary bladder is
normal in appearance.

Stomach/Bowel: Unenhanced appearance of the stomach is normal. No
pathologic dilatation of small bowel or colon. Numerous colonic
diverticulae are noted, without surrounding inflammatory changes to
suggest an acute diverticulitis at this time. Normal appendix.

Vascular/Lymphatic: Aortic atherosclerosis. Retroaortic left renal
vein (normal anatomical variant) incidentally noted. No
lymphadenopathy noted in the abdomen or pelvis.

Reproductive: Prostate gland and seminal vesicles are unremarkable
in appearance.

Other: No significant volume of ascites.  No pneumoperitoneum.

Musculoskeletal: There are no aggressive appearing lytic or blastic
lesions noted in the visualized portions of the skeleton.
IMPRESSION: 1. No acute findings are noted in the abdomen or pelvis to account
for the patient's symptoms. Specifically, the appendix is normal.
2. Colonic diverticulosis without evidence of acute diverticulitis
at this time.
3. Aortic atherosclerosis.
4. Additional incidental findings, as above.

## 2021-09-30 ENCOUNTER — Ambulatory Visit (INDEPENDENT_AMBULATORY_CARE_PROVIDER_SITE_OTHER): Payer: Medicare HMO | Admitting: Family Medicine

## 2021-09-30 ENCOUNTER — Encounter: Payer: Self-pay | Admitting: Family Medicine

## 2021-09-30 VITALS — BP 146/70 | HR 101 | Temp 98.1°F | Resp 16 | Wt 207.8 lb

## 2021-09-30 DIAGNOSIS — R0982 Postnasal drip: Secondary | ICD-10-CM | POA: Diagnosis not present

## 2021-09-30 DIAGNOSIS — R809 Proteinuria, unspecified: Secondary | ICD-10-CM | POA: Diagnosis not present

## 2021-09-30 DIAGNOSIS — G478 Other sleep disorders: Secondary | ICD-10-CM | POA: Diagnosis not present

## 2021-09-30 DIAGNOSIS — I10 Essential (primary) hypertension: Secondary | ICD-10-CM | POA: Diagnosis not present

## 2021-09-30 DIAGNOSIS — E1129 Type 2 diabetes mellitus with other diabetic kidney complication: Secondary | ICD-10-CM | POA: Diagnosis not present

## 2021-09-30 LAB — POCT GLYCOSYLATED HEMOGLOBIN (HGB A1C): Hemoglobin A1C: 7.6 % — AB (ref 4.0–5.6)

## 2021-09-30 MED ORDER — FLUTICASONE PROPIONATE 50 MCG/ACT NA SUSP: 2.0000 | Freq: Every day | NASAL | 6 refills | Status: DC

## 2021-09-30 MED ORDER — EMPAGLIFLOZIN 10 MG PO TABS: 10.0000 mg | ORAL_TABLET | Freq: Every day | ORAL | 2 refills | Status: DC

## 2021-09-30 NOTE — Patient Instructions (Addendum)
Call Dr. Kathlen Mody for appointment for diabetic eye screening.  ?Continue same dose metformin for now.  Add new medication Jardiance once per day.  Watch for side effects such as yeast infection or urine infections as we discussed and let me know if those occur. ?Recheck diabetes test in 3 months.  If that medication is too costly let me know and we can look at alternatives. ? ?Try fluticasone nasal spray once per day to see if that helps with the postnasal drip and nighttime symptoms.  Let me know if that is not improving in the next week to 10 days. ? ?Check your blood pressures at home and if numbers remain in the 140s in the next week to let me know as we may need to adjust your medication.  Blood pressures were okay in August and December of last year.  Try to minimize sodium in the diet, see list of foods on handout.  ? ?

## 2021-09-30 NOTE — Progress Notes (Signed)
? ?Subjective:  ?Patient ID: Gabriel Wu, male    DOB: 1940-08-24  Age: 81 y.o. MRN: [id number] ? ?CC:  ?Chief Complaint  ?Patient presents with  ? Diabetes  ? ? ?HPI ?Gabriel Wu presents for  ? ?Diabetes: ?With microalbuminuria ?Metformin 850 mg twice daily increased to 1000 mg twice daily based on elevated readings in December. ?On ACE inhibitor and statin. ?Occasional diarrhea - no change from 850mg  dose. Every few days.  ?Firm stool at times, followed by some diarrhea. BM's daily. No straining o hard stools.  ?Home readings: none.  ?Microalbumin: 5.2 in May 2022 ?Optho, foot exam, pneumovax:  ?Ophthalmology: due. Dr. June 2022.  ?No soda/sweet tea.  ?Exercise bike usually 30 min per day.  ?Lab Results  ?Component Value Date  ? HGBA1C 7.8 (H) 07/01/2021  ? HGBA1C 7.8 (H) 03/14/2021  ? HGBA1C 8.1 (H) 12/03/2020  ? ?Lab Results  ?Component Value Date  ? MICROALBUR 5.2 (H) 12/03/2020  ? LDLCALC 32 12/03/2020  ? CREATININE 1.37 07/08/2021  ? ? ?Insomnia: ?Goes to sleep ok. Wakes up past week or so. Postnasal drip wakes him up to have to go spit it out. Notes at night, not during day. Rare sneeze during day. Min daytime nasal congestion.  ?No nocturnal dyspnea, no CP.  ? ? ?Hypertension: ?Denies any recent missed doses of medication or new added salt to food. ?Home readings: 147/70. ?BP Readings from Last 3 Encounters:  ?09/30/21 (!) 146/70  ?07/01/21 130/78  ?02/28/21 128/78  ? ?Lab Results  ?Component Value Date  ? CREATININE 1.37 07/08/2021  ? ? ? ?History ?Patient Active Problem List  ? Diagnosis Date Noted  ? DM (diabetes mellitus) (HCC) 05/12/2016  ? Hyperlipemia   ? Chest pain 04/12/2016  ? Essential hypertension 04/12/2016  ? Dyspepsia 04/12/2016  ? ?Past Medical History:  ?Diagnosis Date  ? Hypertension   ? Urolithiasis   ? Past stone spontaneously per patient.  ? ?No past surgical history on file. ?No Known Allergies ?Prior to Admission medications   ?Medication Sig Start Date End Date Taking?  Authorizing Provider  ?aspirin 81 MG EC tablet Take 1 tablet (81 mg total) by mouth daily. 08/29/19  Yes 10/27/19, MD  ?atorvastatin (LIPITOR) 40 MG tablet TAKE 1 TABLET BY MOUTH ONCE DAILY AT  6PM. 02/28/21  Yes 04/30/21, MD  ?lisinopril-hydrochlorothiazide (ZESTORETIC) 20-12.5 MG tablet Take 1 tablet by mouth once daily 09/17/21  Yes 09/19/21, MD  ?metFORMIN (GLUCOPHAGE) 1000 MG tablet Take 1 tablet (1,000 mg total) by mouth 2 (two) times daily with a meal. 07/15/21  Yes 07/17/21, MD  ?omeprazole (PRILOSEC) 20 MG capsule Take 1 capsule (20 mg total) by mouth daily. 02/28/21  Yes 04/30/21, MD  ? ?Social History  ? ?Socioeconomic History  ? Marital status: Single  ?  Spouse name: Not on file  ? Number of children: 2  ? Years of education: Not on file  ? Highest education level: Not on file  ?Occupational History  ? Occupation: retired  ?Tobacco Use  ? Smoking status: Former  ?  Types: Cigars  ?  Quit date: 07/21/2010  ?  Years since quitting: 11.2  ? Smokeless tobacco: Never  ?Vaping Use  ? Vaping Use: Never used  ?Substance and Sexual Activity  ? Alcohol use: No  ? Drug use: No  ? Sexual activity: Never  ?Other Topics Concern  ? Not on file  ?Social History Narrative  ? Lives alone -  sons live in [place]  ? ?Social Determinants of Health  ? ?Financial Resource Strain: Low Risk   ? Difficulty of Paying Living Expenses: Not hard at all  ?Food Insecurity: No Food Insecurity  ? Worried About Programme researcher, broadcasting/film/videounning Out of Food in the Last Year: Never true  ? Ran Out of Food in the Last Year: Never true  ?Transportation Needs: No Transportation Needs  ? Lack of Transportation (Medical): No  ? Lack of Transportation (Non-Medical): No  ?Physical Activity: Insufficiently Active  ? Days of Exercise per Week: 4 days  ? Minutes of Exercise per Session: 30 min  ?Stress: No Stress Concern Present  ? Feeling of Stress : Not at all  ?Social Connections: Socially Isolated  ? Frequency of Communication with  Friends and Family: More than three times a week  ? Frequency of Social Gatherings with Friends and Family: More than three times a week  ? Attends Religious Services: Never  ? Active Member of Clubs or Organizations: No  ? Attends BankerClub or Organization Meetings: Never  ? Marital Status: Divorced  ?Intimate Partner Violence: Not At Risk  ? Fear of Current or Ex-Partner: No  ? Emotionally Abused: No  ? Physically Abused: No  ? Sexually Abused: No  ? ? ?Review of Systems  ?Constitutional:  Negative for fatigue and unexpected weight change.  ?Eyes:  Negative for visual disturbance.  ?Respiratory:  Negative for cough, chest tightness and shortness of breath.   ?Cardiovascular:  Negative for chest pain, palpitations and leg swelling.  ?Gastrointestinal:  Negative for abdominal pain and blood in stool.  ?Neurological:  Negative for dizziness, light-headedness and headaches.  ? ? ?Objective:  ? ?Vitals:  ? 09/30/21 1428  ?BP: (!) 142/76  ?Pulse: (!) 101  ?Resp: 16  ?Temp: 98.1 ?F (36.7 ?C)  ?SpO2: 96%  ?Weight: 207 lb 12.8 oz (94.3 kg)  ? ? ?Physical Exam ?Vitals reviewed.  ?Constitutional:   ?   Appearance: He is well-developed.  ?HENT:  ?   Head: Normocephalic and atraumatic.  ?Neck:  ?   Vascular: No carotid bruit or JVD.  ?Cardiovascular:  ?   Rate and Rhythm: Normal rate and regular rhythm.  ?   Heart sounds: Normal heart sounds. No murmur heard. ?Pulmonary:  ?   Effort: Pulmonary effort is normal.  ?   Breath sounds: Normal breath sounds. No rales.  ?Musculoskeletal:  ?   Right lower leg: No edema.  ?   Left lower leg: No edema.  ?Skin: ?   General: Skin is warm and dry.  ?Neurological:  ?   Mental Status: He is alert and oriented to person, place, and time.  ?Psychiatric:     ?   Mood and Affect: Mood normal.  ? ? ?Results for orders placed or performed in visit on 09/30/21  ?POCT glycosylated hemoglobin (Hb A1C)  ?Result Value Ref Range  ? Hemoglobin A1C 7.6 (A) 4.0 - 5.6 %  ? HbA1c POC (<> result, manual entry)     ? HbA1c, POC (prediabetic range)    ? HbA1c, POC (controlled diabetic range)    ? ? ? ? ?Assessment & Plan:  ?Gabriel Wu is a 81 y.o. male . ?Type 2 diabetes mellitus with microalbuminuria, without long-term current use of insulin (HCC) - Plan: POCT glycosylated hemoglobin (Hb A1C) ? -Decreased control.  We will add low-dose Jardiance.  Same dose for now.  RTC precautions if increased GI symptoms with metformin, as well as potential risk of mycotic or  urinary infections with Jardiance.  75-month follow-up for A1c ? ?Postnasal drip - Plan: fluticasone (FLONASE) 50 MCG/ACT nasal spray ?Wakes up during night ? -Suspected postnasal drip from allergic rhinitis.  Start Flonase, correct technique discussed.  RTC precautions if persistent symptoms ? ?Meds ordered this encounter  ?Medications  ? fluticasone (FLONASE) 50 MCG/ACT nasal spray  ?  Sig: Place 2 sprays into both nostrils daily.  ?  Dispense:  16 g  ?  Refill:  6  ? empagliflozin (JARDIANCE) 10 MG TABS tablet  ?  Sig: Take 1 tablet (10 mg total) by mouth daily.  ?  Dispense:  30 tablet  ?  Refill:  2  ? ?Patient Instructions  ?Call Dr. Alben Spittle for appointment for diabetic eye screening.  ? ? ? ?Signed,  ? ?Meredith Staggers, MD ?[facility], [facility] ?[facility] ?09/30/21 ?3:31 PM ? ? ?

## 2021-12-02 ENCOUNTER — Other Ambulatory Visit: Payer: Self-pay | Admitting: Family Medicine

## 2021-12-02 DIAGNOSIS — E785 Hyperlipidemia, unspecified: Secondary | ICD-10-CM

## 2021-12-17 ENCOUNTER — Other Ambulatory Visit: Payer: Self-pay | Admitting: Family Medicine

## 2021-12-17 DIAGNOSIS — I10 Essential (primary) hypertension: Secondary | ICD-10-CM

## 2021-12-23 ENCOUNTER — Other Ambulatory Visit: Payer: Self-pay | Admitting: Family Medicine

## 2021-12-23 DIAGNOSIS — K219 Gastro-esophageal reflux disease without esophagitis: Secondary | ICD-10-CM

## 2022-01-01 ENCOUNTER — Encounter: Payer: Self-pay | Admitting: Family Medicine

## 2022-01-01 ENCOUNTER — Ambulatory Visit (INDEPENDENT_AMBULATORY_CARE_PROVIDER_SITE_OTHER): Payer: Medicare HMO | Admitting: Family Medicine

## 2022-01-01 VITALS — BP 122/74 | HR 78 | Temp 98.1°F | Resp 16 | Ht 70.0 in | Wt 198.4 lb

## 2022-01-01 DIAGNOSIS — E1129 Type 2 diabetes mellitus with other diabetic kidney complication: Secondary | ICD-10-CM

## 2022-01-01 DIAGNOSIS — R42 Dizziness and giddiness: Secondary | ICD-10-CM | POA: Diagnosis not present

## 2022-01-01 DIAGNOSIS — I10 Essential (primary) hypertension: Secondary | ICD-10-CM | POA: Diagnosis not present

## 2022-01-01 DIAGNOSIS — R809 Proteinuria, unspecified: Secondary | ICD-10-CM | POA: Diagnosis not present

## 2022-01-01 DIAGNOSIS — R197 Diarrhea, unspecified: Secondary | ICD-10-CM

## 2022-01-01 LAB — GLUCOSE, POCT (MANUAL RESULT ENTRY): POC Glucose: 116 mg/dl — AB (ref 70–99)

## 2022-01-01 MED ORDER — BLOOD GLUCOSE METER KIT: PACK | 0 refills | Status: AC

## 2022-01-01 NOTE — Progress Notes (Signed)
Subjective:  Patient ID: Gabriel Wu, male    DOB: 1941/05/10  Age: 81 y.o. MRN: [id number]  CC:  Chief Complaint  Patient presents with   Diabetes    Pt here for check on diabetes, notes jardiance maybe causing dizzy spells been getting dizzy in the afternoon about an hour after taking it, does not take BG notes does not have a monitor     HPI Gabriel Wu presents for   Diabetes: Decreased control at last visit in March.  Added low-dose Jardiance.  Potential side effects discussed.  Has noticed dizziness in the afternoon since starting the new meds (flonase, jardiance). Notices about an hour after taking the jardiance. Notes with standing up after being seated for awhile.  No falls. No syncope.  No CP/palpitations.  Not checking blood sugar - no meter.  Has not taken jardiance yet today.  Drinking water throughout day.  Diarrhea past month. Initial BM, then diarrhea 56min later after 1st BM.  Metformin 1000mg  BID.  Lab Results  Component Value Date   CREATININE 1.37 07/08/2021    Results for orders placed or performed in visit on 01/01/22  POCT glucose (manual entry)  Result Value Ref Range   POC Glucose 116 (A) 70 - 99 mg/dl   BP Readings from Last 3 Encounters:  01/01/22 122/74  09/30/21 (!) 146/70  07/01/21 130/78     Lab Results  Component Value Date   HGBA1C 7.6 (A) 09/30/2021   HGBA1C 7.8 (H) 07/01/2021   HGBA1C 7.8 (H) 03/14/2021   Lab Results  Component Value Date   MICROALBUR 5.2 (H) 12/03/2020   LDLCALC 32 12/03/2020   CREATININE 1.37 07/08/2021     History Patient Active Problem List   Diagnosis Date Noted   DM (diabetes mellitus) (Export) 05/12/2016   Hyperlipemia    Chest pain 04/12/2016   Essential hypertension 04/12/2016   Dyspepsia 04/12/2016   Past Medical History:  Diagnosis Date   Hypertension    Urolithiasis    Past stone spontaneously per patient.   No past surgical history on file. No Known Allergies Prior to  Admission medications   Medication Sig Start Date End Date Taking? Authorizing Provider  aspirin 81 MG EC tablet Take 1 tablet (81 mg total) by mouth daily. 08/29/19  Yes Wendie Agreste, MD  atorvastatin (LIPITOR) 40 MG tablet TAKE 1 TABLET BY MOUTH ONCE DAILY AT 6PM. 12/02/21  Yes Wendie Agreste, MD  empagliflozin (JARDIANCE) 10 MG TABS tablet Take 1 tablet (10 mg total) by mouth daily. 09/30/21  Yes Wendie Agreste, MD  fluticasone (FLONASE) 50 MCG/ACT nasal spray Place 2 sprays into both nostrils daily. 09/30/21  Yes Wendie Agreste, MD  lisinopril-hydrochlorothiazide (ZESTORETIC) 20-12.5 MG tablet Take 1 tablet by mouth once daily 12/18/21  Yes Wendie Agreste, MD  metFORMIN (GLUCOPHAGE) 1000 MG tablet Take 1 tablet (1,000 mg total) by mouth 2 (two) times daily with a meal. 07/15/21  Yes Wendie Agreste, MD  omeprazole (PRILOSEC) 20 MG capsule Take 1 capsule by mouth once daily 12/23/21  Yes Wendie Agreste, MD   Social History   Socioeconomic History   Marital status: Single    Spouse name: Not on file   Number of children: 2   Years of education: Not on file   Highest education level: Not on file  Occupational History   Occupation: retired  Tobacco Use   Smoking status: Former    Types: Cigars    Quit date:  07/21/2010    Years since quitting: 11.4   Smokeless tobacco: Never  Vaping Use   Vaping Use: Never used  Substance and Sexual Activity   Alcohol use: No   Drug use: No   Sexual activity: Never  Other Topics Concern   Not on file  Social History Narrative   Lives alone - sons live in [place]   Social Determinants of Health   Financial Resource Strain: Low Risk  (08/29/2021)   Overall Financial Resource Strain (CARDIA)    Difficulty of Paying Living Expenses: Not hard at all  Food Insecurity: No Food Insecurity (08/29/2021)   Hunger Vital Sign    Worried About Running Out of Food in the Last Year: Never true    Ran Out of Food in the Last Year: Never true   Transportation Needs: No Transportation Needs (08/29/2021)   PRAPARE - Hydrologist (Medical): No    Lack of Transportation (Non-Medical): No  Physical Activity: Insufficiently Active (08/29/2021)   Exercise Vital Sign    Days of Exercise per Week: 4 days    Minutes of Exercise per Session: 30 min  Stress: No Stress Concern Present (08/29/2021)   [place]    Feeling of Stress : Not at all  Social Connections: Socially Isolated (08/29/2021)   Social Connection and Isolation Panel [NHANES]    Frequency of Communication with Friends and Family: More than three times a week    Frequency of Social Gatherings with Friends and Family: More than three times a week    Attends Religious Services: Never    Marine scientist or Organizations: No    Attends Archivist Meetings: Never    Marital Status: Divorced  Human resources officer Violence: Not At Risk (08/29/2021)   Humiliation, Afraid, Rape, and Kick questionnaire    Fear of Current or Ex-Partner: No    Emotionally Abused: No    Physically Abused: No    Sexually Abused: No    Review of Systems Per HPI.   Objective:   Vitals:   01/01/22 1415  BP: 122/74  Pulse: 78  Resp: 16  Temp: 98.1 F (36.7 C)  TempSrc: Temporal  SpO2: 97%  Weight: 198 lb 6.4 oz (90 kg)  Height: 5\' 10"  (1.778 m)    Physical Exam Vitals reviewed.  Constitutional:      Appearance: He is well-developed.  HENT:     Head: Normocephalic and atraumatic.  Neck:     Vascular: No carotid bruit or JVD.  Cardiovascular:     Rate and Rhythm: Normal rate and regular rhythm.     Heart sounds: Normal heart sounds. No murmur heard. Pulmonary:     Effort: Pulmonary effort is normal.     Breath sounds: Normal breath sounds. No rales.  Musculoskeletal:     Right lower leg: No edema.     Left lower leg: No edema.  Skin:    General: Skin is warm and dry.   Neurological:     Mental Status: He is alert and oriented to person, place, and time.  Psychiatric:        Mood and Affect: Mood normal.        Assessment & Plan:  Gabriel Wu is a 81 y.o. male . Type 2 diabetes mellitus with microalbuminuria, without long-term current use of insulin (HCC) - Plan: POCT glucose (manual entry), Hemoglobin A1c Episode of dizziness - Plan: Basic metabolic panel,  CBC, Orthostatic vital signs  -Dizziness is new issue.  Timing is seems to be after dosing of Jardiance.  Also with diarrhea, question of low volume state.  Hold SGLT2 at this time.  Check labs with CBC, BMP to recheck renal function.  May need to decrease metformin dose to 850 mg if persistent diarrhea, may need to add DPP 4, check labs and follow-up in the next 3 weeks.  Meter given for home monitoring.  RTC/ER precautions if worsening symptoms.  Essential hypertension - Plan: Orthostatic vital signs  -Stable, minimal decrease in BP on orthostatics.  Unlikely blood pressure med issue.  Start with SGLT2 change as above, work on treating diarrhea.  Check creatinine and adjust medications accordingly if needed.  Diarrhea, unspecified type - Plan: Basic metabolic panel  -Few episodes per day.  Less likely infectious, high-dose metformin may be contributing.  Option to lower dose to 850 mg twice daily with RTC precautions.  No orders of the defined types were placed in this encounter.  Patient Instructions  Make sure you are drinking plenty of fluids.  Stop jardiance for now. If dizziness returns off that med, let me know.  We may try different med called [place], but I will look at labs first.  If diarrhea is not improving in next week, decrease the metformin to 850mg  dose. Let me know if you make that change. Also I want to know if that helps the diarrhea.     Signed,   Merri Ray, MD [facility] 01/01/22 2:46 PM

## 2022-01-01 NOTE — Patient Instructions (Addendum)
Make sure you are drinking plenty of fluids.  Stop jardiance for now. If dizziness returns off that med, let me know.  We may try different med called [place], but I will look at labs first.  If diarrhea is not improving in next week, decrease the metformin to 850mg  dose. Let me know if you make that change. Also I want to know if that helps the diarrhea.  Return to the clinic or go to the nearest emergency room if any of your symptoms worsen or new symptoms occur.

## 2022-01-02 LAB — BASIC METABOLIC PANEL
BUN: 20 mg/dL (ref 6–23)
CO2: 26 mEq/L (ref 19–32)
Calcium: 10 mg/dL (ref 8.4–10.5)
Chloride: 101 mEq/L (ref 96–112)
Creatinine, Ser: 1.7 mg/dL — ABNORMAL HIGH (ref 0.40–1.50)
GFR: 37.59 mL/min — ABNORMAL LOW (ref >60.00)
Glucose, Bld: 88 mg/dL (ref 70–99)
Potassium: 4.2 mEq/L (ref 3.5–5.1)
Sodium: 139 mEq/L (ref 135–145)

## 2022-01-02 LAB — CBC
HCT: 43.8 % (ref 39.0–52.0)
Hemoglobin: 14.7 g/dL (ref 13.0–17.0)
MCHC: 33.5 g/dL (ref 30.0–36.0)
MCV: 84.9 fl (ref 78.0–100.0)
Platelets: 264 10*3/uL (ref 150.0–400.0)
RBC: 5.16 Mil/uL (ref 4.22–5.81)
RDW: 15.1 % (ref 11.5–15.5)
WBC: 10.1 10*3/uL (ref 4.0–10.5)

## 2022-01-02 LAB — HEMOGLOBIN A1C: Hgb A1c MFr Bld: 7.3 % — ABNORMAL HIGH (ref 4.6–6.5)

## 2022-01-08 ENCOUNTER — Other Ambulatory Visit: Payer: Self-pay | Admitting: Family Medicine

## 2022-01-08 DIAGNOSIS — R7989 Other specified abnormal findings of blood chemistry: Secondary | ICD-10-CM

## 2022-01-08 NOTE — Progress Notes (Signed)
See labs 

## 2022-01-09 ENCOUNTER — Telehealth: Payer: Self-pay

## 2022-01-09 ENCOUNTER — Other Ambulatory Visit: Payer: Self-pay

## 2022-01-09 DIAGNOSIS — E1129 Type 2 diabetes mellitus with other diabetic kidney complication: Secondary | ICD-10-CM

## 2022-01-09 NOTE — Telephone Encounter (Signed)
-----   Message from [name], MD sent at 01/08/2022  9:51 PM EDT ----- Call patient.  Kidney function test increased from previous baseline.  Electrolytes overall looked okay.  82-month blood sugar test has improved, and blood sugar in the office was normal.  Stopping Jardiance may be the right idea.  Blood counts were normal.  Please return for lab only visit in the next week to repeat kidney function test.  Make sure to avoid NSAIDs like Advil or Aleve, make sure to stay hydrated.  Let me know if there are questions.

## 2022-01-09 NOTE — Telephone Encounter (Signed)
Informed pt of lab results and scheduling his lab visit now .

## 2022-01-16 ENCOUNTER — Other Ambulatory Visit (INDEPENDENT_AMBULATORY_CARE_PROVIDER_SITE_OTHER): Payer: Medicare HMO

## 2022-01-16 DIAGNOSIS — E1129 Type 2 diabetes mellitus with other diabetic kidney complication: Secondary | ICD-10-CM

## 2022-01-16 DIAGNOSIS — R809 Proteinuria, unspecified: Secondary | ICD-10-CM | POA: Diagnosis not present

## 2022-01-16 LAB — BASIC METABOLIC PANEL
BUN: 25 mg/dL — ABNORMAL HIGH (ref 6–23)
CO2: 28 mEq/L (ref 19–32)
Calcium: 10.2 mg/dL (ref 8.4–10.5)
Chloride: 99 mEq/L (ref 96–112)
Creatinine, Ser: 1.58 mg/dL — ABNORMAL HIGH (ref 0.40–1.50)
GFR: 41.03 mL/min — ABNORMAL LOW (ref >60.00)
Glucose, Bld: 141 mg/dL — ABNORMAL HIGH (ref 70–99)
Potassium: 3.9 mEq/L (ref 3.5–5.1)
Sodium: 137 mEq/L (ref 135–145)

## 2022-01-30 ENCOUNTER — Ambulatory Visit (INDEPENDENT_AMBULATORY_CARE_PROVIDER_SITE_OTHER): Payer: Medicare HMO | Admitting: Family Medicine

## 2022-01-30 ENCOUNTER — Encounter: Payer: Self-pay | Admitting: Family Medicine

## 2022-01-30 VITALS — BP 124/68 | HR 81 | Temp 97.5°F | Resp 17

## 2022-01-30 DIAGNOSIS — R809 Proteinuria, unspecified: Secondary | ICD-10-CM

## 2022-01-30 DIAGNOSIS — R197 Diarrhea, unspecified: Secondary | ICD-10-CM | POA: Diagnosis not present

## 2022-01-30 DIAGNOSIS — R42 Dizziness and giddiness: Secondary | ICD-10-CM | POA: Diagnosis not present

## 2022-01-30 DIAGNOSIS — R222 Localized swelling, mass and lump, trunk: Secondary | ICD-10-CM | POA: Diagnosis not present

## 2022-01-30 DIAGNOSIS — R7989 Other specified abnormal findings of blood chemistry: Secondary | ICD-10-CM | POA: Diagnosis not present

## 2022-01-30 DIAGNOSIS — E1129 Type 2 diabetes mellitus with other diabetic kidney complication: Secondary | ICD-10-CM | POA: Diagnosis not present

## 2022-01-30 LAB — GLUCOSE, POCT (MANUAL RESULT ENTRY): POC Glucose: 141 mg/dl — AB (ref 70–99)

## 2022-01-30 MED ORDER — METFORMIN HCL 500 MG PO TABS: 500.0000 mg | ORAL_TABLET | Freq: Two times a day (BID) | ORAL | 1 refills | Status: DC

## 2022-01-30 NOTE — Progress Notes (Addendum)
Subjective:  Patient ID: Gabriel Wu, male    DOB: 04-08-1941  Age: 81 y.o. MRN: [id number]  CC:  Chief Complaint  Patient presents with   Diarrhea    Pt notes better not Resolved notes some soft stools some diarrhea, notes he ran out of 800 mg metformin and started on 500 mg, also notes he re started jardiance about 2 days ago and that is when bm changed     HPI Gabriel Wu presents for  Diarrhea Follow-up from June 14.  New dizziness at that time, seem to be after dosing of Jardiance.  Also with diarrhea at that time, held SGLT2 given possible low volume state.  Discussed possible lower dose of metformin, and DPP 4.   Dizziness resolved after last visit. No recurrence. Restarted jardiance few days ago and no return for dizziness.  Diarrhea improved with metformin 1060m to 8510mBID, then ran out of that dose - started 50071mID. Some slight diarrhea this am, but usually soft stools. Hard stool yesterday.  Creatinine increased from 1.37 in 06/2021, to 1.20 01/01/22. Improved to 1.58 with GFR 41 on 6/29 ( still taking 850m84mse metformin at that time).  Home readings 116-160, up to 269. Usually in mid to low 100's.  141 on 6/29 labs.  Results for orders placed or performed in visit on 01/30/22  POCT glucose (manual entry)  Result Value Ref Range   POC Glucose 141 (A) 70 - 99 mg/dl   Lab Results  Component Value Date   HGBA1C 7.3 (H) 01/01/2022    Swollen area of chest  Noted on R front when taking shower week ago, no skin changes, breast d/c, or pain. No cough, hemoptysis or dyspnea. No night sweats/fevers  or unexplained wt loss.    History Patient Active Problem List   Diagnosis Date Noted   DM (diabetes mellitus) (HCC)[place]/23/2017   Hyperlipemia    Chest pain 04/12/2016   Essential hypertension 04/12/2016   Dyspepsia 04/12/2016   Past Medical History:  Diagnosis Date   Hypertension    Urolithiasis    Past stone spontaneously per patient.   No past  surgical history on file. No Known Allergies Prior to Admission medications   Medication Sig Start Date End Date Taking? Authorizing Provider  aspirin 81 MG EC tablet Take 1 tablet (81 mg total) by mouth daily. 08/29/19   GreeWendie Agreste  atorvastatin (LIPITOR) 40 MG tablet TAKE 1 TABLET BY MOUTH ONCE DAILY AT 6PM. 12/02/21   GreeWendie Agreste  blood glucose meter kit and supplies Dispense based on patient and insurance preference. Use once per day and if symptoms of low blood sugar. (FOR ICD-10 E10.9, E11.9). 01/01/22   GreeWendie Agreste  fluticasone (FLONASE) 50 MCG/ACT nasal spray Place 2 sprays into both nostrils daily. 09/30/21   GreeWendie Agreste  lisinopril-hydrochlorothiazide (ZESTORETIC) 20-12.5 MG tablet Take 1 tablet by mouth once daily 12/18/21   GreeWendie Agreste  metFORMIN (GLUCOPHAGE) 1000 MG tablet Take 1 tablet (1,000 mg total) by mouth 2 (two) times daily with a meal. 07/15/21   GreeWendie Agreste  omeprazole (PRILOSEC) 20 MG capsule Take 1 capsule by mouth once daily 12/23/21   GreeWendie Agreste   Social History   Socioeconomic History   Marital status: Single    Spouse name: Not on file   Number of children: 2   Years of education: Not on file   Highest education level:  Not on file  Occupational History   Occupation: retired  Tobacco Use   Smoking status: Former    Types: Cigars    Quit date: 07/21/2010    Years since quitting: 11.5   Smokeless tobacco: Never  Vaping Use   Vaping Use: Never used  Substance and Sexual Activity   Alcohol use: No   Drug use: No   Sexual activity: Never  Other Topics Concern   Not on file  Social History Narrative   Lives alone - sons live in [place] Strain: [place]  (08/29/2021)   Overall Financial Resource Strain (CARDIA)    Difficulty of Paying Living Expenses: Not hard at all  Food Insecurity: No [place] (08/29/2021)   Hunger Vital Sign     Worried About Running Out of Food in the Last Year: Never true    [place] in the Last Year: Never true  Transportation Needs: No Transportation Needs (08/29/2021)   PRAPARE - Hydrologist (Medical): No    Lack of Transportation (Non-Medical): No  Physical Activity: Insufficiently Active (08/29/2021)   Exercise Vital Sign    Days of Exercise per Week: 4 days    Minutes of Exercise per Session: 30 min  Stress: No Stress Concern Present (08/29/2021)   [place]    Feeling of Stress : Not at all  Social Connections: Socially Isolated (08/29/2021)   Social Connection and Isolation Panel [NHANES]    Frequency of Communication with Friends and Family: More than three times a week    Frequency of Social Gatherings with Friends and Family: More than three times a week    Attends Religious Services: Never    Marine scientist or Organizations: No    Attends Archivist Meetings: Never    Marital Status: Divorced  Human resources officer Violence: Not At Risk (08/29/2021)   Humiliation, Afraid, Rape, and Kick questionnaire    Fear of Current or Ex-Partner: No    Emotionally Abused: No    Physically Abused: No    Sexually Abused: No    Review of Systems  Per HPI Objective:   Vitals:   01/30/22 1420  BP: 124/68  Pulse: 81  Resp: 17  Temp: (!) 97.5 F (36.4 C)  TempSrc: Oral  SpO2: 98%     Physical Exam Vitals reviewed.  Constitutional:      Appearance: He is well-developed.  HENT:     Head: Normocephalic and atraumatic.  Neck:     Vascular: No carotid bruit or JVD.  Cardiovascular:     Rate and Rhythm: Normal rate and regular rhythm.     Heart sounds: Normal heart sounds. No murmur heard. Pulmonary:     Effort: Pulmonary effort is normal.     Breath sounds: Normal breath sounds. No rales.  Chest:    Musculoskeletal:     Right lower leg: No edema.     Left lower leg:  No edema.  Skin:    General: Skin is warm and dry.  Neurological:     Mental Status: He is alert and oriented to person, place, and time.  Psychiatric:        Mood and Affect: Mood normal.        Assessment & Plan:  Gabriel Wu is a 81 y.o. male . Type 2 diabetes mellitus with microalbuminuria, without long-term current use  of insulin ([place]) - Plan: POCT glucose (manual entry)  -With recent diarrhea, improvement with low-dose metformin, will stay at 500 mg twice daily for now.  Has restarted Jardiance without recurrence of dizziness, advised to stop it again if the symptoms return.  Maintain hydration, RTC precautions.  Elevated serum creatinine - Plan: Basic metabolic panel  -Repeat BMP, maintain hydration.  We will need to watch GFR with use of metformin, and if he remains on Jardiance.  Episode of dizziness  -Resolved, RTC precautions given.  Maintain hydration, labs as above.  Diarrhea, unspecified type  -Improved as above.  Localized swelling of chest wall - Plan: DG Ribs Unilateral W/Chest Right  -Does not appear to be within breast tissue or skin, bony prominence noted on the right anterior chest wall without s pulmonary symptoms or discomfort.  We will start initially with rib series and recheck next few weeks.  Consider further imaging depending on x-ray results.  RTC precautions if new or worsening symptoms.  02/02/2022 addendum X-ray noted without concerns on ribs/bones.  Will check ultrasound, mammogram to rule out breast tissue as cause.  Meds ordered this encounter  Medications   metFORMIN (GLUCOPHAGE) 500 MG tablet    Sig: Take 1 tablet (500 mg total) by mouth 2 (two) times daily with a meal.    Dispense:  180 tablet    Refill:  1   Patient Instructions  Stay at 574m dose of metformin. If diarrhea continues, we may need to stop it.  Make sure to drink plenty of water.  I will recheck the kidney test today.  Ok to continue jardiance if we closely  monitor kidney function. If any return of dizziness, stop jardiance.  Recheck in next 3 weeks.  Return to the clinic or go to the nearest emergency room if any of your symptoms worsen or new symptoms occur.   I will order rib xrays for area on left chest and will discuss that further at next visit.  Return to the clinic or go to the nearest emergency room if any of your symptoms worsen or new symptoms occur.   [facility] for xray Walk in 8:30-4:30 during weekdays, no appointment needed [address]  [place] [id number]     Signed,   JMerri Ray MD [facility] 01/30/22 11:47 PM

## 2022-01-30 NOTE — Patient Instructions (Addendum)
Stay at 500mg  dose of metformin. If diarrhea continues, we may need to stop it.  Make sure to drink plenty of water.  I will recheck the kidney test today.  Ok to continue jardiance if we closely monitor kidney function. If any return of dizziness, stop jardiance.  Recheck in next 3 weeks.  Return to the clinic or go to the nearest emergency room if any of your symptoms worsen or new symptoms occur.   I will order rib xrays for area on left chest and will discuss that further at next visit.  Return to the clinic or go to the nearest emergency room if any of your symptoms worsen or new symptoms occur.  [facility] for xray Walk in 8:30-4:30 during weekdays, no appointment needed [address] .  [place], [place]

## 2022-01-31 ENCOUNTER — Ambulatory Visit (INDEPENDENT_AMBULATORY_CARE_PROVIDER_SITE_OTHER)
Admission: RE | Admit: 2022-01-31 | Discharge: 2022-01-31 | Disposition: A | Discharge status: Home / Self Care | Payer: Medicare HMO | Source: Ambulatory Visit | Attending: Family Medicine | Admitting: Family Medicine

## 2022-01-31 DIAGNOSIS — R0789 Other chest pain: Secondary | ICD-10-CM | POA: Diagnosis not present

## 2022-01-31 DIAGNOSIS — R222 Localized swelling, mass and lump, trunk: Secondary | ICD-10-CM | POA: Diagnosis not present

## 2022-01-31 LAB — BASIC METABOLIC PANEL
BUN: 30 mg/dL — ABNORMAL HIGH (ref 6–23)
CO2: 23 mEq/L (ref 19–32)
Calcium: 9.9 mg/dL (ref 8.4–10.5)
Chloride: 101 mEq/L (ref 96–112)
Creatinine, Ser: 1.92 mg/dL — ABNORMAL HIGH (ref 0.40–1.50)
GFR: 32.47 mL/min — ABNORMAL LOW (ref >60.00)
Glucose, Bld: 116 mg/dL — ABNORMAL HIGH (ref 70–99)
Potassium: 3.8 mEq/L (ref 3.5–5.1)
Sodium: 137 mEq/L (ref 135–145)

## 2022-02-02 NOTE — Addendum Note (Signed)
Addended by: Meredith Staggers R on: 02/02/2022 10:15 AM   Modules accepted: Orders

## 2022-02-03 NOTE — Progress Notes (Signed)
Spoke w/ pt and advised pt of findings. He has an apt for 02/24/22 for the [place] and mammogram

## 2022-02-08 ENCOUNTER — Other Ambulatory Visit: Payer: Self-pay | Admitting: Family Medicine

## 2022-02-08 DIAGNOSIS — R7989 Other specified abnormal findings of blood chemistry: Secondary | ICD-10-CM

## 2022-02-08 NOTE — Progress Notes (Signed)
See labs 

## 2022-02-10 ENCOUNTER — Other Ambulatory Visit: Payer: Self-pay

## 2022-02-10 DIAGNOSIS — R7989 Other specified abnormal findings of blood chemistry: Secondary | ICD-10-CM

## 2022-02-10 NOTE — Progress Notes (Signed)
Informed pt of lab results and he is coming in on Friday July  28 for lab only visit

## 2022-02-14 ENCOUNTER — Other Ambulatory Visit (INDEPENDENT_AMBULATORY_CARE_PROVIDER_SITE_OTHER): Payer: Medicare HMO

## 2022-02-14 DIAGNOSIS — IMO0002 Reserved for concepts with insufficient information to code with codable children: Secondary | ICD-10-CM

## 2022-02-14 DIAGNOSIS — R7989 Other specified abnormal findings of blood chemistry: Secondary | ICD-10-CM | POA: Diagnosis not present

## 2022-02-14 LAB — RENAL FUNCTION PANEL
Albumin: 4.6 g/dL (ref 3.5–5.2)
BUN: 24 mg/dL — ABNORMAL HIGH (ref 6–23)
CO2: 28 mEq/L (ref 19–32)
Calcium: 9.5 mg/dL (ref 8.4–10.5)
Chloride: 99 mEq/L (ref 96–112)
Creatinine, Ser: 1.7 mg/dL — ABNORMAL HIGH (ref 0.40–1.50)
GFR: 37.56 mL/min — ABNORMAL LOW (ref >60.00)
Glucose, Bld: 242 mg/dL — ABNORMAL HIGH (ref 70–99)
Phosphorus: 1.5 mg/dL — ABNORMAL LOW (ref 2.3–4.6)
Potassium: 3.5 mEq/L (ref 3.5–5.1)
Sodium: 134 mEq/L — ABNORMAL LOW (ref 135–145)

## 2022-02-14 LAB — BASIC METABOLIC PANEL
BUN: 24 mg/dL — ABNORMAL HIGH (ref 6–23)
CO2: 28 mEq/L (ref 19–32)
Calcium: 9.5 mg/dL (ref 8.4–10.5)
Chloride: 99 mEq/L (ref 96–112)
Creatinine, Ser: 1.7 mg/dL — ABNORMAL HIGH (ref 0.40–1.50)
GFR: 37.56 mL/min — ABNORMAL LOW (ref >60.00)
Glucose, Bld: 242 mg/dL — ABNORMAL HIGH (ref 70–99)
Potassium: 3.5 mEq/L (ref 3.5–5.1)
Sodium: 134 mEq/L — ABNORMAL LOW (ref 135–145)

## 2022-02-20 ENCOUNTER — Ambulatory Visit (INDEPENDENT_AMBULATORY_CARE_PROVIDER_SITE_OTHER): Payer: Medicare HMO | Admitting: Family Medicine

## 2022-02-20 VITALS — BP 126/64 | HR 87 | Temp 98.1°F | Resp 16 | Ht 70.0 in | Wt 185.4 lb

## 2022-02-20 DIAGNOSIS — R7989 Other specified abnormal findings of blood chemistry: Secondary | ICD-10-CM

## 2022-02-20 DIAGNOSIS — I10 Essential (primary) hypertension: Secondary | ICD-10-CM | POA: Diagnosis not present

## 2022-02-20 DIAGNOSIS — E1129 Type 2 diabetes mellitus with other diabetic kidney complication: Secondary | ICD-10-CM | POA: Diagnosis not present

## 2022-02-20 DIAGNOSIS — R809 Proteinuria, unspecified: Secondary | ICD-10-CM

## 2022-02-20 DIAGNOSIS — R222 Localized swelling, mass and lump, trunk: Secondary | ICD-10-CM | POA: Diagnosis not present

## 2022-02-20 LAB — GLUCOSE, POCT (MANUAL RESULT ENTRY): POC Glucose: 112 mg/dl — AB (ref 70–99)

## 2022-02-20 NOTE — Progress Notes (Signed)
Subjective:  Patient ID: Gabriel Wu, male    DOB: 1941/07/20  Age: 81 y.o. MRN: [id number]  CC:  Chief Complaint  Patient presents with   Results    Discuss xray recheck kidney function    Diabetes    118, 137, 160, 147, 126, 173, 181 readings the last week     HPI Gabriel Wu presents for   Diabetes: Recent diarrhea discussed at last visit and improvement in readings remained at 500 mg metformin dosing twice daily, he was able to restart Jardiance without recurrence of dizziness.  Variable readings as above from 188-181.  No symptomatic lows. No recent readings in 200s.  No diarrhea in past few weeks, soft stool once per day.  Lab Results  Component Value Date   HGBA1C 7.3 (H) 01/01/2022   HGBA1C 7.6 (A) 09/30/2021   HGBA1C 7.8 (H) 07/01/2021   Lab Results  Component Value Date   MICROALBUR 5.2 (H) 12/03/2020   LDLCALC 32 12/03/2020   CREATININE 1.70 (H) 02/14/2022   CREATININE 1.70 (H) 02/14/2022   Elevated creatinine: Some elevated readings recently, 1.70 on June 14, down to 1.58 June 29, 1.92 on July 13, has returned to 1.7 July 28 with EGFR 37.5.  Creat in 1.02-1.37 range through 06/2021.  Glucose 242 on recnet BMP on 7/28.  No recent nsaids. Light yellow urine, drinking water all day - unknown quantity. Urinating normally - no hesitancy or incomplete emptying.  Lab Results  Component Value Date   CREATININE 1.70 (H) 02/14/2022   CREATININE 1.70 (H) 02/14/2022  Home BP 126/71. No recent low readings, no n/v/abd pain.  BP Readings from Last 3 Encounters:  02/20/22 126/64  01/30/22 124/68  01/01/22 122/74     Right anterior chest wall prominence Discussed at July 13 visit.  Noticed a prominent area when he was showering week prior, no skin changes or pain. X-ray was reassuring July 14th: FINDINGS: Stable cardiac and mediastinal contours. No consolidative pulmonary opacities. No pleural effusion or pneumothorax. No marker is placed over the  area of concern. The visualized aspect of the right ribs are unremarkable.  IMPRESSION: No acute cardiopulmonary process. Appointment Monday for US/MMG.   History Patient Active Problem List   Diagnosis Date Noted   DM (diabetes mellitus) ([place]) 05/12/2016   Hyperlipemia    Chest pain 04/12/2016   Essential hypertension 04/12/2016   Dyspepsia 04/12/2016   Past Medical History:  Diagnosis Date   Hypertension    Urolithiasis    Past stone spontaneously per patient.   No past surgical history on file. No Known Allergies Prior to Admission medications   Medication Sig Start Date End Date Taking? Authorizing Provider  aspirin 81 MG EC tablet Take 1 tablet (81 mg total) by mouth daily. 08/29/19  Yes Wendie Agreste, MD  atorvastatin (LIPITOR) 40 MG tablet TAKE 1 TABLET BY MOUTH ONCE DAILY AT 6PM. 12/02/21  Yes Wendie Agreste, MD  blood glucose meter kit and supplies Dispense based on patient and insurance preference. Use once per day and if symptoms of low blood sugar. (FOR ICD-10 E10.9, E11.9). 01/01/22  Yes Wendie Agreste, MD  fluticasone (FLONASE) 50 MCG/ACT nasal spray Place 2 sprays into both nostrils daily. 09/30/21  Yes Wendie Agreste, MD  lisinopril-hydrochlorothiazide (ZESTORETIC) 20-12.5 MG tablet Take 1 tablet by mouth once daily 12/18/21  Yes Wendie Agreste, MD  metFORMIN (GLUCOPHAGE) 500 MG tablet Take 1 tablet (500 mg total) by mouth 2 (two) times daily with a  meal. 01/30/22  Yes Wendie Agreste, MD  omeprazole (PRILOSEC) 20 MG capsule Take 1 capsule by mouth once daily 12/23/21  Yes Wendie Agreste, MD   Social History   Socioeconomic History   Marital status: Single    Spouse name: Not on file   Number of children: 2   Years of education: Not on file   Highest education level: Not on file  Occupational History   Occupation: retired  Tobacco Use   Smoking status: Former    Types: Cigars    Quit date: 07/21/2010    Years since quitting: 11.5   Smokeless  tobacco: Never  Vaping Use   Vaping Use: Never used  Substance and Sexual Activity   Alcohol use: No   Drug use: No   Sexual activity: Never  Other Topics Concern   Not on file  Social History Narrative   Lives alone - sons live in [place] Strain: Gabriel Wu  (08/29/2021)   Overall Financial Resource Strain (CARDIA)    Difficulty of Paying Living Expenses: Not hard at all  Food Insecurity: No [place] (08/29/2021)   Hunger Vital Sign    Worried About Running Out of Food in the Last Year: Never true    [place] in the Last Year: Never true  Transportation Needs: No Transportation Needs (08/29/2021)   PRAPARE - Hydrologist (Medical): No    Lack of Transportation (Non-Medical): No  Physical Activity: Insufficiently Active (08/29/2021)   Exercise Vital Sign    Days of Exercise per Week: 4 days    Minutes of Exercise per Session: 30 min  Stress: No Stress Concern Present (08/29/2021)   [place]    Feeling of Stress : Not at all  Social Connections: Socially Isolated (08/29/2021)   Social Connection and Isolation Panel [NHANES]    Frequency of Communication with Friends and Family: More than three times a week    Frequency of Social Gatherings with Friends and Family: More than three times a week    Attends Religious Services: Never    Marine scientist or Organizations: No    Attends Archivist Meetings: Never    Marital Status: Divorced  Human resources officer Violence: Not At Risk (08/29/2021)   Humiliation, Afraid, Rape, and Kick questionnaire    Fear of Current or Ex-Partner: No    Emotionally Abused: No    Physically Abused: No    Sexually Abused: No    Review of Systems  Constitutional:  Negative for fatigue and unexpected weight change.  Eyes:  Negative for visual disturbance.  Respiratory:  Negative for  cough, chest tightness and shortness of breath.   Cardiovascular:  Negative for chest pain, palpitations and leg swelling.  Gastrointestinal:  Negative for abdominal pain and blood in stool.  Neurological:  Negative for dizziness, light-headedness and headaches.     Objective:   Vitals:   02/20/22 1343  BP: 126/64  Pulse: 87  Resp: 16  Temp: 98.1 F (36.7 C)  TempSrc: Oral  SpO2: 97%  Weight: 185 lb 6.4 oz (84.1 kg)  Height: 5' 10"  (1.778 m)     Physical Exam Vitals reviewed.  Constitutional:      Appearance: He is well-developed.  HENT:     Head: Normocephalic and atraumatic.  Neck:     Vascular: No carotid bruit or JVD.  Cardiovascular:     Rate and Rhythm: Normal rate and regular rhythm.     Heart sounds: Normal heart sounds. No murmur heard. Pulmonary:     Effort: Pulmonary effort is normal.     Breath sounds: Normal breath sounds. No rales.     Comments: Right upper anterior chest wall with firm prominence, mid chest on right.  Nontender.  No skin changes. Musculoskeletal:     Right lower leg: No edema.     Left lower leg: No edema.  Skin:    General: Skin is warm and dry.  Neurological:     Mental Status: He is alert and oriented to person, place, and time.  Psychiatric:        Mood and Affect: Mood normal.    Results for orders placed or performed in visit on 02/20/22  POCT glucose (manual entry)  Result Value Ref Range   POC Glucose 112 (A) 70 - 99 mg/dl    Assessment & Plan:  Imanuel Pruiett is a 81 y.o. male . Increase in creatinine - Plan: Ambulatory referral to Nephrology, Basic metabolic panel  -Increased creatinine past 6 to 8 months but recent levels were overall stable.  Potentially could have had episodes of hypovolemia, prerenal azotemia.  Repeat levels in 1 week, maintain hydration.  He will check into the pain reliever he has been using at home to make sure that is not an NSAID.  We will need to watch GFR closely to decide if metformin  needs to be held as well as SGLT2.  No changes for now.  Refer to nephrology.  Type 2 diabetes mellitus with microalbuminuria, without long-term current use of insulin ([place]) - Plan: Ambulatory referral to Nephrology, POCT glucose (manual entry)  -Variable readings, no med changes for now.  Essential hypertension  -Stable on current regimen without hypotensive symptoms.  Continue Zestoretic, but again with increased creatinine we will need to monitor with use of ACE inhibitor.  Localized swelling of chest wall  -Ultrasound, mammogram pending.  May need CT or other imaging depending on those results.  Initial chest x-ray reassuring.  No orders of the defined types were placed in this encounter.  Patient Instructions  We may need to change your diabetes meds if kidney test worsens. Make sure to drink plenty of fluids throughout the day. I will refer you to kidney specialist, but repeat labs next week.      Signed,   Merri Ray, MD [place] 02/21/22 3:43 PM

## 2022-02-20 NOTE — Patient Instructions (Addendum)
We may need to change your diabetes meds if kidney test worsens. Make sure to drink plenty of fluids throughout the day. I will refer you to kidney specialist, but repeat labs next week.

## 2022-02-21 ENCOUNTER — Encounter: Payer: Self-pay | Admitting: Family Medicine

## 2022-02-21 ENCOUNTER — Other Ambulatory Visit (INDEPENDENT_AMBULATORY_CARE_PROVIDER_SITE_OTHER): Payer: Medicare HMO

## 2022-02-21 DIAGNOSIS — R7989 Other specified abnormal findings of blood chemistry: Secondary | ICD-10-CM | POA: Diagnosis not present

## 2022-02-21 LAB — BASIC METABOLIC PANEL
BUN: 20 mg/dL (ref 6–23)
CO2: 30 mEq/L (ref 19–32)
Calcium: 9.9 mg/dL (ref 8.4–10.5)
Chloride: 101 mEq/L (ref 96–112)
Creatinine, Ser: 1.56 mg/dL — ABNORMAL HIGH (ref 0.40–1.50)
GFR: 41.63 mL/min — ABNORMAL LOW (ref >60.00)
Glucose, Bld: 156 mg/dL — ABNORMAL HIGH (ref 70–99)
Potassium: 3.8 mEq/L (ref 3.5–5.1)
Sodium: 138 mEq/L (ref 135–145)

## 2022-02-24 ENCOUNTER — Ambulatory Visit
Admission: RE | Admit: 2022-02-24 | Discharge: 2022-02-24 | Disposition: A | Discharge status: Home / Self Care | Payer: Medicare HMO | Source: Ambulatory Visit | Attending: Family Medicine | Admitting: Family Medicine

## 2022-02-24 DIAGNOSIS — R222 Localized swelling, mass and lump, trunk: Secondary | ICD-10-CM

## 2022-02-24 DIAGNOSIS — R928 Other abnormal and inconclusive findings on diagnostic imaging of breast: Secondary | ICD-10-CM | POA: Diagnosis not present

## 2022-02-24 DIAGNOSIS — N6310 Unspecified lump in the right breast, unspecified quadrant: Secondary | ICD-10-CM | POA: Diagnosis not present

## 2022-02-25 ENCOUNTER — Other Ambulatory Visit: Payer: Self-pay | Admitting: Family Medicine

## 2022-02-25 DIAGNOSIS — M899 Disorder of bone, unspecified: Secondary | ICD-10-CM

## 2022-02-25 DIAGNOSIS — R222 Localized swelling, mass and lump, trunk: Secondary | ICD-10-CM

## 2022-02-25 NOTE — Progress Notes (Signed)
Spoke to the pt and advised him of the results  and CT scan was ordered . He as agreeable to that

## 2022-02-25 NOTE — Progress Notes (Signed)
See chest x-ray, mammogram/ultrasound results.  Prominent rib versus rib mass on the right side.  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  Lab Results  Component Value Date   CREATININE 1.56 (H) 02/21/2022

## 2022-03-10 ENCOUNTER — Other Ambulatory Visit: Payer: Self-pay | Admitting: Family Medicine

## 2022-03-10 DIAGNOSIS — E785 Hyperlipidemia, unspecified: Secondary | ICD-10-CM

## 2022-03-17 ENCOUNTER — Other Ambulatory Visit: Payer: Self-pay | Admitting: Family Medicine

## 2022-03-17 DIAGNOSIS — I10 Essential (primary) hypertension: Secondary | ICD-10-CM

## 2022-03-19 ENCOUNTER — Encounter: Payer: Self-pay | Admitting: Family Medicine

## 2022-03-19 ENCOUNTER — Ambulatory Visit (INDEPENDENT_AMBULATORY_CARE_PROVIDER_SITE_OTHER): Payer: Medicare HMO | Admitting: Family Medicine

## 2022-03-19 VITALS — BP 122/74 | HR 74 | Temp 98.1°F | Ht 70.0 in | Wt 185.2 lb

## 2022-03-19 DIAGNOSIS — R7989 Other specified abnormal findings of blood chemistry: Secondary | ICD-10-CM | POA: Diagnosis not present

## 2022-03-19 DIAGNOSIS — E1129 Type 2 diabetes mellitus with other diabetic kidney complication: Secondary | ICD-10-CM

## 2022-03-19 DIAGNOSIS — R809 Proteinuria, unspecified: Secondary | ICD-10-CM

## 2022-03-19 DIAGNOSIS — I10 Essential (primary) hypertension: Secondary | ICD-10-CM | POA: Diagnosis not present

## 2022-03-19 DIAGNOSIS — E785 Hyperlipidemia, unspecified: Secondary | ICD-10-CM

## 2022-03-19 NOTE — Patient Instructions (Signed)
No change in medications today.  If you have any low blood pressures, let me know right away.  Stay hydrated.  Avoid any pain medicines over-the-counter except for acetaminophen.  Let me know if you do not hear from the kidney specialist in the next few weeks.  Recheck in 1 month for repeat diabetes testing but let me know if you have any low or high blood sugars compared to your current range.  Take care.   Return to the clinic or go to the nearest emergency room if any of your symptoms worsen or new symptoms occur.

## 2022-03-19 NOTE — Progress Notes (Signed)
Subjective:  Patient ID: Gabriel Collar Sr., male    DOB: 1941-02-09  Age: 81 y.o. MRN: [id number]  CC:  Chief Complaint  Patient presents with   Diabetes   Hypertension   Hyperlipidemia    Pt states all is well    HPI Gabriel Heffern Heeren Sr. presents for   Diabetes: With microalbuminuria.  Some increased creatinine past 6 to 8 months noted at his August 3 visit.  Possible episodes of hypovolemia, prerenal azotemia.  Also advised to avoid any use of NSAIDs.  Monitoring to determine if metformin needs to be held as well as SGLT2.  Continue same regimen at his August 3 visit and referred to nephrology. Creatinine 1.70 on July 28, decreased to 1.56 on August 4. Taking acetaminophen for HA on occasion- every few weeks, no nsaids.  Has been drinking more fluids.  Home blood sugar 114 today. No lows. No 200's. Remains in low 100's.  Has not heard from nephrology yet.  No diarrhea. Taking metformin BID, ran out of jardiance - finished and has not yet refilled. Lab Results  Component Value Date   HGBA1C 7.3 (H) 01/01/2022   HGBA1C 7.6 (A) 09/30/2021   HGBA1C 7.8 (H) 07/01/2021   Lab Results  Component Value Date   MICROALBUR 5.2 (H) 12/03/2020   LDLCALC 32 12/03/2020   CREATININE 1.56 (H) 02/21/2022   BP Readings from Last 3 Encounters:  03/19/22 122/74  02/20/22 126/64  01/30/22 124/68  Home BP 122/71, no low readings. Still on ACE-I with combo. No new side effects.    History Patient Active Problem List   Diagnosis Date Noted   DM (diabetes mellitus) ([place]) 05/12/2016   Hyperlipemia    Chest pain 04/12/2016   Essential hypertension 04/12/2016   Dyspepsia 04/12/2016   Past Medical History:  Diagnosis Date   Hypertension    Urolithiasis    Past stone spontaneously per patient.   No past surgical history on file. No Known Allergies Prior to Admission medications   Medication Sig Start Date End Date Taking? Authorizing Provider  aspirin 81 MG EC tablet Take  1 tablet (81 mg total) by mouth daily. 08/29/19  Yes Wendie Agreste, MD  atorvastatin (LIPITOR) 40 MG tablet TAKE 1 TABLET BY MOUTH ONCE DAILY AT  6  PM. 03/10/22  Yes Wendie Agreste, MD  blood glucose meter kit and supplies Dispense based on patient and insurance preference. Use once per day and if symptoms of low blood sugar. (FOR ICD-10 E10.9, E11.9). 01/01/22  Yes Wendie Agreste, MD  fluticasone (FLONASE) 50 MCG/ACT nasal spray Place 2 sprays into both nostrils daily. 09/30/21  Yes Wendie Agreste, MD  lisinopril-hydrochlorothiazide (ZESTORETIC) 20-12.5 MG tablet Take 1 tablet by mouth once daily 03/17/22  Yes Wendie Agreste, MD  metFORMIN (GLUCOPHAGE) 500 MG tablet Take 1 tablet (500 mg total) by mouth 2 (two) times daily with a meal. 01/30/22  Yes Wendie Agreste, MD  omeprazole (PRILOSEC) 20 MG capsule Take 1 capsule by mouth once daily 12/23/21  Yes Wendie Agreste, MD   Social History   Socioeconomic History   Marital status: Single    Spouse name: Not on file   Number of children: 2   Years of education: Not on file   Highest education level: Not on file  Occupational History   Occupation: retired  Tobacco Use   Smoking status: Former    Types: Cigars    Quit date: 07/21/2010  Years since quitting: 11.6   Smokeless tobacco: Never  Vaping Use   Vaping Use: Never used  Substance and Sexual Activity   Alcohol use: No   Drug use: No   Sexual activity: Never  Other Topics Concern   Not on file  Social History Narrative   Lives alone - sons live in [place] Strain: Low Risk  (08/29/2021)   Overall Financial Resource Strain (CARDIA)    Difficulty of Paying Living Expenses: Not hard at all  Food Insecurity: No Food Insecurity (08/29/2021)   Hunger Vital Sign    Worried About Running Out of Food in the Last Year: Never true    Ran Out of Food in the Last Year: Never true  Transportation Needs: No Transportation  Needs (08/29/2021)   PRAPARE - Hydrologist (Medical): No    Lack of Transportation (Non-Medical): No  Physical Activity: Insufficiently Active (08/29/2021)   Exercise Vital Sign    Days of Exercise per Week: 4 days    Minutes of Exercise per Session: 30 min  Stress: No Stress Concern Present (08/29/2021)   [place]    Feeling of Stress : Not at all  Social Connections: Socially Isolated (08/29/2021)   Social Connection and Isolation Panel [NHANES]    Frequency of Communication with Friends and Family: More than three times a week    Frequency of Social Gatherings with Friends and Family: More than three times a week    Attends Religious Services: Never    Marine scientist or Organizations: No    Attends Archivist Meetings: Never    Marital Status: Divorced  Human resources officer Violence: Not At Risk (08/29/2021)   Humiliation, Afraid, Rape, and Kick questionnaire    Fear of Current or Ex-Partner: No    Emotionally Abused: No    Physically Abused: No    Sexually Abused: No    Review of Systems   Objective:   Vitals:   03/19/22 1537  BP: 122/74  Pulse: 74  Temp: 98.1 F (36.7 C)  SpO2: 99%  Weight: 185 lb 3.2 oz (84 kg)  Height: 5' 10"  (1.778 m)     Physical Exam Vitals reviewed.  Constitutional:      Appearance: He is well-developed.  HENT:     Head: Normocephalic and atraumatic.  Neck:     Vascular: No carotid bruit or JVD.  Cardiovascular:     Rate and Rhythm: Normal rate and regular rhythm.     Heart sounds: Normal heart sounds. No murmur heard. Pulmonary:     Effort: Pulmonary effort is normal.     Breath sounds: Normal breath sounds. No rales.  Musculoskeletal:     Right lower leg: No edema.     Left lower leg: No edema.  Skin:    General: Skin is warm and dry.  Neurological:     Mental Status: He is alert and oriented to person, place, and time.   Psychiatric:        Mood and Affect: Mood normal.        Assessment & Plan:  Gabriel PROCH Sr. is a 81 y.o. male . Type 2 diabetes mellitus with microalbuminuria, without long-term current use of insulin (HCC) - Plan: Basic metabolic panel  Essential hypertension - Plan: Basic metabolic panel  Increase in creatinine - Plan: Basic metabolic panel Continue to avoid  NSAIDs, maintain hydration, repeat creatinine.  No change in ACE inhibitor, metformin or GLP at this time unless creatinine worsens.  Recheck in 1 month for repeat A1c, nephrology referral pending.  RTC/ER precautions given.  No orders of the defined types were placed in this encounter.  Patient Instructions  No change in medications today.  If you have any low blood pressures, let me know right away.  Stay hydrated.  Avoid any pain medicines over-the-counter except for acetaminophen.  Let me know if you do not hear from the kidney specialist in the next few weeks.  Recheck in 1 month for repeat diabetes testing but let me know if you have any low or high blood sugars compared to your current range.  Take care.   Return to the clinic or go to the nearest emergency room if any of your symptoms worsen or new symptoms occur.     Signed,   Merri Ray, MD [place] 03/19/22 5:03 PM

## 2022-03-20 LAB — BASIC METABOLIC PANEL
BUN: 19 mg/dL (ref 6–23)
CO2: 28 mEq/L (ref 19–32)
Calcium: 9.1 mg/dL (ref 8.4–10.5)
Chloride: 100 mEq/L (ref 96–112)
Creatinine, Ser: 1.43 mg/dL (ref 0.40–1.50)
GFR: 46.19 mL/min — ABNORMAL LOW (ref >60.00)
Glucose, Bld: 109 mg/dL — ABNORMAL HIGH (ref 70–99)
Potassium: 3.8 mEq/L (ref 3.5–5.1)
Sodium: 138 mEq/L (ref 135–145)

## 2022-03-25 ENCOUNTER — Other Ambulatory Visit: Payer: Self-pay | Admitting: Family Medicine

## 2022-03-25 DIAGNOSIS — K219 Gastro-esophageal reflux disease without esophagitis: Secondary | ICD-10-CM

## 2022-03-27 ENCOUNTER — Other Ambulatory Visit: Payer: Medicare HMO

## 2022-04-21 ENCOUNTER — Encounter: Payer: Self-pay | Admitting: Family Medicine

## 2022-04-21 ENCOUNTER — Ambulatory Visit (INDEPENDENT_AMBULATORY_CARE_PROVIDER_SITE_OTHER): Payer: Medicare HMO | Admitting: Family Medicine

## 2022-04-21 VITALS — BP 118/62 | HR 88 | Temp 98.1°F | Ht 70.0 in | Wt 182.6 lb

## 2022-04-21 DIAGNOSIS — E1129 Type 2 diabetes mellitus with other diabetic kidney complication: Secondary | ICD-10-CM

## 2022-04-21 DIAGNOSIS — R809 Proteinuria, unspecified: Secondary | ICD-10-CM

## 2022-04-21 DIAGNOSIS — R351 Nocturia: Secondary | ICD-10-CM | POA: Diagnosis not present

## 2022-04-21 DIAGNOSIS — I10 Essential (primary) hypertension: Secondary | ICD-10-CM

## 2022-04-21 DIAGNOSIS — E785 Hyperlipidemia, unspecified: Secondary | ICD-10-CM

## 2022-04-21 DIAGNOSIS — R0982 Postnasal drip: Secondary | ICD-10-CM

## 2022-04-21 DIAGNOSIS — Z23 Encounter for immunization: Secondary | ICD-10-CM | POA: Diagnosis not present

## 2022-04-21 MED ORDER — METFORMIN HCL 500 MG PO TABS: 500.0000 mg | ORAL_TABLET | Freq: Two times a day (BID) | ORAL | 2 refills | Status: DC

## 2022-04-21 MED ORDER — LISINOPRIL-HYDROCHLOROTHIAZIDE 20-12.5 MG PO TABS: 1.0000 | ORAL_TABLET | Freq: Every day | ORAL | 2 refills | Status: DC

## 2022-04-21 MED ORDER — EMPAGLIFLOZIN 10 MG PO TABS: 10.0000 mg | ORAL_TABLET | Freq: Every day | ORAL | 2 refills | Status: DC

## 2022-04-21 MED ORDER — FLUTICASONE PROPIONATE 50 MCG/ACT NA SUSP: 2.0000 | Freq: Every day | NASAL | 6 refills | Status: AC

## 2022-04-21 MED ORDER — ATORVASTATIN CALCIUM 40 MG PO TABS
ORAL_TABLET | ORAL | 2 refills | Status: DC
Start: 2022-04-21 — End: 2022-10-23

## 2022-04-21 NOTE — Patient Instructions (Addendum)
Try to stop drinking fluids 1 hour before bedtime, but make sure you drink sufficient fluids throughout the rest of the day.  If continued difficulty with waking up in the melanite, follow-up with me to discuss other treatments or testing.  No other medication changes today.  Recheck in 3 months but let me know if there are questions.  Take care.  Please call Dr. Kathlen Mody for appointment: [phone]

## 2022-04-21 NOTE — Progress Notes (Signed)
Subjective:  Patient ID: Gabriel Collar Sr., male    DOB: 02-Oct-1940  Age: 81 y.o. MRN: [id number]  CC:  Chief Complaint  Patient presents with   Diabetes    Pt states all is well   Hypertension    HPI Gabriel Pinzon Toral Sr. presents for   Diabetes: With microalbuminuria, elevated creatinine past 6 to 8 months.  Thought to be possible component of hypovolemia/prerenal azotemia.  Avoidance of NSAIDs discussed, closely monitoring to decide if metformin/SGLT2 need to be held.  Creatinine was improved at his August 30 visit down from 1.70 in July, 1.56 August 4 he was increasing fluid intake at that time.  Referred to nephrology previously. He is on ACE inhibitor and statin. Jardiance 10 mg, metformin 500 mg twice daily for diabetes Home readings: 190 last night, 160 today, usually 150-160, no symptomatic lows Microalbumin:5.2 on 11/2020. Ordered.  Optho, foot exam, pneumovax:  Dr. Kathlen Mody - overdue for appt - needs phone number. Has not seen this year.   Lab Results  Component Value Date   HGBA1C 7.3 (H) 01/01/2022   HGBA1C 7.6 (A) 09/30/2021   HGBA1C 7.8 (H) 07/01/2021   Lab Results  Component Value Date   MICROALBUR 5.2 (H) 12/03/2020   LDLCALC 32 12/03/2020   CREATININE 1.43 03/19/2022    Hyperlipidemia: Lipitor 40 mg daily, no new myalgia/side effects.  Lab Results  Component Value Date   CHOL 108 07/08/2021   HDL 38.70 (L) 07/08/2021   LDLCALC 32 12/03/2020   LDLDIRECT 48.0 07/08/2021   TRIG 216.0 (H) 07/08/2021   CHOLHDL 3 07/08/2021   Lab Results  Component Value Date   ALT 12 07/08/2021   AST 14 07/08/2021   ALKPHOS 67 07/08/2021   BILITOT 0.4 07/08/2021   Hypertension: Lisinopril HCTZ 20/12.5 mg daily. Home readings: 127/71.  No new side effects. BP Readings from Last 3 Encounters:  04/21/22 118/62  03/19/22 122/74  02/20/22 126/64   Lab Results  Component Value Date   CREATININE 1.43 03/19/2022   Nocturia Wakes up once at night.  No  difficulty getting to sleep, wakes up once to urinate in the middle the night.  Does drink sufficient fluids throughout the day including into the evening.   History Patient Active Problem List   Diagnosis Date Noted   DM (diabetes mellitus) ([place]) 05/12/2016   Hyperlipemia    Chest pain 04/12/2016   Essential hypertension 04/12/2016   Dyspepsia 04/12/2016   Past Medical History:  Diagnosis Date   Hypertension    Urolithiasis    Past stone spontaneously per patient.   No past surgical history on file. No Known Allergies Prior to Admission medications   Medication Sig Start Date End Date Taking? Authorizing Provider  aspirin 81 MG EC tablet Take 1 tablet (81 mg total) by mouth daily. 08/29/19  Yes Wendie Agreste, MD  atorvastatin (LIPITOR) 40 MG tablet TAKE 1 TABLET BY MOUTH ONCE DAILY AT  6  PM. 03/10/22  Yes Wendie Agreste, MD  blood glucose meter kit and supplies Dispense based on patient and insurance preference. Use once per day and if symptoms of low blood sugar. (FOR ICD-10 E10.9, E11.9). 01/01/22  Yes Wendie Agreste, MD  empagliflozin (JARDIANCE) 10 MG TABS tablet Take by mouth daily.   Yes [provider]  fluticasone (FLONASE) 50 MCG/ACT nasal spray Place 2 sprays into both nostrils daily. 09/30/21  Yes Wendie Agreste, MD  lisinopril-hydrochlorothiazide (ZESTORETIC) 20-12.5 MG tablet Take 1 tablet  by mouth once daily 03/17/22  Yes Wendie Agreste, MD  metFORMIN (GLUCOPHAGE) 500 MG tablet Take 1 tablet (500 mg total) by mouth 2 (two) times daily with a meal. 01/30/22  Yes Wendie Agreste, MD  omeprazole (PRILOSEC) 20 MG capsule Take 1 capsule by mouth once daily 03/25/22  Yes Wendie Agreste, MD   Social History   Socioeconomic History   Marital status: Single    Spouse name: Not on file   Number of children: 2   Years of education: Not on file   Highest education level: Not on file  Occupational History   Occupation: retired  Tobacco Use   Smoking  status: Former    Types: Cigars    Quit date: 07/21/2010    Years since quitting: 11.7   Smokeless tobacco: Never  Vaping Use   Vaping Use: Never used  Substance and Sexual Activity   Alcohol use: No   Drug use: No   Sexual activity: Never  Other Topics Concern   Not on file  Social History Narrative   Lives alone - sons live in [place] Strain: [place]  (08/29/2021)   Overall Financial Resource Strain (CARDIA)    Difficulty of Paying Living Expenses: Not hard at all  Food Insecurity: No [place] (08/29/2021)   Hunger Vital Sign    Worried About Running Out of Food in the Last Year: Never true    [place] in the Last Year: Never true  Transportation Needs: No Transportation Needs (08/29/2021)   PRAPARE - Hydrologist (Medical): No    Lack of Transportation (Non-Medical): No  Physical Activity: Insufficiently Active (08/29/2021)   Exercise Vital Sign    Days of Exercise per Week: 4 days    Minutes of Exercise per Session: 30 min  Stress: No Stress Concern Present (08/29/2021)   [place]    Feeling of Stress : Not at all  Social Connections: Socially Isolated (08/29/2021)   Social Connection and Isolation Panel [NHANES]    Frequency of Communication with Friends and Family: More than three times a week    Frequency of Social Gatherings with Friends and Family: More than three times a week    Attends Religious Services: Never    Marine scientist or Organizations: No    Attends Archivist Meetings: Never    Marital Status: Divorced  Human resources officer Violence: Not At Risk (08/29/2021)   Humiliation, Afraid, Rape, and Kick questionnaire    Fear of Current or Ex-Partner: No    Emotionally Abused: No    Physically Abused: No    Sexually Abused: No    Review of Systems  Constitutional:  Negative for fatigue  and unexpected weight change.  Eyes:  Negative for visual disturbance.  Respiratory:  Negative for cough, chest tightness and shortness of breath.   Cardiovascular:  Negative for chest pain, palpitations and leg swelling.  Gastrointestinal:  Negative for abdominal pain and blood in stool.  Neurological:  Negative for dizziness, light-headedness and headaches.     Objective:   Vitals:   04/21/22 1345  BP: 118/62  Pulse: 88  Temp: 98.1 F (36.7 C)  SpO2: 97%  Weight: 182 lb 9.6 oz (82.8 kg)  Height: 5' 10"  (1.778 m)     Physical Exam Vitals reviewed.  Constitutional:      Appearance:  He is well-developed.  HENT:     Head: Normocephalic and atraumatic.  Neck:     Vascular: No carotid bruit or JVD.  Cardiovascular:     Rate and Rhythm: Normal rate and regular rhythm.     Heart sounds: Normal heart sounds. No murmur heard. Pulmonary:     Effort: Pulmonary effort is normal.     Breath sounds: Normal breath sounds. No rales.  Musculoskeletal:     Right lower leg: No edema.     Left lower leg: No edema.  Skin:    General: Skin is warm and dry.  Neurological:     Mental Status: He is alert and oriented to person, place, and time.  Psychiatric:        Mood and Affect: Mood normal.        Assessment & Plan:  Gabriel ROUDEBUSH Sr. is a 81 y.o. male . Type 2 diabetes mellitus with microalbuminuria, without long-term current use of insulin (HCC) - Plan: empagliflozin (JARDIANCE) 10 MG TABS tablet, metFORMIN (GLUCOPHAGE) 500 MG tablet, Hemoglobin A1c, Microalbumin / creatinine urine ratio  -  Stable, tolerating current regimen. Medications refilled. Labs pending as above.  Follow-up with eye specialist for diabetic retinopathy screening.  Number provided.  Need for influenza vaccination - Plan: Flu Vaccine QUAD High Dose(Fluad)  Hyperlipidemia, unspecified hyperlipidemia type - Plan: atorvastatin (LIPITOR) 40 MG tablet, Comprehensive metabolic panel, Lipid panel  -   Stable, tolerating current regimen. Medications refilled. Labs pending as above.   Postnasal drip - Plan: fluticasone (FLONASE) 50 MCG/ACT nasal spray  -  Stable, tolerating current regimen. Medications refilled.   Essential hypertension - Plan: lisinopril-hydrochlorothiazide (ZESTORETIC) 20-12.5 MG tablet  -  Stable, tolerating current regimen. Medications refilled. Labs pending as above.   Nocturia  -Possibly related to fluid intake prior to bedtime.  Fluid restriction 1 hour prior to bedtime but maintenance of hydration during the day discussed especially with previous elevated creatinine.  RTC precautions if symptoms persist.  Meds ordered this encounter  Medications   atorvastatin (LIPITOR) 40 MG tablet    Sig: TAKE 1 TABLET BY MOUTH ONCE DAILY AT  6  PM.    Dispense:  90 tablet    Refill:  2   empagliflozin (JARDIANCE) 10 MG TABS tablet    Sig: Take 1 tablet (10 mg total) by mouth daily.    Dispense:  90 tablet    Refill:  2   fluticasone (FLONASE) 50 MCG/ACT nasal spray    Sig: Place 2 sprays into both nostrils daily.    Dispense:  16 g    Refill:  6   lisinopril-hydrochlorothiazide (ZESTORETIC) 20-12.5 MG tablet    Sig: Take 1 tablet by mouth daily.    Dispense:  90 tablet    Refill:  2   metFORMIN (GLUCOPHAGE) 500 MG tablet    Sig: Take 1 tablet (500 mg total) by mouth 2 (two) times daily with a meal.    Dispense:  180 tablet    Refill:  2   Patient Instructions  Try to stop drinking fluids 1 hour before bedtime, but make sure you drink sufficient fluids throughout the rest of the day.  If continued difficulty with waking up in the melanite, follow-up with me to discuss other treatments or testing.  No other medication changes today.  Recheck in 3 months but let me know if there are questions.  Take care.  Please call Dr. Kathlen Mody for appointment: [phone]     Signed,  Merri Ray, MD [facility] 04/21/22 2:09 PM

## 2022-04-22 LAB — MICROALBUMIN / CREATININE URINE RATIO
Creatinine,U: 63.7 mg/dL
Microalb Creat Ratio: 2 mg/g (ref 0.0–30.0)
Microalb, Ur: 1.3 mg/dL (ref 0.0–1.9)

## 2022-04-22 LAB — COMPREHENSIVE METABOLIC PANEL
ALT: 15 U/L (ref 0–53)
AST: 16 U/L (ref 0–37)
Albumin: 4.5 g/dL (ref 3.5–5.2)
Alkaline Phosphatase: 78 U/L (ref 39–117)
BUN: 28 mg/dL — ABNORMAL HIGH (ref 6–23)
CO2: 28 mEq/L (ref 19–32)
Calcium: 10.5 mg/dL (ref 8.4–10.5)
Chloride: 99 mEq/L (ref 96–112)
Creatinine, Ser: 1.76 mg/dL — ABNORMAL HIGH (ref 0.40–1.50)
GFR: 35.98 mL/min — ABNORMAL LOW (ref >60.00)
Glucose, Bld: 82 mg/dL (ref 70–99)
Potassium: 4.4 mEq/L (ref 3.5–5.1)
Sodium: 137 mEq/L (ref 135–145)
Total Bilirubin: 0.4 mg/dL (ref 0.2–1.2)
Total Protein: 8.1 g/dL (ref 6.0–8.3)

## 2022-04-22 LAB — HEMOGLOBIN A1C: Hgb A1c MFr Bld: 7.3 % — ABNORMAL HIGH (ref 4.6–6.5)

## 2022-04-22 LAB — LIPID PANEL
Cholesterol: 107 mg/dL (ref 0–200)
HDL: 39.4 mg/dL (ref >39.00)
LDL Cholesterol: 34 mg/dL (ref 0–99)
NonHDL: 68.06
Total CHOL/HDL Ratio: 3
Triglycerides: 172 mg/dL — ABNORMAL HIGH (ref 0.0–149.0)
VLDL: 34.4 mg/dL (ref 0.0–40.0)

## 2022-06-24 ENCOUNTER — Other Ambulatory Visit: Payer: Self-pay | Admitting: Family Medicine

## 2022-06-24 DIAGNOSIS — K219 Gastro-esophageal reflux disease without esophagitis: Secondary | ICD-10-CM

## 2022-07-10 DIAGNOSIS — I129 Hypertensive chronic kidney disease with stage 1 through stage 4 chronic kidney disease, or unspecified chronic kidney disease: Secondary | ICD-10-CM | POA: Diagnosis not present

## 2022-07-10 DIAGNOSIS — E1122 Type 2 diabetes mellitus with diabetic chronic kidney disease: Secondary | ICD-10-CM | POA: Diagnosis not present

## 2022-07-10 DIAGNOSIS — N1832 Chronic kidney disease, stage 3b: Secondary | ICD-10-CM | POA: Diagnosis not present

## 2022-07-10 DIAGNOSIS — N390 Urinary tract infection, site not specified: Secondary | ICD-10-CM | POA: Diagnosis not present

## 2022-07-10 DIAGNOSIS — E785 Hyperlipidemia, unspecified: Secondary | ICD-10-CM | POA: Diagnosis not present

## 2022-07-10 DIAGNOSIS — R8281 Pyuria: Secondary | ICD-10-CM | POA: Diagnosis not present

## 2022-07-11 ENCOUNTER — Other Ambulatory Visit: Payer: Self-pay | Admitting: Internal Medicine

## 2022-07-11 DIAGNOSIS — N1832 Chronic kidney disease, stage 3b: Secondary | ICD-10-CM

## 2022-07-24 ENCOUNTER — Encounter: Payer: Self-pay | Admitting: Family Medicine

## 2022-07-24 ENCOUNTER — Ambulatory Visit (INDEPENDENT_AMBULATORY_CARE_PROVIDER_SITE_OTHER): Payer: Medicare HMO | Admitting: Family Medicine

## 2022-07-24 VITALS — BP 138/68 | HR 94 | Temp 98.6°F | Ht 70.0 in | Wt 196.2 lb

## 2022-07-24 DIAGNOSIS — E1129 Type 2 diabetes mellitus with other diabetic kidney complication: Secondary | ICD-10-CM

## 2022-07-24 DIAGNOSIS — I10 Essential (primary) hypertension: Secondary | ICD-10-CM | POA: Diagnosis not present

## 2022-07-24 DIAGNOSIS — R7989 Other specified abnormal findings of blood chemistry: Secondary | ICD-10-CM

## 2022-07-24 DIAGNOSIS — R809 Proteinuria, unspecified: Secondary | ICD-10-CM | POA: Diagnosis not present

## 2022-07-24 LAB — HEMOGLOBIN A1C: Hgb A1c MFr Bld: 7.7 % — ABNORMAL HIGH (ref 4.6–6.5)

## 2022-07-24 LAB — BASIC METABOLIC PANEL
BUN: 26 mg/dL — ABNORMAL HIGH (ref 6–23)
CO2: 30 mEq/L (ref 19–32)
Calcium: 10.1 mg/dL (ref 8.4–10.5)
Chloride: 99 mEq/L (ref 96–112)
Creatinine, Ser: 1.64 mg/dL — ABNORMAL HIGH (ref 0.40–1.50)
GFR: 39.09 mL/min — ABNORMAL LOW (ref >60.00)
Glucose, Bld: 130 mg/dL — ABNORMAL HIGH (ref 70–99)
Potassium: 4.1 mEq/L (ref 3.5–5.1)
Sodium: 138 mEq/L (ref 135–145)

## 2022-07-24 LAB — GLUCOSE, POCT (MANUAL RESULT ENTRY): POC Glucose: 149 mg/dl — AB (ref 70–99)

## 2022-07-24 NOTE — Progress Notes (Signed)
 Subjective:  Patient ID: Gabriel E Dauber Sr., male    DOB: 12/24/1940  Age: 81 y.o. MRN: [id number]  CC:  Chief Complaint  Patient presents with   Diabetes    Notes sugar is still a bit high 207, 213 over the last few days, not symptomatic at this time     HPI Gabriel E Grandstaff Sr. presents for   No change in health since last visit   Diabetes: With microalbuminuria, hyperglycemia.  Last A1c only slightly elevated at 7.3 in October.  Elevated creatinine previous 6 to 8 months at that time, possible hypovolemia, prerenal azotemia.  Avoidance of NSAIDs and closely monitoring renal function to decide on hold of metformin/SGLT2.  Has been referred to nephrology previously.  He is on ACE inhibitor and statin.  Metformin 500 mg twice daily, Jardiance 10 mg daily for diabetes.  Glucose 82 on October 2 labs. Creatinine had been improving from 1.70-1 0.56-1.43 in August, then increased to 1.76 at his October 2 visit.Continue hydration and avoidance of NSAIDs,  eGFR 35.98 at that time. Saw nephology in past month.  No med changes. Had a urine test. Told had leveling off of kidney test. Note unavailable at this time. Recheck planned 6months.  Home readings - 207-213.  No n/v/abd pain or blurry vision.  No sx lows.  Microalbumin: Normal ratio 04/21/22 Optho, foot exam, pneumovax:  Due for optho - plans to call and schedule.  Declines foot exam at this visit. denies numbness or foot issues.  Lab Results  Component Value Date   HGBA1C 7.3 (H) 04/21/2022   HGBA1C 7.3 (H) 01/01/2022   HGBA1C 7.6 (A) 09/30/2021   Lab Results  Component Value Date   MICROALBUR 1.3 04/21/2022   LDLCALC 34 04/21/2022   CREATININE 1.76 (H) 04/21/2022   Hypertension: With elevated creatinine previously as above.  Blood pressure treated with lisinopril HCTZ 20/12.5 mg daily. No new med side effects.   BP Readings from Last 3 Encounters:  07/24/22 138/68  04/21/22 118/62  03/19/22 122/74   Lab  Results  Component Value Date   CREATININE 1.76 (H) 04/21/2022      History Patient Active Problem List   Diagnosis Date Noted   DM (diabetes mellitus) (HCC) 05/12/2016   Hyperlipemia    Chest pain 04/12/2016   Essential hypertension 04/12/2016   Dyspepsia 04/12/2016   Past Medical History:  Diagnosis Date   Hypertension    Urolithiasis    Past stone spontaneously per patient.   No past surgical history on file. No Known Allergies Prior to Admission medications   Medication Sig Start Date End Date Taking? Authorizing Provider  aspirin 81 MG EC tablet Take 1 tablet (81 mg total) by mouth daily. 08/29/19  Yes Greene, Jeffrey R, MD  atorvastatin (LIPITOR) 40 MG tablet TAKE 1 TABLET BY MOUTH ONCE DAILY AT  6  PM. 04/21/22  Yes Greene, Jeffrey R, MD  blood glucose meter kit and supplies Dispense based on patient and insurance preference. Use once per day and if symptoms of low blood sugar. (FOR ICD-10 E10.9, E11.9). 01/01/22  Yes Greene, Jeffrey R, MD  empagliflozin (JARDIANCE) 10 MG TABS tablet Take 1 tablet (10 mg total) by mouth daily. 04/21/22  Yes Greene, Jeffrey R, MD  fluticasone (FLONASE) 50 MCG/ACT nasal spray Place 2 sprays into both nostrils daily. 04/21/22  Yes Greene, Jeffrey R, MD  lisinopril-hydrochlorothiazide (ZESTORETIC) 20-12.5 MG tablet Take 1 tablet by mouth daily. 04/21/22  Yes Greene, Jeffrey R, MD    metFORMIN (GLUCOPHAGE) 500 MG tablet Take 1 tablet (500 mg total) by mouth 2 (two) times daily with a meal. 04/21/22  Yes Greene, Jeffrey R, MD  omeprazole (PRILOSEC) 20 MG capsule Take 1 capsule by mouth once daily 06/24/22  Yes Greene, Jeffrey R, MD   Social History   Socioeconomic History   Marital status: Single    Spouse name: Not on file   Number of children: 2   Years of education: Not on file   Highest education level: Not on file  Occupational History   Occupation: retired  Tobacco Use   Smoking status: Former    Types: Cigars    Quit date: 07/21/2010     Years since quitting: 12.0   Smokeless tobacco: Never  Vaping Use   Vaping Use: Never used  Substance and Sexual Activity   Alcohol use: No   Drug use: No   Sexual activity: Never  Other Topics Concern   Not on file  Social History Narrative   Lives alone - sons live in [place]   Social Determinants of Health   Financial Resource Strain: Low Risk  (08/29/2021)   Overall Financial Resource Strain (CARDIA)    Difficulty of Paying Living Expenses: Not hard at all  Food Insecurity: No Food Insecurity (08/29/2021)   Hunger Vital Sign    Worried About Running Out of Food in the Last Year: Never true    Ran Out of Food in the Last Year: Never true  Transportation Needs: No Transportation Needs (08/29/2021)   PRAPARE - Transportation    Lack of Transportation (Medical): No    Lack of Transportation (Non-Medical): No  Physical Activity: Insufficiently Active (08/29/2021)   Exercise Vital Sign    Days of Exercise per Week: 4 days    Minutes of Exercise per Session: 30 min  Stress: No Stress Concern Present (08/29/2021)   [employer] - Occupational Stress Questionnaire    Feeling of Stress : Not at all  Social Connections: Socially Isolated (08/29/2021)   Social Connection and Isolation Panel [NHANES]    Frequency of Communication with Friends and Family: More than three times a week    Frequency of Social Gatherings with Friends and Family: More than three times a week    Attends Religious Services: Never    Active Member of Clubs or Organizations: No    Attends Club or Organization Meetings: Never    Marital Status: Divorced  Intimate Partner Violence: Not At Risk (08/29/2021)   Humiliation, Afraid, Rape, and Kick questionnaire    Fear of Current or Ex-Partner: No    Emotionally Abused: No    Physically Abused: No    Sexually Abused: No    Review of Systems  Constitutional:  Negative for fatigue and unexpected weight change.  Eyes:  Negative for visual  disturbance.  Respiratory:  Negative for cough, chest tightness and shortness of breath.   Cardiovascular:  Negative for chest pain, palpitations and leg swelling.  Gastrointestinal:  Negative for abdominal pain and blood in stool.  Neurological:  Negative for dizziness, light-headedness and headaches.     Objective:   Vitals:   07/24/22 1335  BP: 138/68  Pulse: 94  Temp: 98.6 F (37 C)  TempSrc: Oral  SpO2: 96%  Weight: 196 lb 3.2 oz (89 kg)  Height: 5' 10" (1.778 m)     Physical Exam Vitals reviewed.  Constitutional:      Appearance: He is well-developed.  HENT:       Head: Normocephalic and atraumatic.  Neck:     Vascular: No carotid bruit or JVD.  Cardiovascular:     Rate and Rhythm: Normal rate and regular rhythm.     Heart sounds: Normal heart sounds. No murmur heard. Pulmonary:     Effort: Pulmonary effort is normal.     Breath sounds: Normal breath sounds. No rales.  Musculoskeletal:     Right lower leg: No edema.     Left lower leg: No edema.  Skin:    General: Skin is warm and dry.  Neurological:     Mental Status: He is alert and oriented to person, place, and time.  Psychiatric:        Mood and Affect: Mood normal.     Results for orders placed or performed in visit on 07/24/22  POCT glucose (manual entry)  Result Value Ref Range   POC Glucose 149 (A) 70 - 99 mg/dl      Assessment & Plan:  Gabriel E Molnar Sr. is a 81 y.o. male . Type 2 diabetes mellitus with microalbuminuria, without long-term current use of insulin (HCC) - Plan: POCT glycosylated hemoglobin (Hb A1C), POCT glucose (manual entry), Basic metabolic panel, Hemoglobin A1c  -Some elevated readings at home recently but lower in office.  Check A1c, updated labs including creatinine to help decide on changes.  Also have requested recent nephrology eval.  If discrepancy between home readings, and A1c can schedule visit to check accuracy of meter. Continue statin, ACE inhibitor.   Declined foot exam at this time, plans to schedule ophthalmology visit.  Increase in creatinine - Plan: Basic metabolic panel Essential hypertension  -Recent nephrology visit, records requested.  Check updated BMP, continue to stay hydrated and avoid nephrotoxins.  No med changes for now.  Tolerating current regimen.  Continue same dose lisinopril HCTZ, same dose Jardiance for now.  No orders of the defined types were placed in this encounter.  Patient Instructions  Call and schedule eye doctor appointment for diabetic eye exam.  I will check labs today to decide on med changes.  Take care.     Signed,   Jeffrey Greene, MD [facility], [facility] 07/24/22 2:12 PM   

## 2022-07-24 NOTE — Patient Instructions (Addendum)
Call and schedule eye doctor appointment for diabetic eye exam.  I will check labs today to decide on med changes.  Take care.

## 2022-07-29 ENCOUNTER — Telehealth: Payer: Self-pay

## 2022-07-29 NOTE — Telephone Encounter (Signed)
Informed pt of results pt states he has a f/u apt with Dr Carlota Raspberry in 3 months

## 2022-07-29 NOTE — Telephone Encounter (Signed)
-----   Message from Wendie Agreste, MD sent at 07/29/2022  2:59 PM EST ----- Please send lab letter  Kidney function test elevated but improved from a few months ago.  Blood sugar a few points elevated.  31-month blood sugar test 7.7, would like to see that below 7.5 ideally.  No medication changes for now, watch diet, avoid sugar-containing beverages and recheck levels in 3 months.  Let me know if there are questions.  Dr. Carlota Raspberry

## 2022-07-31 ENCOUNTER — Other Ambulatory Visit: Payer: Medicare HMO

## 2022-08-13 ENCOUNTER — Ambulatory Visit
Admission: RE | Admit: 2022-08-13 | Discharge: 2022-08-13 | Disposition: A | Discharge status: Home / Self Care | Payer: Medicare HMO | Source: Ambulatory Visit | Attending: Internal Medicine | Admitting: Internal Medicine

## 2022-08-13 DIAGNOSIS — N189 Chronic kidney disease, unspecified: Secondary | ICD-10-CM | POA: Diagnosis not present

## 2022-08-13 DIAGNOSIS — N1832 Chronic kidney disease, stage 3b: Secondary | ICD-10-CM

## 2022-08-13 DIAGNOSIS — N200 Calculus of kidney: Secondary | ICD-10-CM | POA: Diagnosis not present

## 2022-08-14 DIAGNOSIS — H2513 Age-related nuclear cataract, bilateral: Secondary | ICD-10-CM | POA: Diagnosis not present

## 2022-08-14 DIAGNOSIS — E119 Type 2 diabetes mellitus without complications: Secondary | ICD-10-CM | POA: Diagnosis not present

## 2022-08-14 DIAGNOSIS — H40022 Open angle with borderline findings, high risk, left eye: Secondary | ICD-10-CM | POA: Diagnosis not present

## 2022-08-14 DIAGNOSIS — H524 Presbyopia: Secondary | ICD-10-CM | POA: Diagnosis not present

## 2022-08-14 DIAGNOSIS — H5203 Hypermetropia, bilateral: Secondary | ICD-10-CM | POA: Diagnosis not present

## 2022-08-14 DIAGNOSIS — H401112 Primary open-angle glaucoma, right eye, moderate stage: Secondary | ICD-10-CM | POA: Diagnosis not present

## 2022-08-14 LAB — HM DIABETES EYE EXAM

## 2022-08-19 ENCOUNTER — Encounter: Payer: Self-pay | Admitting: Family Medicine

## 2022-09-16 DIAGNOSIS — H401112 Primary open-angle glaucoma, right eye, moderate stage: Secondary | ICD-10-CM | POA: Diagnosis not present

## 2022-09-16 DIAGNOSIS — H40022 Open angle with borderline findings, high risk, left eye: Secondary | ICD-10-CM | POA: Diagnosis not present

## 2022-09-16 LAB — HM DIABETES EYE EXAM

## 2022-09-22 ENCOUNTER — Other Ambulatory Visit: Payer: Self-pay | Admitting: Family Medicine

## 2022-09-22 DIAGNOSIS — K219 Gastro-esophageal reflux disease without esophagitis: Secondary | ICD-10-CM

## 2022-10-15 DIAGNOSIS — H401112 Primary open-angle glaucoma, right eye, moderate stage: Secondary | ICD-10-CM | POA: Diagnosis not present

## 2022-10-15 DIAGNOSIS — H40022 Open angle with borderline findings, high risk, left eye: Secondary | ICD-10-CM | POA: Diagnosis not present

## 2022-10-15 LAB — HM DIABETES EYE EXAM

## 2022-10-23 ENCOUNTER — Encounter: Payer: Self-pay | Admitting: Family Medicine

## 2022-10-23 ENCOUNTER — Ambulatory Visit (INDEPENDENT_AMBULATORY_CARE_PROVIDER_SITE_OTHER): Payer: Medicare HMO | Admitting: Family Medicine

## 2022-10-23 VITALS — BP 110/62 | HR 96 | Temp 98.2°F | Ht 70.0 in | Wt 195.2 lb

## 2022-10-23 DIAGNOSIS — I10 Essential (primary) hypertension: Secondary | ICD-10-CM

## 2022-10-23 DIAGNOSIS — R002 Palpitations: Secondary | ICD-10-CM

## 2022-10-23 DIAGNOSIS — R809 Proteinuria, unspecified: Secondary | ICD-10-CM

## 2022-10-23 DIAGNOSIS — I499 Cardiac arrhythmia, unspecified: Secondary | ICD-10-CM

## 2022-10-23 DIAGNOSIS — E1129 Type 2 diabetes mellitus with other diabetic kidney complication: Secondary | ICD-10-CM | POA: Diagnosis not present

## 2022-10-23 DIAGNOSIS — E785 Hyperlipidemia, unspecified: Secondary | ICD-10-CM

## 2022-10-23 MED ORDER — EMPAGLIFLOZIN 10 MG PO TABS: 10.0000 mg | ORAL_TABLET | Freq: Every day | ORAL | 2 refills | Status: DC

## 2022-10-23 MED ORDER — LISINOPRIL-HYDROCHLOROTHIAZIDE 20-12.5 MG PO TABS: 1.0000 | ORAL_TABLET | Freq: Every day | ORAL | 2 refills | Status: DC

## 2022-10-23 MED ORDER — ATORVASTATIN CALCIUM 40 MG PO TABS
ORAL_TABLET | ORAL | 2 refills | Status: DC
Start: 2022-10-23 — End: 2023-11-25

## 2022-10-23 MED ORDER — METFORMIN HCL 500 MG PO TABS: 500.0000 mg | ORAL_TABLET | Freq: Two times a day (BID) | ORAL | 2 refills | Status: DC

## 2022-10-23 NOTE — Progress Notes (Signed)
Subjective:  Patient ID: Gabriel Collar Sr., male    DOB: March 27, 1941  Age: 82 y.o. MRN: [id number]  CC:  Chief Complaint  Patient presents with   Follow-up    Med check and labs. Pt refused foot exam.    HPI Gabriel E Youman Sr. presents for  Follow up. Doing well, no new concerns.   Diabetes: With microalbuminuria, hyperglycemia.  Slight elevated A1c in January.  Prior elevated creatinine, thought to be hypovolemia, prerenal azotemia and plan for avoidance of NSAIDs, closely monitor renal function to decide if continuance of metformin and SGLT2.  Has been referred to nephrology, continued monitoring with 5-month follow-up.Marland Kitchen  He is on ACE inhibitor and statin, metformin 500 mg twice daily (diarrhea at 1000mg  BID, thinks he did ok at 850mg ).  Jardiance 10 mg daily for diabetes.  Last creatinine improved down from 1.76-1.64. Slight elevated A1c of 7.7, goal of 7.5 at his age, no changes at last visit. No new med side effects or missed doses.  No uti or mycotic infection sx's.  No advil, alleve. Drinking fluids.   Home readings fasting - none.  Postprandial - random last night 247, some 200's  Symptomatic lows -none.   Microalbumin: Normal ratio 04/21/22. Optho, foot exam, pneumovax: Foot exam declined.  Recommended diabetic retinopathy screening last visit.-Evaluated January 25 with Dr. Herbert Deaner  Lab Results  Component Value Date   HGBA1C 7.7 (H) 07/24/2022   HGBA1C 7.3 (H) 04/21/2022   HGBA1C 7.3 (H) 01/01/2022   Lab Results  Component Value Date   MICROALBUR 1.3 04/21/2022   LDLCALC 34 04/21/2022   CREATININE 1.64 (H) 07/24/2022   Hypertension: Lisinopril HCTZ 20/12.5 mg daily, no new side effects.  Home readings: 132/90 last night.  BP Readings from Last 3 Encounters:  10/23/22 110/62  07/24/22 138/68  04/21/22 118/62   Lab Results  Component Value Date   CREATININE 1.64 (H) 07/24/2022   Hyperlipidemia: Lipitor 40 mg daily, no new myalgias/side effects.   Lab Results  Component Value Date   CHOL 107 04/21/2022   HDL 39.40 04/21/2022   LDLCALC 34 04/21/2022   LDLDIRECT 48.0 07/08/2021   TRIG 172.0 (H) 04/21/2022   CHOLHDL 3 04/21/2022   Lab Results  Component Value Date   ALT 15 04/21/2022   AST 16 04/21/2022   ALKPHOS 78 04/21/2022   BILITOT 0.4 04/21/2022   Palpitations: Few weeks ago with getting up to go to bathroom. Faster heartbeat, no CP, dyspnea, near-syncope, HA or weakness. Asx currently.   Nuclear stress test neg for ischemia in 2017. Prior EKG with possible A.flutter, then read as NSR.   History Patient Active Problem List   Diagnosis Date Noted   DM (diabetes mellitus) 05/12/2016   Hyperlipemia    Chest pain 04/12/2016   Essential hypertension 04/12/2016   Dyspepsia 04/12/2016   Past Medical History:  Diagnosis Date   Hypertension    Urolithiasis    Past stone spontaneously per patient.   History reviewed. No pertinent surgical history. No Known Allergies Prior to Admission medications   Medication Sig Start Date End Date Taking? Authorizing Provider  aspirin 81 MG EC tablet Take 1 tablet (81 mg total) by mouth daily. 08/29/19  Yes Wendie Agreste, MD  atorvastatin (LIPITOR) 40 MG tablet TAKE 1 TABLET BY MOUTH ONCE DAILY AT  6  PM. 04/21/22  Yes Wendie Agreste, MD  blood glucose meter kit and supplies Dispense based on patient and insurance preference. Use once per day  and if symptoms of low blood sugar. (FOR ICD-10 E10.9, E11.9). 01/01/22  Yes Wendie Agreste, MD  empagliflozin (JARDIANCE) 10 MG TABS tablet Take 1 tablet (10 mg total) by mouth daily. 04/21/22  Yes Wendie Agreste, MD  fluticasone (FLONASE) 50 MCG/ACT nasal spray Place 2 sprays into both nostrils daily. 04/21/22  Yes Wendie Agreste, MD  lisinopril-hydrochlorothiazide (ZESTORETIC) 20-12.5 MG tablet Take 1 tablet by mouth daily. 04/21/22  Yes Wendie Agreste, MD  metFORMIN (GLUCOPHAGE) 500 MG tablet Take 1 tablet (500 mg total) by mouth  2 (two) times daily with a meal. 04/21/22  Yes Wendie Agreste, MD  omeprazole (PRILOSEC) 20 MG capsule Take 1 capsule by mouth once daily 09/23/22  Yes Wendie Agreste, MD   Social History   Socioeconomic History   Marital status: Single    Spouse name: Not on file   Number of children: 2   Years of education: Not on file   Highest education level: Not on file  Occupational History   Occupation: retired  Tobacco Use   Smoking status: Former    Types: Cigars    Quit date: 07/21/2010    Years since quitting: 12.2   Smokeless tobacco: Never  Vaping Use   Vaping Use: Never used  Substance and Sexual Activity   Alcohol use: No   Drug use: No   Sexual activity: Never  Other Topics Concern   Not on file  Social History Narrative   Lives alone - sons live in [place] Strain: [place]  (08/29/2021)   Overall Financial Resource Strain (CARDIA)    Difficulty of Paying Living Expenses: Not hard at all  Food Insecurity: No [place] (08/29/2021)   Hunger Vital Sign    Worried About Running Out of Food in the Last Year: Never true    [place] in the Last Year: Never true  Transportation Needs: No Transportation Needs (08/29/2021)   PRAPARE - Hydrologist (Medical): No    Lack of Transportation (Non-Medical): No  Physical Activity: Insufficiently Active (08/29/2021)   Exercise Vital Sign    Days of Exercise per Week: 4 days    Minutes of Exercise per Session: 30 min  Stress: No Stress Concern Present (08/29/2021)   [place]    Feeling of Stress : Not at all  Social Connections: Socially Isolated (08/29/2021)   Social Connection and Isolation Panel [NHANES]    Frequency of Communication with Friends and Family: More than three times a week    Frequency of Social Gatherings with Friends and Family: More than three times a week     Attends Religious Services: Never    Marine scientist or Organizations: No    Attends Archivist Meetings: Never    Marital Status: Divorced  Human resources officer Violence: Not At Risk (08/29/2021)   Humiliation, Afraid, Rape, and Kick questionnaire    Fear of Current or Ex-Partner: No    Emotionally Abused: No    Physically Abused: No    Sexually Abused: No    Review of Systems  Constitutional:  Negative for fatigue and unexpected weight change.  Eyes:  Negative for visual disturbance.  Respiratory:  Negative for cough, chest tightness and shortness of breath.   Cardiovascular:  Positive for palpitations. Negative for chest pain and leg swelling.  Gastrointestinal:  Negative for  abdominal pain and blood in stool.  Neurological:  Negative for dizziness, light-headedness and headaches.   Objective:   Vitals:   10/23/22 1342  BP: 110/62  Pulse: 96  Temp: 98.2 F (36.8 C)  TempSrc: Temporal  SpO2: 99%  Weight: 195 lb 3.2 oz (88.5 kg)  Height: 5\' 10"  (1.778 m)     Physical Exam Vitals reviewed.  Constitutional:      Appearance: He is well-developed.  HENT:     Head: Normocephalic and atraumatic.  Neck:     Vascular: No carotid bruit or JVD.  Cardiovascular:     Rate and Rhythm: Tachycardia present. Rhythm irregular.     Heart sounds: Normal heart sounds. No murmur heard.    Comments: Tachycardic with rate in approximately 90s, irregular heart rate with frequent ectopy. Pulmonary:     Effort: Pulmonary effort is normal.     Breath sounds: Normal breath sounds. No rales.  Musculoskeletal:     Right lower leg: No edema.     Left lower leg: No edema.  Skin:    General: Skin is warm and dry.  Neurological:     Mental Status: He is alert and oriented to person, place, and time.  Psychiatric:        Mood and Affect: Mood normal.    EKG, sinus rhythm with PAC, 90 bpm.  No appreciable change in ST or T waves compared to 04/12/2016 EKG.  No apparent acute  findings.  Assessment & Plan:  QUENTINE RAGIN Sr. is a 82 y.o. male . Type 2 diabetes mellitus with microalbuminuria, without long-term current use of insulin - Plan: Hemoglobin A1c, empagliflozin (JARDIANCE) 10 MG TABS tablet, metFORMIN (GLUCOPHAGE) 500 MG tablet  -Check labs, then likely will need medication adjustment.  Will need to be cautious with metformin dosing due to prior side effects and renal function.  No med changes for now.  Essential hypertension - Plan: lisinopril-hydrochlorothiazide (ZESTORETIC) 20-12.5 MG tablet, Comprehensive metabolic panel  -Stable, tolerating current regimen, check updated labs.  Maintain hydration, avoid NSAIDs with elevated creatinine, follow-up with nephrology as planned.  Hyperlipidemia, unspecified hyperlipidemia type - Plan: atorvastatin (LIPITOR) 40 MG tablet, Comprehensive metabolic panel, Lipid panel  -Tolerating current dose of statin, continue same.  Palpitations - Plan: TSH, CBC, EKG 12-Lead Irregular heart rhythm  -Asymptomatic currently, PACs noted on EKG.  Recommended follow-up or ER precautions if return of palpitations.  Check labs above.  Meds ordered this encounter  Medications   atorvastatin (LIPITOR) 40 MG tablet    Sig: TAKE 1 TABLET BY MOUTH ONCE DAILY AT  6  PM.    Dispense:  90 tablet    Refill:  2   empagliflozin (JARDIANCE) 10 MG TABS tablet    Sig: Take 1 tablet (10 mg total) by mouth daily.    Dispense:  90 tablet    Refill:  2   lisinopril-hydrochlorothiazide (ZESTORETIC) 20-12.5 MG tablet    Sig: Take 1 tablet by mouth daily.    Dispense:  90 tablet    Refill:  2   metFORMIN (GLUCOPHAGE) 500 MG tablet    Sig: Take 1 tablet (500 mg total) by mouth 2 (two) times daily with a meal.    Dispense:  180 tablet    Refill:  2   Patient Instructions  Based on home blood sugars likely will need to adjust your medication.  I will let you know once I review your lab work. I am also checking other labs for the  heart  palpitations.  See information below.  If you have persistent heart palpitations or any symptoms associated with those palpitations such as chest pain or shortness of breath, be seen right away. Return to the clinic or go to the nearest emergency room if any of your symptoms worsen or new symptoms occur.   Palpitations Palpitations are feelings that your heartbeat is irregular or is faster than normal. It may feel like your heart is fluttering or skipping a beat. Palpitations may be caused by many things, including smoking, caffeine, alcohol, stress, and certain medicines or drugs. Most causes of palpitations are not serious.  However, some palpitations can be a sign of a serious problem. Further tests and a thorough medical history will be done to find the cause of your palpitations. Your provider may order tests such as an ECG, labs, an echocardiogram, or an ambulatory continuous ECG monitor. Follow these instructions at home: Pay attention to any changes in your symptoms. Let your health care provider know about them. Take these actions to help manage your symptoms: Eating and drinking Follow instructions from your health care provider about eating or drinking restrictions. You may need to avoid foods and drinks that may cause palpitations. These may include: Caffeinated coffee, tea, soft drinks, and energy drinks. Chocolate. Alcohol. Diet pills. Lifestyle     Take steps to reduce your stress and anxiety. Things that can help you relax include: Yoga. Mind-body activities, such as deep breathing, meditation, or using words and images to create positive thoughts (guided imagery). Physical activity, such as swimming, jogging, or walking. Tell your health care provider if your palpitations increase with activity. If you have chest pain or shortness of breath with activity, do not continue the activity until you are seen by your health care provider. Biofeedback. This is a method that helps you  learn to use your mind to control things in your body, such as your heartbeat. Get plenty of rest and sleep. Keep a regular bed time. Do not use drugs, including cocaine or ecstasy. Do not use marijuana. Do not use any products that contain nicotine or tobacco. These products include cigarettes, chewing tobacco, and vaping devices, such as e-cigarettes. If you need help quitting, ask your health care provider. General instructions Take over-the-counter and prescription medicines only as told by your health care provider. Keep all follow-up visits. This is important. These may include visits for further testing if palpitations do not go away or get worse. Contact a health care provider if: You continue to have a fast or irregular heartbeat for a long period of time. You notice that your palpitations occur more often. Get help right away if: You have chest pain or shortness of breath. You have a severe headache. You feel dizzy or you faint. These symptoms may represent a serious problem that is an emergency. Do not wait to see if the symptoms will go away. Get medical help right away. Call your local emergency services (911 in the [place] Do not drive yourself to the hospital. Summary Palpitations are feelings that your heartbeat is irregular or is faster than normal. It may feel like your heart is fluttering or skipping a beat. Palpitations may be caused by many things, including smoking, caffeine, alcohol, stress, certain medicines, and drugs. Further tests and a thorough medical history may be done to find the cause of your palpitations. Get help right away if you faint or have chest pain, shortness of breath, severe headache, or dizziness. This information is  not intended to replace advice given to you by your health care provider. Make sure you discuss any questions you have with your health care provider. Document Revised: 11/28/2020 Document Reviewed: 11/28/2020 Elsevier Patient Education   2023 Elsevier Inc.       Signed,   Meredith Staggers, MD [facility], [facility] 10/23/22 2:34 PM

## 2022-10-23 NOTE — Patient Instructions (Addendum)
Based on home blood sugars likely will need to adjust your medication.  I will let you know once I review your lab work. I am also checking other labs for the heart palpitations.  See information below.  If you have persistent heart palpitations or any symptoms associated with those palpitations such as chest pain or shortness of breath, be seen right away. Return to the clinic or go to the nearest emergency room if any of your symptoms worsen or new symptoms occur.   Palpitations Palpitations are feelings that your heartbeat is irregular or is faster than normal. It may feel like your heart is fluttering or skipping a beat. Palpitations may be caused by many things, including smoking, caffeine, alcohol, stress, and certain medicines or drugs. Most causes of palpitations are not serious.  However, some palpitations can be a sign of a serious problem. Further tests and a thorough medical history will be done to find the cause of your palpitations. Your provider may order tests such as an ECG, labs, an echocardiogram, or an ambulatory continuous ECG monitor. Follow these instructions at home: Pay attention to any changes in your symptoms. Let your health care provider know about them. Take these actions to help manage your symptoms: Eating and drinking Follow instructions from your health care provider about eating or drinking restrictions. You may need to avoid foods and drinks that may cause palpitations. These may include: Caffeinated coffee, tea, soft drinks, and energy drinks. Chocolate. Alcohol. Diet pills. Lifestyle     Take steps to reduce your stress and anxiety. Things that can help you relax include: Yoga. Mind-body activities, such as deep breathing, meditation, or using words and images to create positive thoughts (guided imagery). Physical activity, such as swimming, jogging, or walking. Tell your health care provider if your palpitations increase with activity. If you have chest  pain or shortness of breath with activity, do not continue the activity until you are seen by your health care provider. Biofeedback. This is a method that helps you learn to use your mind to control things in your body, such as your heartbeat. Get plenty of rest and sleep. Keep a regular bed time. Do not use drugs, including cocaine or ecstasy. Do not use marijuana. Do not use any products that contain nicotine or tobacco. These products include cigarettes, chewing tobacco, and vaping devices, such as e-cigarettes. If you need help quitting, ask your health care provider. General instructions Take over-the-counter and prescription medicines only as told by your health care provider. Keep all follow-up visits. This is important. These may include visits for further testing if palpitations do not go away or get worse. Contact a health care provider if: You continue to have a fast or irregular heartbeat for a long period of time. You notice that your palpitations occur more often. Get help right away if: You have chest pain or shortness of breath. You have a severe headache. You feel dizzy or you faint. These symptoms may represent a serious problem that is an emergency. Do not wait to see if the symptoms will go away. Get medical help right away. Call your local emergency services (911 in the [place] Do not drive yourself to the hospital. Summary Palpitations are feelings that your heartbeat is irregular or is faster than normal. It may feel like your heart is fluttering or skipping a beat. Palpitations may be caused by many things, including smoking, caffeine, alcohol, stress, certain medicines, and drugs. Further tests and a thorough medical history  may be done to find the cause of your palpitations. Get help right away if you faint or have chest pain, shortness of breath, severe headache, or dizziness. This information is not intended to replace advice given to you by your health care provider.  Make sure you discuss any questions you have with your health care provider. Document Revised: 11/28/2020 Document Reviewed: 11/28/2020 Elsevier Patient Education  [place].

## 2022-10-24 ENCOUNTER — Encounter: Payer: Self-pay | Admitting: Family Medicine

## 2022-10-24 LAB — LIPID PANEL
Cholesterol: 124 mg/dL (ref 0–200)
HDL: 42 mg/dL (ref >39.00)
NonHDL: 82.15
Total CHOL/HDL Ratio: 3
Triglycerides: 215 mg/dL — ABNORMAL HIGH (ref 0.0–149.0)
VLDL: 43 mg/dL — ABNORMAL HIGH (ref 0.0–40.0)

## 2022-10-24 LAB — COMPREHENSIVE METABOLIC PANEL
ALT: 14 U/L (ref 0–53)
AST: 18 U/L (ref 0–37)
Albumin: 4.7 g/dL (ref 3.5–5.2)
Alkaline Phosphatase: 81 U/L (ref 39–117)
BUN: 32 mg/dL — ABNORMAL HIGH (ref 6–23)
CO2: 26 mEq/L (ref 19–32)
Calcium: 10.6 mg/dL — ABNORMAL HIGH (ref 8.4–10.5)
Chloride: 100 mEq/L (ref 96–112)
Creatinine, Ser: 1.97 mg/dL — ABNORMAL HIGH (ref 0.40–1.50)
GFR: 31.32 mL/min — ABNORMAL LOW (ref >60.00)
Glucose, Bld: 143 mg/dL — ABNORMAL HIGH (ref 70–99)
Potassium: 4.4 mEq/L (ref 3.5–5.1)
Sodium: 136 mEq/L (ref 135–145)
Total Bilirubin: 0.4 mg/dL (ref 0.2–1.2)
Total Protein: 8.2 g/dL (ref 6.0–8.3)

## 2022-10-24 LAB — CBC
HCT: 45.1 % (ref 39.0–52.0)
Hemoglobin: 15.1 g/dL (ref 13.0–17.0)
MCHC: 33.5 g/dL (ref 30.0–36.0)
MCV: 82.4 fl (ref 78.0–100.0)
Platelets: 259 10*3/uL (ref 150.0–400.0)
RBC: 5.47 Mil/uL (ref 4.22–5.81)
RDW: 15.6 % — ABNORMAL HIGH (ref 11.5–15.5)
WBC: 11.4 10*3/uL — ABNORMAL HIGH (ref 4.0–10.5)

## 2022-10-24 LAB — TSH: TSH: 3.4 u[IU]/mL (ref 0.35–5.50)

## 2022-10-24 LAB — HEMOGLOBIN A1C: Hgb A1c MFr Bld: 8.4 % — ABNORMAL HIGH (ref 4.6–6.5)

## 2022-10-24 LAB — LDL CHOLESTEROL, DIRECT: Direct LDL: 53 mg/dL

## 2022-11-06 ENCOUNTER — Encounter: Payer: Self-pay | Admitting: Family Medicine

## 2022-11-06 ENCOUNTER — Ambulatory Visit (INDEPENDENT_AMBULATORY_CARE_PROVIDER_SITE_OTHER): Payer: Medicare HMO | Admitting: Family Medicine

## 2022-11-06 VITALS — BP 120/68 | HR 85 | Temp 97.9°F | Ht 70.0 in | Wt 201.6 lb

## 2022-11-06 DIAGNOSIS — Z7984 Long term (current) use of oral hypoglycemic drugs: Secondary | ICD-10-CM

## 2022-11-06 DIAGNOSIS — D72829 Elevated white blood cell count, unspecified: Secondary | ICD-10-CM | POA: Diagnosis not present

## 2022-11-06 DIAGNOSIS — I10 Essential (primary) hypertension: Secondary | ICD-10-CM

## 2022-11-06 DIAGNOSIS — E1129 Type 2 diabetes mellitus with other diabetic kidney complication: Secondary | ICD-10-CM

## 2022-11-06 DIAGNOSIS — R809 Proteinuria, unspecified: Secondary | ICD-10-CM | POA: Diagnosis not present

## 2022-11-06 DIAGNOSIS — E785 Hyperlipidemia, unspecified: Secondary | ICD-10-CM

## 2022-11-06 DIAGNOSIS — N1832 Chronic kidney disease, stage 3b: Secondary | ICD-10-CM

## 2022-11-06 LAB — CBC WITH DIFFERENTIAL/PLATELET
Basophils Absolute: 0.1 10*3/uL (ref 0.0–0.1)
Basophils Relative: 0.8 % (ref 0.0–3.0)
Eosinophils Absolute: 0.2 10*3/uL (ref 0.0–0.7)
Eosinophils Relative: 2.1 % (ref 0.0–5.0)
HCT: 45 % (ref 39.0–52.0)
Hemoglobin: 15 g/dL (ref 13.0–17.0)
Lymphocytes Relative: 23.8 % (ref 12.0–46.0)
Lymphs Abs: 2.5 10*3/uL (ref 0.7–4.0)
MCHC: 33.3 g/dL (ref 30.0–36.0)
MCV: 82.5 fl (ref 78.0–100.0)
Monocytes Absolute: 0.8 10*3/uL (ref 0.1–1.0)
Monocytes Relative: 7.4 % (ref 3.0–12.0)
Neutro Abs: 6.9 10*3/uL (ref 1.4–7.7)
Neutrophils Relative %: 65.9 % (ref 43.0–77.0)
Platelets: 247 10*3/uL (ref 150.0–400.0)
RBC: 5.46 Mil/uL (ref 4.22–5.81)
RDW: 15.9 % — ABNORMAL HIGH (ref 11.5–15.5)
WBC: 10.5 10*3/uL (ref 4.0–10.5)

## 2022-11-06 LAB — COMPREHENSIVE METABOLIC PANEL
ALT: 15 U/L (ref 0–53)
AST: 17 U/L (ref 0–37)
Albumin: 4.7 g/dL (ref 3.5–5.2)
Alkaline Phosphatase: 79 U/L (ref 39–117)
BUN: 32 mg/dL — ABNORMAL HIGH (ref 6–23)
CO2: 28 mEq/L (ref 19–32)
Calcium: 10.4 mg/dL (ref 8.4–10.5)
Chloride: 99 mEq/L (ref 96–112)
Creatinine, Ser: 2.16 mg/dL — ABNORMAL HIGH (ref 0.40–1.50)
GFR: 28.04 mL/min — ABNORMAL LOW (ref >60.00)
Glucose, Bld: 167 mg/dL — ABNORMAL HIGH (ref 70–99)
Potassium: 4.8 mEq/L (ref 3.5–5.1)
Sodium: 135 mEq/L (ref 135–145)
Total Bilirubin: 0.4 mg/dL (ref 0.2–1.2)
Total Protein: 8.1 g/dL (ref 6.0–8.3)

## 2022-11-06 LAB — LIPID PANEL
Cholesterol: 109 mg/dL (ref 0–200)
HDL: 44.1 mg/dL (ref >39.00)
LDL Cholesterol: 32 mg/dL (ref 0–99)
NonHDL: 65.33
Total CHOL/HDL Ratio: 2
Triglycerides: 166 mg/dL — ABNORMAL HIGH (ref 0.0–149.0)
VLDL: 33.2 mg/dL (ref 0.0–40.0)

## 2022-11-06 NOTE — Patient Instructions (Addendum)
For constipation try adding fiber to diet - metamucil or citrucel. Avoid milk of magnesia.  Continue to drink fluids.  I will recheck kidney test today to see what meds we can use for diabetes. No changes for now - we will call you to discuss after I see labs.  I will recheck blood counts as one test elevated prior. Be seen if any fever or new infection/illness symptoms.  Take care.   Return to the clinic or go to the nearest emergency room if any of your symptoms worsen or new symptoms occur.

## 2022-11-06 NOTE — Progress Notes (Signed)
Subjective:  Patient ID: Gabriel Millman Sr., male    DOB: 09-26-1940  Age: 82 y.o. MRN: [id number]  CC:  Chief Complaint  Patient presents with   Diabetes    Pt states no concerns     HPI Gabriel E Mowbray Sr. presents for   Diabetes: With microalbuminuria, hyperglycemia, CKD.  Follow-up from April 4.  At that time on metformin 500 mg twice daily, diarrhea at higher dosing.  Jardiance 10 mg daily.  We have had to monitor his creatinine as that has intermittently increased and monitoring for ability to continue metformin or medication adjustments.  Goal A1c of under 7.5 at his age.  Unfortunately A1c increased on last labs at 8.4, and creatinine also increased to 1.97.  He has been evaluated by nephrology with plan for continued 8-month follow-up, possible hypovolemia, prerenal azotemia and avoidance for NSAIDs on prior elevated creatinine.  Dr. Valentino Nose, last appointment December 2023. No known follow up appt.  Jardiance  qd, metformin  BID.  No recent nsaids. Drinking water throughout the day - better than in the past.  Feeling ok. No new symptoms.  Home reading 168 today. Under 200 recently.   Lab Results  Component Value Date   HGBA1C 8.4 (H) 10/23/2022   HGBA1C 7.7 (H) 07/24/2022   HGBA1C 7.3 (H) 04/21/2022   Lab Results  Component Value Date   MICROALBUR 1.3 04/21/2022   LDLCALC 34 04/21/2022   CREATININE 1.97 (H) 10/23/2022  EGFR 31.32, down from 39 previously  BP Readings from Last 3 Encounters:  11/06/22 120/68  10/23/22 110/62  07/24/22 138/68    Leukocytosis Borderline on most recent CBC.   No infection symptoms.no fever/chills. Normal urination, no cough, no abd pain. Occasional constipation.  No treatment - miralax in past.  Lab Results  Component Value Date   WBC 11.4 (H) 10/23/2022   HGB 15.1 10/23/2022   HCT 45.1 10/23/2022   MCV 82.4 10/23/2022   PLT 259.0 10/23/2022     History Patient Active Problem List   Diagnosis Date  Noted   DM (diabetes mellitus) 05/12/2016   Hyperlipemia    Chest pain 04/12/2016   Essential hypertension 04/12/2016   Dyspepsia 04/12/2016    Past Medical History:  Diagnosis Date   Hypertension    Urolithiasis    Past stone spontaneously per patient.      Review of Systems Per HPI.   Objective:   Vitals:   11/06/22 1049  BP: 120/68  Pulse: 85  Temp: 97.9 F (36.6 C)  TempSrc: Temporal  SpO2: 96%  Weight: 201 lb 9.6 oz (91.4 kg)  Height:  (1.778 m)     Physical Exam Vitals reviewed.  Constitutional:      Appearance: He is well-developed.  HENT:     Head: Normocephalic and atraumatic.  Neck:     Vascular: No carotid bruit or JVD.  Cardiovascular:     Rate and Rhythm: Normal rate and regular rhythm.     Heart sounds: Normal heart sounds. No murmur heard. Pulmonary:     Effort: Pulmonary effort is normal.     Breath sounds: Normal breath sounds. No rales.  Musculoskeletal:     Right lower leg: No edema.     Left lower leg: No edema.  Skin:    General: Skin is warm and dry.  Neurological:     Mental Status: He is alert and oriented to person, place, and time.  Psychiatric:  Mood and Affect: Mood normal.        Assessment & Plan:  Gabriel DOUVILLE Sr. is a 82 y.o. male . Type 2 diabetes mellitus with microalbuminuria, without long-term current use of insulin -     Comprehensive metabolic panel  Leukocytosis, unspecified type -     CBC with Differential/Platelet  Essential hypertension -     Comprehensive metabolic panel  Stage 3b chronic kidney disease -     Comprehensive metabolic panel  Hyperlipidemia, unspecified hyperlipidemia type -     Lipid panel   Somewhat difficult situation.  Renal function has been worsening, which likely is impacting the efficacy of his Jardiance, and now with updated testing creatinine has increased further.  Based on current GFR will discontinue metformin temporarily.  Add glipizide for  improved glycemic control, especially off metformin.  Continue Jardiance although as above less effective with decreasing renal function.  Note sent to his nephrologist, and we will plan on repeat testing/evaluation next week.  Depending on that level may need more urgent office visit with nephrology.  Lipid panel, CBC stable.  Hypertension stable, no med changes otherwise for now.  Leukocytosis improved on recheck.  Constipation treatments discussed including fiber in diet and avoiding milk of magnesia given renal function.  Patient Instructions  For constipation try adding fiber to diet - metamucil or citrucel. Avoid milk of magnesia.  Continue to drink fluids.  I will recheck kidney test today to see what meds we can use for diabetes. No changes for now - we will call you to discuss after I see labs.  I will recheck blood counts as one test elevated prior. Be seen if any fever or new infection/illness symptoms.  Take care.   Return to the clinic or go to the nearest emergency room if any of your symptoms worsen or new symptoms occur.       Signed,   Meredith Staggers, MD [facility], [facility] 11/06/22 11:27 AM

## 2022-11-07 ENCOUNTER — Telehealth: Payer: Self-pay

## 2022-11-07 ENCOUNTER — Encounter: Payer: Self-pay | Admitting: Family Medicine

## 2022-11-07 ENCOUNTER — Other Ambulatory Visit: Payer: Self-pay | Admitting: Family Medicine

## 2022-11-07 MED ORDER — GLIPIZIDE 5 MG PO TABS: 5.0000 mg | ORAL_TABLET | Freq: Every day | ORAL | 1 refills | Status: DC

## 2022-11-07 NOTE — Telephone Encounter (Signed)
-----   Message from [name], MD sent at 11/07/2022  1:40 PM EDT ----- Call patient.  Unfortunately kidney function test has worsened.  Infection fighting cells, blood counts looked okay.  Cholesterol levels are stable. Stop metformin at this time given that kidney function test.  Can stay on Jardiance but it's effectiveness for blood sugar is decreased at this time due to his kidney function. Since stopping metformin and A1c was higher last time, start low dose glipizide once per day with first meal. Watch for low blood sugar symptoms and be seen if those occur. Do not skip meals. Recheck with me next week, but will also send his nephrologist a message. If any new symptoms over the weekend go to ER.

## 2022-11-07 NOTE — Telephone Encounter (Signed)
Patient was informed of lab results but needed to make an appointment and asked to call back to schedule he should be scheduled week of 11/10/2022

## 2022-11-07 NOTE — Progress Notes (Signed)
See lab notes, discontinuing metformin, start low-dose glipizide, continue Jardiance.

## 2022-11-10 ENCOUNTER — Ambulatory Visit (INDEPENDENT_AMBULATORY_CARE_PROVIDER_SITE_OTHER): Payer: Medicare HMO | Admitting: Family Medicine

## 2022-11-10 ENCOUNTER — Encounter: Payer: Self-pay | Admitting: Family Medicine

## 2022-11-10 VITALS — BP 110/58 | HR 99 | Temp 98.7°F | Ht 70.0 in | Wt 202.0 lb

## 2022-11-10 DIAGNOSIS — E1129 Type 2 diabetes mellitus with other diabetic kidney complication: Secondary | ICD-10-CM | POA: Diagnosis not present

## 2022-11-10 DIAGNOSIS — I10 Essential (primary) hypertension: Secondary | ICD-10-CM

## 2022-11-10 DIAGNOSIS — N1832 Chronic kidney disease, stage 3b: Secondary | ICD-10-CM | POA: Diagnosis not present

## 2022-11-10 DIAGNOSIS — R7989 Other specified abnormal findings of blood chemistry: Secondary | ICD-10-CM

## 2022-11-10 DIAGNOSIS — R809 Proteinuria, unspecified: Secondary | ICD-10-CM | POA: Diagnosis not present

## 2022-11-10 MED ORDER — LISINOPRIL 20 MG PO TABS
20.0000 mg | ORAL_TABLET | Freq: Every day | ORAL | 2 refills | Status: DC
Start: 2022-11-10 — End: 2023-02-03

## 2022-11-10 NOTE — Patient Instructions (Addendum)
Stop metformin for now.  Continue glipizide with meal and jardiance for now. If any low blood sugars let me know right away.  I will recheck kidney test today, keep follow up with nephrology this week as planned. I will stop the HCTZ part of your blood pressure med for now. Start lisinopril  per day (do not take combination pill for now).  Return to the clinic or go to the nearest emergency room if any of your symptoms worsen or new symptoms occur.  Thanks for coming back in today.

## 2022-11-10 NOTE — Progress Notes (Signed)
Subjective:  Patient ID: Gabriel Millman Sr., male    DOB: Jul 17, 1941  Age: 82 y.o. MRN: [id number]  CC:  Chief Complaint  Patient presents with   Follow-up    Follow up to discuss recent lab work     HPI [employer] Sr. presents for   Diabetes, CKD.  With microalbuminuria, hyperglycemia, CKD.  Treated with metformin, Jardiance.  Has been seen by nephrology in December, plan for 19-month follow-up.  Discussed renal function last visit, denies recent NSAIDs and was drinking sufficient water throughout the day. Unfortunately renal function worsened on recent labs last week.  Creatinine is increased from 1.97 on April 4 to 2.16 on the 18th.  With EGFR of 28.  Glucose 167.  With this declining GFR metformin was discontinued, started on low-dose glipizide 5 mg daily, continued on Jardiance although with decreasing GFR question effectiveness for diabetic control. No change in antihypertensives, still on lisinopril HCTZ 20.12.5mg  daily. Notified nephrologist with plan last week, plan for repeat testing this week to decide on more urgent eval if needed - appt with nephrology this Thursday.  Feeling ok. Urinating normally. No fatigue, HA or other new symptoms. Still avoiding nsaids and staying hydrated.  Still taking metformin - was not aware he needed to stop.  Started glipizide a few days ago.  Home readings - 121 this morning, similar this weekend.  Low 100's, no symptomatic lows. Lowest 121.  Home BP 120/84 today. Similar readings recently - no lows. On lisinopril/hctz 20/12.5mg  qd.  BP Readings from Last 3 Encounters:  11/10/22 (!) 110/58  11/06/22 120/68  10/23/22 110/62      Lab Results  Component Value Date   HGBA1C 8.4 (H) 10/23/2022   HGBA1C 7.7 (H) 07/24/2022   HGBA1C 7.3 (H) 04/21/2022   Lab Results  Component Value Date   MICROALBUR 1.3 04/21/2022   LDLCALC 32 11/06/2022   CREATININE 2.16 (H) 11/06/2022     History Patient Active Problem List    Diagnosis Date Noted   DM (diabetes mellitus) 05/12/2016   Hyperlipemia    Chest pain 04/12/2016   Essential hypertension 04/12/2016   Dyspepsia 04/12/2016   Past Medical History:  Diagnosis Date   Hypertension    Urolithiasis    Past stone spontaneously per patient.   History reviewed. No pertinent surgical history. No Known Allergies Prior to Admission medications   Medication Sig Start Date End Date Taking? Authorizing Provider  aspirin 81 MG EC tablet Take 1 tablet (81 mg total) by mouth daily. 08/29/19  Yes [name], MD  atorvastatin (LIPITOR) 40 MG tablet TAKE 1 TABLET BY MOUTH ONCE DAILY AT  6  PM. 10/23/22  Yes [name], MD  blood glucose meter kit and supplies Dispense based on patient and insurance preference. Use once per day and if symptoms of low blood sugar. (FOR ICD-10 E10.9, E11.9). 01/01/22  Yes [name], MD  empagliflozin (JARDIANCE) 10 MG TABS tablet Take 1 tablet (10 mg total) by mouth daily. 10/23/22  Yes [name], MD  fluticasone (FLONASE) 50 MCG/ACT nasal spray Place 2 sprays into both nostrils daily. 04/21/22  Yes [name], MD  glipiZIDE (GLUCOTROL) 5 MG tablet Take 1 tablet (5 mg total) by mouth daily before breakfast. 11/07/22  Yes [name], MD  lisinopril-hydrochlorothiazide (ZESTORETIC) 20-12.5 MG tablet Take 1 tablet by mouth daily. 10/23/22  Yes [name], MD  omeprazole (PRILOSEC) 20 MG capsule Take 1 capsule by mouth  once daily 09/23/22  Yes [name], MD   Social History   Socioeconomic History   Marital status: Single    Spouse name: Not on file   Number of children: 2   Years of education: Not on file   Highest education level: Not on file  Occupational History   Occupation: retired  Tobacco Use   Smoking status: Former    Types: Cigars    Quit date: 07/21/2010    Years since quitting: 12.3   Smokeless tobacco: Never  Vaping Use   Vaping Use: Never used  Substance and Sexual Activity    Alcohol use: No   Drug use: No   Sexual activity: Never  Other Topics Concern   Not on file  Social History Narrative   Lives alone - sons live in [place]   Social Determinants of Health   Financial Resource Strain: Low Risk  (08/29/2021)   Overall Financial Resource Strain (CARDIA)    Difficulty of Paying Living Expenses: Not hard at all  Food Insecurity: No Food Insecurity (08/29/2021)   Hunger Vital Sign    Worried About Running Out of Food in the Last Year: Never true    Ran Out of Food in the Last Year: Never true  Transportation Needs: No Transportation Needs (08/29/2021)   PRAPARE - Administrator, Civil Service (Medical): No    Lack of Transportation (Non-Medical): No  Physical Activity: Insufficiently Active (08/29/2021)   Exercise Vital Sign    Days of Exercise per Week: 4 days    Minutes of Exercise per Session: 30 min  Stress: No Stress Concern Present (08/29/2021)   [employer] of Occupational Health - Occupational Stress Questionnaire    Feeling of Stress : Not at all  Social Connections: Socially Isolated (08/29/2021)   Social Connection and Isolation Panel [NHANES]    Frequency of Communication with Friends and Family: More than three times a week    Frequency of Social Gatherings with Friends and Family: More than three times a week    Attends Religious Services: Never    Database administrator or Organizations: No    Attends Banker Meetings: Never    Marital Status: Divorced  Catering manager Violence: Not At Risk (08/29/2021)   Humiliation, Afraid, Rape, and Kick questionnaire    Fear of Current or Ex-Partner: No    Emotionally Abused: No    Physically Abused: No    Sexually Abused: No    Review of Systems Per HPI.   Objective:   Vitals:   11/10/22 1417  BP: (!) 110/58  Pulse: 99  Temp: 98.7 F (37.1 C)  TempSrc: Temporal  SpO2: 96%  Weight: 202 lb (91.6 kg)  Height: 5\' 10"  (1.778 m)     Physical Exam Vitals  reviewed.  Constitutional:      Appearance: He is well-developed.  HENT:     Head: Normocephalic and atraumatic.  Neck:     Vascular: No carotid bruit or JVD.  Cardiovascular:     Rate and Rhythm: Normal rate and regular rhythm.     Heart sounds: Normal heart sounds. No murmur heard. Pulmonary:     Effort: Pulmonary effort is normal.     Breath sounds: Normal breath sounds. No rales.  Musculoskeletal:     Right lower leg: No edema.     Left lower leg: No edema.  Skin:    General: Skin is warm and dry.  Neurological:  Mental Status: He is alert and oriented to person, place, and time.  Psychiatric:        Mood and Affect: Mood normal.        Assessment & Plan:  ROLLEN SELDERS Sr. is a 82 y.o. male . Stage 3b chronic kidney disease - Plan: Basic metabolic panel, lisinopril (ZESTRIL) 20 MG tablet  Elevated serum creatinine - Plan: Basic metabolic panel, lisinopril (ZESTRIL) 20 MG tablet  Type 2 diabetes mellitus with microalbuminuria, without long-term current use of insulin - Plan: Basic metabolic panel  Essential hypertension - Plan: lisinopril (ZESTRIL) 20 MG tablet  Possible AKI on CKD versus worsening CKD.   Will continue same dose ACE inhibitor for nephro protection as well as SGLT2 inhibitor, borderline low BP, stop HCTZ for now, check BMP, avoid nephrotoxins, follow-up with nephrology as planned this week.  Diabetes has continued metformin, advised to stop at this time.  Denies any symptomatic hypoglycemia, but cautioned on possibility with sulfonylurea.  Will continue glipizide for now given decreased renal function and likely decreasing effectiveness of Jardiance and need to stop metformin.  1 month follow-up.   Meds ordered this encounter  Medications   lisinopril (ZESTRIL) 20 MG tablet    Sig: Take 1 tablet (20 mg total) by mouth daily.    Dispense:  30 tablet    Refill:  2   Patient Instructions  Stop metformin for now.  Continue glipizide with  meal and jardiance for now. If any low blood sugars let me know right away.  I will recheck kidney test today, keep follow up with nephrology this week as planned. I will stop the HCTZ part of your blood pressure med for now. Start lisinopril  per day (do not take combination pill for now).  Return to the clinic or go to the nearest emergency room if any of your symptoms worsen or new symptoms occur.  Thanks for coming back in today.       Signed,   Meredith Staggers, MD [facility], [facility] 11/10/22 3:35 PM

## 2022-11-11 LAB — BASIC METABOLIC PANEL
BUN: 25 mg/dL — ABNORMAL HIGH (ref 6–23)
CO2: 27 mEq/L (ref 19–32)
Calcium: 9.9 mg/dL (ref 8.4–10.5)
Chloride: 100 mEq/L (ref 96–112)
Creatinine, Ser: 1.91 mg/dL — ABNORMAL HIGH (ref 0.40–1.50)
GFR: 32.49 mL/min — ABNORMAL LOW (ref >60.00)
Glucose, Bld: 91 mg/dL (ref 70–99)
Potassium: 4.4 mEq/L (ref 3.5–5.1)
Sodium: 137 mEq/L (ref 135–145)

## 2022-11-12 ENCOUNTER — Telehealth: Payer: Self-pay

## 2022-11-12 NOTE — Telephone Encounter (Signed)
-----   Message from [name], MD sent at 11/11/2022  4:44 PM EDT ----- Call patient.  Kidney function test is stable and a few points improved from previous reading.  Stopping hydrochlorothiazide should help.  Other labs are stable.  Make sure he has stopped the metformin as blood sugar was a little bit lower at 91.  Watch for any low blood sugar symptoms and stop the glipizide if that occurs.  Let me know if there are questions.

## 2022-11-12 NOTE — Telephone Encounter (Signed)
Pt notes stopped metformin yesterday, feeling well today

## 2022-11-13 DIAGNOSIS — N1832 Chronic kidney disease, stage 3b: Secondary | ICD-10-CM | POA: Diagnosis not present

## 2022-12-10 ENCOUNTER — Ambulatory Visit: Payer: Medicare HMO | Admitting: Family Medicine

## 2022-12-12 DIAGNOSIS — N1832 Chronic kidney disease, stage 3b: Secondary | ICD-10-CM | POA: Diagnosis not present

## 2022-12-13 LAB — LAB REPORT - SCANNED: EGFR (Non-African Amer.): 37

## 2022-12-18 ENCOUNTER — Ambulatory Visit (INDEPENDENT_AMBULATORY_CARE_PROVIDER_SITE_OTHER): Payer: Medicare HMO | Admitting: Family Medicine

## 2022-12-18 VITALS — BP 132/74 | HR 74 | Temp 98.3°F | Ht 70.0 in | Wt 204.4 lb

## 2022-12-18 DIAGNOSIS — R7989 Other specified abnormal findings of blood chemistry: Secondary | ICD-10-CM

## 2022-12-18 DIAGNOSIS — N1832 Chronic kidney disease, stage 3b: Secondary | ICD-10-CM | POA: Diagnosis not present

## 2022-12-18 DIAGNOSIS — E1129 Type 2 diabetes mellitus with other diabetic kidney complication: Secondary | ICD-10-CM | POA: Diagnosis not present

## 2022-12-18 DIAGNOSIS — R809 Proteinuria, unspecified: Secondary | ICD-10-CM

## 2022-12-18 LAB — GLUCOSE, POCT (MANUAL RESULT ENTRY): POC Glucose: 152 mg/dl — AB (ref 70–99)

## 2022-12-18 NOTE — Progress Notes (Signed)
Subjective:  Patient ID: Gabriel Millman Sr., male    DOB: [birth year]  Age: 82 y.o. MRN: [id number]  CC:  Chief Complaint  Patient presents with   Diabetes    Pt here for 6 weeks 99 BG fasting this morning has been as low as 78 fasting in previous few weeks     HPI Gabriel E Froman Sr. presents for   Diabetes: With microalbuminuria, hyperglycemia, CKD treated with metformin and Jardiance.  Followed by nephrology. Renal function had worsened on his labs in April, creatinine had increased from 1.97 on April 4 to 2.16 on April 18 with EGFR of 28.  With his declining GFR metformin was discontinued and started on low-dose glipizide 5 mg daily, continued on Jardiance although with decreasing GFR question effectiveness for diabetic control.  Had appointment with nephrology after my last visit.  Unfortunately was still taking metformin at that time and stopped on the April 22. Low 100s on home blood sugars on glipizide.  No symptomatic lows as of last visit and creatinine has slightly improved on 22 April to 1.91 with EGFR 32.49  I am unable to see most recent nephrology visit. Had lab for nephrology on 5/24 - has not yet heard results. Has appt with nephrology   Has stopped metformin.  Home readings - 99 this morning, asymptomatic and no prior lower readings. Usually in mid 100's. Taking glipizide 5mg  qd, jardiance 10mg  qd, still on lisinopril 20mg  qd.  No symptomatic low readings. No 200's.  No nsaids.  Drinking water.  No difficulty urinating, lightheadedness/dizziness. No new symptoms, feels fine.   Lab Results  Component Value Date   HGBA1C 8.4 (H) 10/23/2022   HGBA1C 7.7 (H) 07/24/2022   HGBA1C 7.3 (H) 04/21/2022   Lab Results  Component Value Date   MICROALBUR 1.3 04/21/2022   LDLCALC 32 11/06/2022   CREATININE 1.91 (H) 11/10/2022    History Patient Active Problem List   Diagnosis Date Noted   DM (diabetes mellitus) (HCC) 05/12/2016   Hyperlipemia    Chest  pain 04/12/2016   Essential hypertension 04/12/2016   Dyspepsia 04/12/2016   Past Medical History:  Diagnosis Date   Hypertension    Urolithiasis    Past stone spontaneously per patient.   No past surgical history on file. No Known Allergies Prior to Admission medications   Medication Sig Start Date End Date Taking? Authorizing Provider  aspirin 81 MG EC tablet Take 1 tablet (81 mg total) by mouth daily. 08/29/19  Yes [name], MD  atorvastatin (LIPITOR) 40 MG tablet TAKE 1 TABLET BY MOUTH ONCE DAILY AT  6  PM. 10/23/22  Yes [name], MD  blood glucose meter kit and supplies Dispense based on patient and insurance preference. Use once per day and if symptoms of low blood sugar. (FOR ICD-10 E10.9, E11.9). 01/01/22  Yes [name], MD  empagliflozin (JARDIANCE) 10 MG TABS tablet Take 1 tablet (10 mg total) by mouth daily. 10/23/22  Yes [name], MD  fluticasone (FLONASE) 50 MCG/ACT nasal spray Place 2 sprays into both nostrils daily. 04/21/22  Yes [name], MD  glipiZIDE (GLUCOTROL) 5 MG tablet Take 1 tablet (5 mg total) by mouth daily before breakfast. 11/07/22  Yes [name], MD  lisinopril (ZESTRIL) 20 MG tablet Take 1 tablet (20 mg total) by mouth daily. 11/10/22  Yes [name], MD  omeprazole (PRILOSEC) 20 MG capsule Take 1 capsule by mouth once daily 09/23/22  Yes [name], MD   Social History   Socioeconomic History   Marital status: Single    Spouse name: Not on file   Number of children: 2   Years of education: Not on file   Highest education level: Not on file  Occupational History   Occupation: retired  Tobacco Use   Smoking status: Former    Types: Cigars    Quit date: 07/21/2010    Years since quitting: 12.4   Smokeless tobacco: Never  Vaping Use   Vaping Use: Never used  Substance and Sexual Activity   Alcohol use: No   Drug use: No   Sexual activity: Never  Other Topics Concern   Not on file  Social  History Narrative   Lives alone - sons live in [place]   Social Determinants of Health   Financial Resource Strain: Low Risk  (08/29/2021)   Overall Financial Resource Strain (CARDIA)    Difficulty of Paying Living Expenses: Not hard at all  Food Insecurity: No Food Insecurity (08/29/2021)   Hunger Vital Sign    Worried About Running Out of Food in the Last Year: Never true    Ran Out of Food in the Last Year: Never true  Transportation Needs: No Transportation Needs (08/29/2021)   PRAPARE - Administrator, Civil Service (Medical): No    Lack of Transportation (Non-Medical): No  Physical Activity: Insufficiently Active (08/29/2021)   Exercise Vital Sign    Days of Exercise per Week: 4 days    Minutes of Exercise per Session: 30 min  Stress: No Stress Concern Present (08/29/2021)   [employer] of Occupational Health - Occupational Stress Questionnaire    Feeling of Stress : Not at all  Social Connections: Socially Isolated (08/29/2021)   Social Connection and Isolation Panel [NHANES]    Frequency of Communication with Friends and Family: More than three times a week    Frequency of Social Gatherings with Friends and Family: More than three times a week    Attends Religious Services: Never    Database administrator or Organizations: No    Attends Banker Meetings: Never    Marital Status: Divorced  Catering manager Violence: Not At Risk (08/29/2021)   Humiliation, Afraid, Rape, and Kick questionnaire    Fear of Current or Ex-Partner: No    Emotionally Abused: No    Physically Abused: No    Sexually Abused: No    Review of Systems Per HPI.   Objective:   Vitals:   12/18/22 1400  BP: 132/74  Pulse: 74  Temp: 98.3 F (36.8 C)  TempSrc: Temporal  SpO2: 97%  Weight: 204 lb 6.4 oz (92.7 kg)  Height: 5\' 10"  (1.778 m)     Physical Exam Vitals reviewed.  Constitutional:      Appearance: He is well-developed.  HENT:     Head: Normocephalic and  atraumatic.  Neck:     Vascular: No carotid bruit or JVD.  Cardiovascular:     Rate and Rhythm: Normal rate and regular rhythm.     Heart sounds: Normal heart sounds. No murmur heard. Pulmonary:     Effort: Pulmonary effort is normal.     Breath sounds: Normal breath sounds. No rales.  Musculoskeletal:     Right lower leg: No edema.     Left lower leg: No edema.  Skin:    General: Skin is warm and dry.  Neurological:     Mental Status: He  is alert and oriented to person, place, and time.  Psychiatric:        Mood and Affect: Mood normal.        Assessment & Plan:  EATHIN KIRKBY Sr. is a 82 y.o. male . Type 2 diabetes mellitus with microalbuminuria, without long-term current use of insulin (HCC) - Plan: POCT Glucose (CBG)  Elevated serum creatinine  Stage 3b chronic kidney disease (HCC)  Hold on changes to diabetes at this time given variable readings at home, ER/RTC precautions if hypoglycemia.  Recent labs reportedly from nephrology, will request those to determine if other blood work needed with me in follow-up in July as planned.  Addendum, 12/21/2022 Outside labs reviewed, creatinine 1.8, glucose 252, EGFR 37 on 12/12/2022.    No orders of the defined types were placed in this encounter.  Patient Instructions  Okay to continue same medications for now but watch your blood sugars and if you have any further readings below 100,  I need to know.  If you have any low readings and do not feel well you should be seen right away.  I will check for the recent labs from nephrology, keep follow with me in July.  Let me know if there are questions.    Signed,   Meredith Staggers, MD  Primary Care, [facility] 12/21/22 1:24 PM

## 2022-12-18 NOTE — Patient Instructions (Signed)
Okay to continue same medications for now but watch your blood sugars and if you have any further readings below 100,  I need to know.  If you have any low readings and do not feel well you should be seen right away.  I will check for the recent labs from nephrology, keep follow with me in July.  Let me know if there are questions.

## 2022-12-21 ENCOUNTER — Encounter: Payer: Self-pay | Admitting: Family Medicine

## 2022-12-23 ENCOUNTER — Other Ambulatory Visit: Payer: Self-pay

## 2022-12-23 ENCOUNTER — Other Ambulatory Visit: Payer: Self-pay | Admitting: Family Medicine

## 2022-12-23 DIAGNOSIS — K219 Gastro-esophageal reflux disease without esophagitis: Secondary | ICD-10-CM

## 2022-12-23 MED ORDER — OMEPRAZOLE 20 MG PO CPDR
20.0000 mg | DELAYED_RELEASE_CAPSULE | Freq: Every day | ORAL | 0 refills | Status: DC
Start: 2022-12-23 — End: 2023-03-27

## 2022-12-23 NOTE — Telephone Encounter (Signed)
   omeprazole (PRILOSEC) 20 MG capsul    Last office visit 12/18/2022 Last refill 09/23/2022 90 days Upcoming appt 01/28/2023

## 2023-01-05 ENCOUNTER — Other Ambulatory Visit: Payer: Self-pay | Admitting: Family Medicine

## 2023-01-28 ENCOUNTER — Ambulatory Visit: Payer: Medicare HMO | Admitting: Family Medicine

## 2023-01-28 DIAGNOSIS — I129 Hypertensive chronic kidney disease with stage 1 through stage 4 chronic kidney disease, or unspecified chronic kidney disease: Secondary | ICD-10-CM | POA: Diagnosis not present

## 2023-01-28 DIAGNOSIS — N1832 Chronic kidney disease, stage 3b: Secondary | ICD-10-CM | POA: Diagnosis not present

## 2023-01-28 DIAGNOSIS — E1122 Type 2 diabetes mellitus with diabetic chronic kidney disease: Secondary | ICD-10-CM | POA: Diagnosis not present

## 2023-01-29 LAB — LAB REPORT - SCANNED
Creatinine, POC: 91.8 mg/dL
EGFR: 41

## 2023-02-02 DIAGNOSIS — N390 Urinary tract infection, site not specified: Secondary | ICD-10-CM | POA: Diagnosis not present

## 2023-02-03 ENCOUNTER — Other Ambulatory Visit: Payer: Self-pay | Admitting: Family Medicine

## 2023-02-03 DIAGNOSIS — N1832 Chronic kidney disease, stage 3b: Secondary | ICD-10-CM

## 2023-02-03 DIAGNOSIS — I10 Essential (primary) hypertension: Secondary | ICD-10-CM

## 2023-02-03 DIAGNOSIS — R7989 Other specified abnormal findings of blood chemistry: Secondary | ICD-10-CM

## 2023-02-04 ENCOUNTER — Encounter: Payer: Self-pay | Admitting: Family Medicine

## 2023-02-04 ENCOUNTER — Ambulatory Visit (INDEPENDENT_AMBULATORY_CARE_PROVIDER_SITE_OTHER): Payer: Medicare HMO | Admitting: Family Medicine

## 2023-02-04 VITALS — BP 134/78 | HR 103 | Temp 98.0°F | Ht 70.0 in | Wt 204.0 lb

## 2023-02-04 DIAGNOSIS — E1129 Type 2 diabetes mellitus with other diabetic kidney complication: Secondary | ICD-10-CM | POA: Diagnosis not present

## 2023-02-04 DIAGNOSIS — R809 Proteinuria, unspecified: Secondary | ICD-10-CM | POA: Diagnosis not present

## 2023-02-04 DIAGNOSIS — R7989 Other specified abnormal findings of blood chemistry: Secondary | ICD-10-CM

## 2023-02-04 DIAGNOSIS — M79645 Pain in left finger(s): Secondary | ICD-10-CM | POA: Diagnosis not present

## 2023-02-04 DIAGNOSIS — N1832 Chronic kidney disease, stage 3b: Secondary | ICD-10-CM | POA: Diagnosis not present

## 2023-02-04 DIAGNOSIS — I10 Essential (primary) hypertension: Secondary | ICD-10-CM | POA: Diagnosis not present

## 2023-02-04 MED ORDER — LISINOPRIL 20 MG PO TABS
20.0000 mg | ORAL_TABLET | Freq: Every day | ORAL | 1 refills | Status: DC
Start: 2023-02-04 — End: 2023-08-07

## 2023-02-04 MED ORDER — GLIPIZIDE 5 MG PO TABS: 5.0000 mg | ORAL_TABLET | Freq: Every day | ORAL | 1 refills | Status: DC

## 2023-02-04 NOTE — Progress Notes (Signed)
Subjective:  Patient ID: Gabriel Millman Sr., male    DOB: 08-Aug-1940  Age: 82 y.o. MRN: [id number]  CC:  Chief Complaint  Patient presents with   Medical Management of Chronic Issues    Pt is doing well   Hand Pain    Pt notes patient complains of thumb pain for     HPI Gabriel E Goodine Sr. presents for   Thumb pain: Left thumb - past few months. Triggering, popping initially, now hard to bend.  No known injury.  L hand dominant.  Tx: arthritis cream - helped some. No prior issues. Using brace to keep from snapping.   Diabetes: With microalbuminuria, hyperglycemia, CKD - followed by nephrology.  Metformin was discontinued with decreasing GFR and started on low-dose glipizide.  Continued on Jardiance.  Cautioned on possible hypoglycemia at his last visit May 30 but no true hypoglycemia.  Was taking glipizide 5 mg daily, Jardiance 10 mg daily, and remained on lisinopril 20 mg daily at that time.  Labs have been followed by nephrology. He is on statin. Home readings: 137 this morning.  No 200's, no sx lows. No recent metformin.  Saw nephrology on 7/10 - had some bloodwork and recent urine test.  Microalbumin: Normal ratio 04/21/2022 Optho, foot exam, pneumovax:  Diabetic Foot Exam - Simple   No data filed      Lab Results  Component Value Date   HGBA1C 8.4 (H) 10/23/2022   HGBA1C 7.7 (H) 07/24/2022   HGBA1C 7.3 (H) 04/21/2022   Lab Results  Component Value Date   MICROALBUR 1.3 04/21/2022   LDLCALC 32 11/06/2022   CREATININE 1.91 (H) 11/10/2022     History Patient Active Problem List   Diagnosis Date Noted   DM (diabetes mellitus) (HCC) 05/12/2016   Hyperlipemia    Chest pain 04/12/2016   Essential hypertension 04/12/2016   Dyspepsia 04/12/2016   Past Medical History:  Diagnosis Date   Hypertension    Urolithiasis    Past stone spontaneously per patient.   No past surgical history on file. No Known Allergies Prior to Admission medications    Medication Sig Start Date End Date Taking? Authorizing Provider  aspirin 81 MG EC tablet Take 1 tablet (81 mg total) by mouth daily. 08/29/19  Yes [name], MD  atorvastatin (LIPITOR) 40 MG tablet TAKE 1 TABLET BY MOUTH ONCE DAILY AT  6  PM. 10/23/22  Yes [name], MD  blood glucose meter kit and supplies Dispense based on patient and insurance preference. Use once per day and if symptoms of low blood sugar. (FOR ICD-10 E10.9, E11.9). 01/01/22  Yes [name], MD  empagliflozin (JARDIANCE) 10 MG TABS tablet Take 1 tablet (10 mg total) by mouth daily. 10/23/22  Yes [name], MD  fluticasone (FLONASE) 50 MCG/ACT nasal spray Place 2 sprays into both nostrils daily. 04/21/22  Yes [name], MD  glipiZIDE (GLUCOTROL) 5 MG tablet TAKE 1 TABLET BY MOUTH ONCE DAILY BEFORE BREAKFAST 02/03/23  Yes [name], MD  lisinopril (ZESTRIL) 20 MG tablet Take 1 tablet by mouth once daily 02/03/23  Yes [name], MD  omeprazole (PRILOSEC) 20 MG capsule Take 1 capsule by mouth once daily 12/23/22  Yes Alveria Apley, NP  omeprazole (PRILOSEC) 20 MG capsule Take 1 capsule (20 mg total) by mouth daily. 12/23/22  Yes [name], MD   Social History   Socioeconomic History   Marital status: Single  Spouse name: Not on file   Number of children: 2   Years of education: Not on file   Highest education level: Not on file  Occupational History   Occupation: retired  Tobacco Use   Smoking status: Former    Types: Cigars    Quit date: 07/21/2010    Years since quitting: 12.5   Smokeless tobacco: Never  Vaping Use   Vaping status: Never Used  Substance and Sexual Activity   Alcohol use: No   Drug use: No   Sexual activity: Never  Other Topics Concern   Not on file  Social History Narrative   Lives alone - sons live in [place]   Social Determinants of Health   Financial Resource Strain: Low Risk  (08/29/2021)   Overall Financial Resource Strain  (CARDIA)    Difficulty of Paying Living Expenses: Not hard at all  Food Insecurity: No Food Insecurity (08/29/2021)   Hunger Vital Sign    Worried About Running Out of Food in the Last Year: Never true    Ran Out of Food in the Last Year: Never true  Transportation Needs: No Transportation Needs (08/29/2021)   PRAPARE - Administrator, Civil Service (Medical): No    Lack of Transportation (Non-Medical): No  Physical Activity: Insufficiently Active (08/29/2021)   Exercise Vital Sign    Days of Exercise per Week: 4 days    Minutes of Exercise per Session: 30 min  Stress: No Stress Concern Present (08/29/2021)   [employer] of Occupational Health - Occupational Stress Questionnaire    Feeling of Stress : Not at all  Social Connections: Socially Isolated (08/29/2021)   Social Connection and Isolation Panel [NHANES]    Frequency of Communication with Friends and Family: More than three times a week    Frequency of Social Gatherings with Friends and Family: More than three times a week    Attends Religious Services: Never    Database administrator or Organizations: No    Attends Banker Meetings: Never    Marital Status: Divorced  Catering manager Violence: Not At Risk (08/29/2021)   Humiliation, Afraid, Rape, and Kick questionnaire    Fear of Current or Ex-Partner: No    Emotionally Abused: No    Physically Abused: No    Sexually Abused: No    Review of Systems  Constitutional:  Negative for fatigue and unexpected weight change.  Eyes:  Negative for visual disturbance.  Respiratory:  Negative for cough, chest tightness and shortness of breath.   Cardiovascular:  Negative for chest pain, palpitations and leg swelling.  Gastrointestinal:  Negative for abdominal pain and blood in stool.  Neurological:  Negative for dizziness, light-headedness and headaches.     Objective:   Vitals:   02/04/23 1428  BP: 134/78  Pulse: (!) 103  Temp: 98 F (36.7 C)  TempSrc:  Temporal  SpO2: 97%  Weight: 204 lb (92.5 kg)  Height: 5\' 10"  (1.778 m)     Physical Exam Vitals reviewed.  Constitutional:      Appearance: He is well-developed.  HENT:     Head: Normocephalic and atraumatic.  Neck:     Vascular: No carotid bruit or JVD.  Cardiovascular:     Rate and Rhythm: Normal rate and regular rhythm.     Heart sounds: Normal heart sounds. No murmur heard. Pulmonary:     Effort: Pulmonary effort is normal.     Breath sounds: Normal breath sounds. No rales.  Musculoskeletal:     Right lower leg: No edema.     Left lower leg: No edema.     Comments: Left hand, thumb with triggering, locking about the IP.  Tender over IP, proximal phalanx to the MCP with possible bony prominence.  Skin intact without appreciable soft tissue swelling or erythema.  Neurovascular intact distally.  Skin:    General: Skin is warm and dry.  Neurological:     Mental Status: He is alert and oriented to person, place, and time.  Psychiatric:        Mood and Affect: Mood normal.        Assessment & Plan:  TYRIK STETZER Sr. is a 82 y.o. male . Thumb pain, left - Plan: DG Finger Thumb Left, Ambulatory referral to Hand Surgery  -Trigger finger/thumb versus degenerative joint disease.  Progressive discomfort and decreased mobility.  Refer to hand specialist, check imaging.  Okay to splint for now and topical products fine.  Avoid oral NSAIDs given CKD.  Type 2 diabetes mellitus with microalbuminuria, without long-term current use of insulin (HCC) - Plan: Hemoglobin A1c Stage 3b chronic kidney disease (HCC) - Plan: Comprehensive metabolic panel  -Check updated A1c, home readings overall stable.  Adjust plan accordingly.  Continue follow-up with nephrology.  No orders of the defined types were placed in this encounter.  Patient Instructions  I will refer you to hand specialist for either trigger finger or possible arthritis of the thumb but please have x-ray performed at  the location below.  No medication changes for now.  Recheck in 3 months, keep follow-up with your kidney specialist.  Take care.   [facility] or xray: Walk in 8:30-4:30 during weekdays, no appointment needed [address]  [place], [place] [address]     Signed,   Meredith Staggers, MD  Primary Care, [facility] 02/04/23 3:18 PM

## 2023-02-04 NOTE — Patient Instructions (Signed)
I will refer you to hand specialist for either trigger finger or possible arthritis of the thumb but please have x-ray performed at the location below.  No medication changes for now.  Recheck in 3 months, keep follow-up with your kidney specialist.  Take care.   [facility] or xray: Walk in 8:30-4:30 during weekdays, no appointment needed [address]  [place], [place] [address]

## 2023-02-05 LAB — COMPREHENSIVE METABOLIC PANEL
ALT: 17 U/L (ref 0–53)
AST: 18 U/L (ref 0–37)
Albumin: 4.5 g/dL (ref 3.5–5.2)
Alkaline Phosphatase: 101 U/L (ref 39–117)
BUN: 22 mg/dL (ref 6–23)
CO2: 27 mEq/L (ref 19–32)
Calcium: 10.3 mg/dL (ref 8.4–10.5)
Chloride: 103 mEq/L (ref 96–112)
Creatinine, Ser: 1.81 mg/dL — ABNORMAL HIGH (ref 0.40–1.50)
GFR: 34.6 mL/min — ABNORMAL LOW (ref >60.00)
Glucose, Bld: 209 mg/dL — ABNORMAL HIGH (ref 70–99)
Potassium: 4.5 mEq/L (ref 3.5–5.1)
Sodium: 136 mEq/L (ref 135–145)
Total Bilirubin: 0.4 mg/dL (ref 0.2–1.2)
Total Protein: 8.1 g/dL (ref 6.0–8.3)

## 2023-02-05 LAB — HEMOGLOBIN A1C: Hgb A1c MFr Bld: 8.2 % — ABNORMAL HIGH (ref 4.6–6.5)

## 2023-02-07 ENCOUNTER — Other Ambulatory Visit: Payer: Self-pay | Admitting: Family Medicine

## 2023-02-07 NOTE — Progress Notes (Signed)
Disregard

## 2023-02-17 ENCOUNTER — Telehealth: Payer: Self-pay

## 2023-02-17 ENCOUNTER — Other Ambulatory Visit: Payer: Self-pay

## 2023-02-17 MED ORDER — GLIPIZIDE 2.5 MG PO TABS: 2.5000 mg | ORAL_TABLET | Freq: Every day | ORAL | 1 refills | Status: DC

## 2023-02-17 NOTE — Telephone Encounter (Signed)
Patient was advised of results and the additional med has been sent in to requested pharmacy and he was clear on directions

## 2023-02-17 NOTE — Telephone Encounter (Signed)
-----   Message from [name] sent at 02/17/2023  1:39 PM EDT ----- Results sent by MyChart, but appears patient has not yet reviewed those results.  Please call and make sure they have either seen note or discuss result note.  Thanks.

## 2023-02-19 DIAGNOSIS — H401112 Primary open-angle glaucoma, right eye, moderate stage: Secondary | ICD-10-CM | POA: Diagnosis not present

## 2023-02-19 DIAGNOSIS — H40022 Open angle with borderline findings, high risk, left eye: Secondary | ICD-10-CM | POA: Diagnosis not present

## 2023-02-19 LAB — HM DIABETES EYE EXAM

## 2023-03-02 ENCOUNTER — Other Ambulatory Visit (INDEPENDENT_AMBULATORY_CARE_PROVIDER_SITE_OTHER): Payer: Medicare HMO

## 2023-03-02 ENCOUNTER — Ambulatory Visit: Payer: Medicare HMO | Admitting: Orthopedic Surgery

## 2023-03-02 DIAGNOSIS — M79645 Pain in left finger(s): Secondary | ICD-10-CM

## 2023-03-02 DIAGNOSIS — G8929 Other chronic pain: Secondary | ICD-10-CM

## 2023-03-02 DIAGNOSIS — M65312 Trigger thumb, left thumb: Secondary | ICD-10-CM | POA: Diagnosis not present

## 2023-03-02 NOTE — Progress Notes (Signed)
Gabriel WILDEN Sr. - 82 y.o. male MRN [id number]  Date of birth: 18-Jul-1941  Office Visit Note: Visit Date: 03/02/2023 PCP: [name], MD Referred by: [name], MD  Subjective: Chief Complaint  Patient presents with   Left Hand - Pain   HPI: Gabriel GERSH Sr. is a pleasant 82 y.o. male who presents today for evaluation of ongoing left thumb clicking and locking.  He states that this problem is actually improved over the past few weeks with soaking the hand in hot water, no current pain, no numbness or tingling.  Visit Reason: Left thumb  Hand dominance: left Occupation: retired Diabetic: yes/ 8.2 Heart/Lung History: none Blood Thinners: baby aspirin  Prior Testing/EMG: none Injections (Date): none Treatments: splint Prior Surgery:none   *snapping*  Pertinent ROS were reviewed with the patient and found to be negative unless otherwise specified above in HPI.   Assessment & Plan: Visit Diagnoses:  1. Chronic pain of left thumb     Plan: Extensive discussion was had with the patient today regarding his left trigger thumb.  He demonstrates clinical evidence to support this, however it is significantly improved per his report with conservative measures.  He would like to continue with these conservative measures for the time being which I am in agreement with.    Etiology and pathophysiology of trigger digit was discussed today.  Anti-inflammatory medications to be utilized as needed were also discussed.  Motrin 600 mg every 6 as needed versus Tylenol 1000 mg every 8 as needed were also discussed.  Treatment options in the form of cortisone injection were discussed in detail today including all risk and benefits.  We also discussed the possibility for surgical trigger digit release in the future should his symptoms remain refractory to conservative care.  He expressed full understanding, will return to me in the future for potential injection to  the left trigger thumb should his symptoms recur or worsen.  Follow-up: No follow-ups on file.   Meds & Orders: No orders of the defined types were placed in this encounter.   Orders Placed This Encounter  Procedures   XR Hand Complete Left     Procedures: No procedures performed      Clinical History: No specialty comments available.  He reports that he quit smoking about 12 years ago. His smoking use included cigars. He has never used smokeless tobacco.  Recent Labs    07/24/22 1416 10/23/22 1437 02/04/23 1527  HGBA1C 7.7* 8.4* 8.2*    Objective:   Vital Signs: There were no vitals taken for this visit.  Physical Exam  Gen: Well-appearing, in no acute distress; non-toxic CV: Regular Rate. Well-perfused. Warm.  Resp: Breathing unlabored on room air; no wheezing. Psych: Fluid speech in conversation; appropriate affect; normal thought process Neuro: Sensation intact throughout. No gross coordination deficits.   Ortho Exam Left hand: - Palpable mass at the base of the left thumb A1 pulley, minimally tender - Clicking notable to the left thumb with deep flexion, no active locking - Sensation intact distally to the thumb, normal color and capillary refill distally  Imaging: XR Hand Complete Left  Result Date: 03/02/2023 X-rays of the left hand, multiple views were obtained today X-rays demonstrate scapholunate widening, well-preserved radiocarpal interval.  Ulnar neutral variance.  No other significant bony abnormalities appreciated   Past Medical/Family/Surgical/Social History: Medications & Allergies reviewed per EMR, new medications updated. Patient Active Problem List   Diagnosis Date Noted   DM (  diabetes mellitus) (HCC) 05/12/2016   Hyperlipemia    Chest pain 04/12/2016   Essential hypertension 04/12/2016   Dyspepsia 04/12/2016   Past Medical History:  Diagnosis Date   Hypertension    Urolithiasis    Past stone spontaneously per patient.   Family  History  Problem Relation Age of Onset   Leukemia Mother    Heart attack Father    Stomach cancer Sister    No past surgical history on file. Social History   Occupational History   Occupation: retired  Tobacco Use   Smoking status: Former    Types: Cigars    Quit date: 07/21/2010    Years since quitting: 12.6   Smokeless tobacco: Never  Vaping Use   Vaping status: Never Used  Substance and Sexual Activity   Alcohol use: No   Drug use: No   Sexual activity: Never     Gabriel Wu) Gabriel Wu, M.D. [facility] 1:39 PM

## 2023-03-27 ENCOUNTER — Telehealth: Payer: Self-pay | Admitting: Family Medicine

## 2023-03-27 ENCOUNTER — Other Ambulatory Visit: Payer: Self-pay

## 2023-03-27 DIAGNOSIS — K219 Gastro-esophageal reflux disease without esophagitis: Secondary | ICD-10-CM

## 2023-03-27 MED ORDER — OMEPRAZOLE 20 MG PO CPDR
20.0000 mg | DELAYED_RELEASE_CAPSULE | Freq: Every day | ORAL | Status: DC
Start: 2023-03-27 — End: 2023-03-30

## 2023-03-27 NOTE — Telephone Encounter (Signed)
Completed and informed the patient that he can pick up later today

## 2023-03-27 NOTE — Telephone Encounter (Signed)
Encourage patient to contact the pharmacy for refills or they can request refills through [facility]  WHAT PHARMACY WOULD THEY LIKE THIS SENT TO:  [id number] - [place] ([place]), [place] - [address]   MEDICATION NAME & DOSE: omeprazole (PRILOSEC) 20 MG capsule   NOTES/COMMENTS FROM PATIENT:      Front office please notify patient: It takes 48-72 hours to process rx refill requests Ask patient to call pharmacy to ensure rx is ready before heading there.

## 2023-03-30 ENCOUNTER — Other Ambulatory Visit: Payer: Self-pay

## 2023-03-30 DIAGNOSIS — K219 Gastro-esophageal reflux disease without esophagitis: Secondary | ICD-10-CM

## 2023-03-30 MED ORDER — OMEPRAZOLE 20 MG PO CPDR
20.0000 mg | DELAYED_RELEASE_CAPSULE | Freq: Every day | ORAL | 1 refills | Status: DC
Start: 2023-03-30 — End: 2023-09-22

## 2023-03-30 NOTE — Telephone Encounter (Signed)
Re ordered and informed the patient again that this is done

## 2023-03-30 NOTE — Telephone Encounter (Signed)
Pt called stating pharmacy doesn't have his medication

## 2023-05-07 ENCOUNTER — Ambulatory Visit: Payer: Medicare HMO | Admitting: Family Medicine

## 2023-05-07 ENCOUNTER — Encounter: Payer: Self-pay | Admitting: Family Medicine

## 2023-05-07 VITALS — BP 170/86 | HR 84 | Temp 98.0°F | Ht 70.0 in | Wt 212.0 lb

## 2023-05-07 DIAGNOSIS — E1129 Type 2 diabetes mellitus with other diabetic kidney complication: Secondary | ICD-10-CM

## 2023-05-07 DIAGNOSIS — I10 Essential (primary) hypertension: Secondary | ICD-10-CM | POA: Diagnosis not present

## 2023-05-07 DIAGNOSIS — E785 Hyperlipidemia, unspecified: Secondary | ICD-10-CM | POA: Diagnosis not present

## 2023-05-07 DIAGNOSIS — N1832 Chronic kidney disease, stage 3b: Secondary | ICD-10-CM

## 2023-05-07 DIAGNOSIS — Z23 Encounter for immunization: Secondary | ICD-10-CM | POA: Diagnosis not present

## 2023-05-07 NOTE — Progress Notes (Signed)
Subjective:  Patient ID: Gabriel Millman Sr., male    DOB: [birth year]  Age: 82 y.o. MRN: [id number]  CC:  Chief Complaint  Patient presents with   Medical Management of Chronic Issues    3 month f/u     HPI [name] presents for   Diabetes: With hyperglycemia. Microalbuminuria, CKD. Followed by nephrology.  Metformin d/c d/t CKD, decreased GFR.  Jardiance 10mg  every day - cost prohibitive - stopped few weeks ago as price increased. $150 from $45.  Increased glipizide to 7.5mg  total with elevated readings.  Home readings - 93 today. Lowest reading.  Sometimes 130.  No lows, no 200's.   Microalbumin: nl ratio 04/21/22.  Optho, foot exam, pneumovax:  Statin with lipitor 40mg  every day - no new side effects. Daily use.  Lisinopril 20mg  every day for htn. Denies missed doses.  Lab Results  Component Value Date   HGBA1C 8.2 (H) 02/04/2023   HGBA1C 8.4 (H) 10/23/2022   HGBA1C 7.7 (H) 07/24/2022   Lab Results  Component Value Date   MICROALBUR 1.3 04/21/2022   LDLCALC 32 11/06/2022   CREATININE 1.81 (H) 02/04/2023   BP Readings from Last 3 Encounters:  05/07/23 (!) 170/86  02/04/23 134/78  12/18/22 132/74  Home BP meter not working. No missed doses. No new symptoms.  More salt intake recently.      History Patient Active Problem List   Diagnosis Date Noted   DM (diabetes mellitus) (HCC) 05/12/2016   Hyperlipemia    Chest pain 04/12/2016   Essential hypertension 04/12/2016   Dyspepsia 04/12/2016   Past Medical History:  Diagnosis Date   Hypertension    Urolithiasis    Past stone spontaneously per patient.   History reviewed. No pertinent surgical history. No Known Allergies Prior to Admission medications   Medication Sig Start Date End Date Taking? Authorizing Provider  aspirin 81 MG EC tablet Take 1 tablet (81 mg total) by mouth daily. 08/29/19  Yes [name], MD  atorvastatin (LIPITOR) 40 MG tablet TAKE 1 TABLET BY MOUTH ONCE  DAILY AT  6  PM. 10/23/22  Yes [name], MD  blood glucose meter kit and supplies Dispense based on patient and insurance preference. Use once per day and if symptoms of low blood sugar. (FOR ICD-10 E10.9, E11.9). 01/01/22  Yes [name], MD  fluticasone (FLONASE) 50 MCG/ACT nasal spray Place 2 sprays into both nostrils daily. 04/21/22  Yes [name], MD  glipiZIDE (GLUCOTROL) 5 MG tablet Take 1 tablet (5 mg total) by mouth daily before breakfast. 02/04/23  Yes [name], MD  glipiZIDE 2.5 MG TABS Take 2.5 mg by mouth daily. Patient is to take 1 2.5mg  tablet in addition to the 5mg  dose to equal 7.5 mg daily 02/17/23  Yes [name], MD  lisinopril (ZESTRIL) 20 MG tablet Take 1 tablet (20 mg total) by mouth daily. 02/04/23  Yes [name], MD  omeprazole (PRILOSEC) 20 MG capsule Take 1 capsule (20 mg total) by mouth daily. 03/30/23  Yes [name], MD  empagliflozin (JARDIANCE) 10 MG TABS tablet Take 1 tablet (10 mg total) by mouth daily. Patient not taking: Reported on 05/07/2023 10/23/22   [name], MD   Social History   Socioeconomic History   Marital status: Single    Spouse name: Not on file   Number of children: 2   Years of education: Not on file   Highest education  level: Not on file  Occupational History   Occupation: retired  Tobacco Use   Smoking status: Former    Types: Cigars    Quit date: 07/21/2010    Years since quitting: 12.8   Smokeless tobacco: Never  Vaping Use   Vaping status: Never Used  Substance and Sexual Activity   Alcohol use: No   Drug use: No   Sexual activity: Never  Other Topics Concern   Not on file  Social History Narrative   Lives alone - sons live in [place]   Social Determinants of Health   Financial Resource Strain: Low Risk  (08/29/2021)   Overall Financial Resource Strain (CARDIA)    Difficulty of Paying Living Expenses: Not hard at all  Food Insecurity: No Food Insecurity (08/29/2021)    Hunger Vital Sign    Worried About Running Out of Food in the Last Year: Never true    Ran Out of Food in the Last Year: Never true  Transportation Needs: No Transportation Needs (08/29/2021)   PRAPARE - Administrator, Civil Service (Medical): No    Lack of Transportation (Non-Medical): No  Physical Activity: Insufficiently Active (08/29/2021)   Exercise Vital Sign    Days of Exercise per Week: 4 days    Minutes of Exercise per Session: 30 min  Stress: No Stress Concern Present (08/29/2021)   [employer] of Occupational Health - Occupational Stress Questionnaire    Feeling of Stress : Not at all  Social Connections: Socially Isolated (08/29/2021)   Social Connection and Isolation Panel [NHANES]    Frequency of Communication with Friends and Family: More than three times a week    Frequency of Social Gatherings with Friends and Family: More than three times a week    Attends Religious Services: Never    Database administrator or Organizations: No    Attends Banker Meetings: Never    Marital Status: Divorced  Catering manager Violence: Not At Risk (08/29/2021)   Humiliation, Afraid, Rape, and Kick questionnaire    Fear of Current or Ex-Partner: No    Emotionally Abused: No    Physically Abused: No    Sexually Abused: No    Review of Systems  Constitutional:  Negative for fatigue and unexpected weight change.  Eyes:  Negative for visual disturbance.  Respiratory:  Negative for cough, chest tightness and shortness of breath.   Cardiovascular:  Negative for chest pain, palpitations and leg swelling.  Gastrointestinal:  Negative for abdominal pain and blood in stool.  Neurological:  Negative for dizziness, light-headedness and headaches.     Objective:   Vitals:   05/07/23 1500 05/07/23 1600  BP: (!) 180/96 (!) 170/86  Pulse: 84   Temp: 98 F (36.7 C)   TempSrc: Oral   SpO2: 96%   Weight: 212 lb (96.2 kg)   Height: 5\' 10"  (1.778 m)      Physical  Exam Vitals reviewed.  Constitutional:      Appearance: He is well-developed.  HENT:     Head: Normocephalic and atraumatic.  Neck:     Vascular: No carotid bruit or JVD.  Cardiovascular:     Rate and Rhythm: Normal rate and regular rhythm.     Heart sounds: Normal heart sounds. No murmur heard. Pulmonary:     Effort: Pulmonary effort is normal.     Breath sounds: Normal breath sounds. No rales.  Musculoskeletal:     Right lower leg: No edema.  Left lower leg: No edema.  Skin:    General: Skin is warm and dry.  Neurological:     Mental Status: He is alert and oriented to person, place, and time.  Psychiatric:        Mood and Affect: Mood normal.        Assessment & Plan:  COLTEN HUTCHINGS Sr. is a 82 y.o. male . Type 2 diabetes mellitus with microalbuminuria, without long-term current use of insulin (HCC) - Plan: AMB Referral [facility] Care Management, Comprehensive metabolic panel, Lipid panel, Hemoglobin A1c  -Tolerating meds [place] but unfortunately SGLT2 was cost prohibitive.  Refer to pharmacy to evaluate for options.  Check A1c but anticipate slight elevation.  Close follow-up to decide on med changes depending on coverage  Need for immunization against influenza - Plan: Flu Vaccine Trivalent High Dose (Fluad)  Stage 3b chronic kidney disease (HCC) - Plan: Comprehensive metabolic panel  -Maintain hydration, avoid nephrotoxins, check labs and adjust plan accordingly  Hyperlipidemia, unspecified hyperlipidemia type - Plan: Comprehensive metabolic panel, Lipid panel  -Check labs, adjust regimen accordingly.  Hypertension  -Elevated in office, atypical from previous readings.  Some increased salt intake recently may be contributing.  Hold on med changes for now, salt avoidance discussed, recheck 1 week with home meds.    No orders of the defined types were placed in this encounter.  Patient Instructions  I will check labs and we can discuss medication changes if  needed.  I did place a referral to pharmacy to look into assistance options for your Jardiance.  Watch for any low blood sugars and if those occur we need to decrease the dose of glipizide.  Recheck in 3 months.  Blood pressure was elevated today.  I will check some lab work, but make sure you have not missed any of your lisinopril.  Recheck in the next 1 week with your medications and we can add additional medication at that time if blood pressure still elevated.  We can also discuss other medications for diabetes if needed based on your labs.  If you have any new symptoms with elevated blood pressure be seen right away.    Signed,   Meredith Staggers, MD [facility], [facility] 05/07/23 4:00 PM

## 2023-05-07 NOTE — Patient Instructions (Addendum)
I will check labs and we can discuss medication changes if needed.  I did place a referral to pharmacy to look into assistance options for your Jardiance.  Watch for any low blood sugars and if those occur we need to decrease the dose of glipizide.  Recheck in 3 months.  Blood pressure was elevated today.  I will check some lab work, but make sure you have not missed any of your lisinopril.  Recheck in the next 1 week with your medications and we can add additional medication at that time if blood pressure still elevated.  We can also discuss other medications for diabetes if needed based on your labs.  If you have any new symptoms with elevated blood pressure be seen right away.

## 2023-05-08 LAB — COMPREHENSIVE METABOLIC PANEL
ALT: 18 U/L (ref 0–53)
AST: 19 U/L (ref 0–37)
Albumin: 4.5 g/dL (ref 3.5–5.2)
Alkaline Phosphatase: 98 U/L (ref 39–117)
BUN: 17 mg/dL (ref 6–23)
CO2: 29 meq/L (ref 19–32)
Calcium: 10.2 mg/dL (ref 8.4–10.5)
Chloride: 99 meq/L (ref 96–112)
Creatinine, Ser: 1.49 mg/dL (ref 0.40–1.50)
GFR: 43.62 mL/min — ABNORMAL LOW (ref >60.00)
Glucose, Bld: 125 mg/dL — ABNORMAL HIGH (ref 70–99)
Potassium: 4.8 meq/L (ref 3.5–5.1)
Sodium: 137 meq/L (ref 135–145)
Total Bilirubin: 0.4 mg/dL (ref 0.2–1.2)
Total Protein: 8 g/dL (ref 6.0–8.3)

## 2023-05-08 LAB — LIPID PANEL
Cholesterol: 126 mg/dL (ref 0–200)
HDL: 44.3 mg/dL (ref >39.00)
LDL Cholesterol: 40 mg/dL (ref 0–99)
NonHDL: 81.69
Total CHOL/HDL Ratio: 3
Triglycerides: 209 mg/dL — ABNORMAL HIGH (ref 0.0–149.0)
VLDL: 41.8 mg/dL — ABNORMAL HIGH (ref 0.0–40.0)

## 2023-05-08 LAB — HEMOGLOBIN A1C: Hgb A1c MFr Bld: 8.2 % — ABNORMAL HIGH (ref 4.6–6.5)

## 2023-05-12 ENCOUNTER — Telehealth: Payer: Self-pay

## 2023-05-12 NOTE — Progress Notes (Signed)
Care Guide Note  05/12/2023 Name: ONAJE ANGON Sr. MRN: [id number] DOB: February 07, 1941  Referred by: [name], MD Reason for referral : Care Coordination (Outreach to schedule with Pharm d )   Nathanial Millman Sr. is a 82 y.o. year old male who is a primary care patient of Neva Seat Asencion Partridge, MD. Nathanial Millman Sr. was referred to the pharmacist for assistance related to DM.    Successful contact was made with the patient to discuss pharmacy services including being ready for the pharmacist to call at least 5 minutes before the scheduled appointment time, to have medication bottles and any blood sugar or blood pressure readings ready for review. The patient agreed to meet with the pharmacist via with the pharmacist via telephone visit on (date/time).  05/14/2023  Penne Lash, RMA Care Guide [facility]  [place], [place] [address] Direct Dial: [phone] [email or web address]

## 2023-05-14 ENCOUNTER — Other Ambulatory Visit: Payer: Self-pay | Admitting: Pharmacist

## 2023-05-14 ENCOUNTER — Encounter: Payer: Self-pay | Admitting: Family Medicine

## 2023-05-14 ENCOUNTER — Telehealth: Payer: Self-pay | Admitting: Pharmacist

## 2023-05-14 ENCOUNTER — Ambulatory Visit: Payer: Medicare HMO | Admitting: Family Medicine

## 2023-05-14 VITALS — BP 166/88 | HR 84 | Temp 98.2°F | Ht 70.0 in | Wt 208.0 lb

## 2023-05-14 DIAGNOSIS — E1129 Type 2 diabetes mellitus with other diabetic kidney complication: Secondary | ICD-10-CM | POA: Diagnosis not present

## 2023-05-14 DIAGNOSIS — N1832 Chronic kidney disease, stage 3b: Secondary | ICD-10-CM | POA: Diagnosis not present

## 2023-05-14 DIAGNOSIS — R809 Proteinuria, unspecified: Secondary | ICD-10-CM | POA: Diagnosis not present

## 2023-05-14 DIAGNOSIS — I10 Essential (primary) hypertension: Secondary | ICD-10-CM

## 2023-05-14 MED ORDER — DAPAGLIFLOZIN PROPANEDIOL 5 MG PO TABS: 5.0000 mg | ORAL_TABLET | Freq: Every day | ORAL | 0 refills | Status: DC

## 2023-05-14 NOTE — Progress Notes (Signed)
05/14/2023 Name: Gabriel DEFENBAUGH Sr. MRN: [id number] DOB: 1941-02-03  Chief Complaint  Patient presents with   Medication Management    Gabriel WARSHAUER Sr. is a 82 y.o. year old male who presented for a telephone visit.   They were referred to the pharmacist by their PCP for assistance in managing medication access.    Subjective:  Care Team: Primary Care Provider: [name], MD ; Next Scheduled Visit: 05/14/2023 Nephrologist - [facility] - Dr Valentino Nose or Rogers Blocker, [facility]  Medication Access/Adherence  Current Pharmacy:  [facility] - [place] - [address] [place] [address] Phone: [phone] Fax: [phone]   Patient reports affordability concerns with their medications: Yes  Patient reports access/transportation concerns to their pharmacy: No  Patient reports adherence concerns with their medications:  No     Patient has reached Medicare Coverage Gap last month. He stopped Jardiance 10mg  due to cost of >$150.  Last A1c was 8.2%. Dr Chilton Si adjusted his glipizide to take 7.5mg  daily (taking 1 tablet of 5mg  and 1 tablet of 2.5mg  daily) but would like to restart an SGLT2 due to renal benefits. Patient has CKD 3B.   Recent blood glucose readings:  Lowest 93 Highest 170 Yesterday blood glucose was 117 at lunch  Denies s/s of hypoglycemia.   Last Scr has improved from 1.81 to 1.49 Started Jardiance 09/2021 but due to cost he usually stops taking at the end of each year.   Objective:  Lab Results  Component Value Date   HGBA1C 8.2 (H) 05/07/2023    Lab Results  Component Value Date   CREATININE 1.49 05/07/2023   BUN 17 05/07/2023   NA 137 05/07/2023   K 4.8 05/07/2023   CL 99 05/07/2023   CO2 29 05/07/2023    Lab Results  Component Value Date   CHOL 126 05/07/2023   HDL 44.30 05/07/2023   LDLCALC 40 05/07/2023   LDLDIRECT 53.0 10/23/2022   TRIG 209.0 (H)  05/07/2023   CHOLHDL 3 05/07/2023    Medications Reviewed Today     Reviewed by Henrene Pastor, RPH-CPP (Pharmacist) on 05/14/23 at 1108  Med List Status: <None>   Medication Order Taking? Sig Documenting Provider Last Dose Status Informant  aspirin 81 MG EC tablet [id number] Yes Take 1 tablet (81 mg total) by mouth daily. [name], MD Taking Active   atorvastatin (LIPITOR) 40 MG tablet [id number] Yes TAKE 1 TABLET BY MOUTH ONCE DAILY AT  6  PM. [name], MD Taking Active   blood glucose meter kit and supplies [id number]  Dispense based on patient and insurance preference. Use once per day and if symptoms of low blood sugar. (FOR ICD-10 E10.9, E11.9). [name], MD  Active   empagliflozin (JARDIANCE) 10 MG TABS tablet [id number] No Take 1 tablet (10 mg total) by mouth daily.  Patient not taking: Reported on 05/07/2023   [name], MD Not Taking Active   fluticasone [facility]) 50 MCG/ACT nasal spray [id number]  Place 2 sprays into both nostrils daily. [name], MD  Active   glipiZIDE (GLUCOTROL) 5 MG tablet [id number] Yes Take 1 tablet (5 mg total) by mouth daily before breakfast. [name], MD Taking Active   glipiZIDE 2.5 MG TABS [id number] Yes Take 2.5 mg by mouth daily. Patient is to take 1 2.5mg  tablet in addition to the 5mg  dose to equal 7.5 mg daily [name],  MD Taking Active   lisinopril (ZESTRIL) 20 MG tablet [id number] Yes Take 1 tablet (20 mg total) by mouth daily. [name], MD Taking Active   omeprazole (PRILOSEC) 20 MG capsule [id number] Yes Take 1 capsule (20 mg total) by mouth daily. [name], MD Taking Active               Assessment/Plan:   Diabetes:Currently not at goal A1c - Reviewed goal A1c, goal fasting, and goal 2 hour post prandial glucose - Recommend to continue glipizide 2.5mg  + 5mg  daily  - Recommend to check glucose daily  - Meets financial criteria for Jardiance or [name] patient  assistance programs. Marcelline Deist also has a 30 day coupon  we could use until we are able to complete application for medication assistance program. Patient is scheduled to see Dr Amanda Cockayne this afternoon. Will collaborate with provider about preferred SGLT2.     Follow Up Plan: meet face to face next week to complete medication assistance program application.   Henrene Pastor, PharmD Clinical Pharmacist [facility]  Population Health [phone]

## 2023-05-14 NOTE — Telephone Encounter (Signed)
Called [employer] to provide coupon information below. They were able to process and patient's Gabriel Wu for this months will be $0; Meeting with Gabriel Wu 05/21/2023 for apply for [place] program.  LM on VM of patient's cell number.

## 2023-05-14 NOTE — Patient Instructions (Addendum)
Start farxiga and recheck in 1 week with pharmacist as planned next week. I will try to get a visit at a time near that visit so I can decide if higher dose of blood pressure meds are needed.   Keep a record of your blood pressures outside of the office and blood sugars and bring them to the next office visit in a week.   Follow-up with pharmacist Henrene Pastor at 1045 October 31, and then I will see you right afterwards at 1120.  Let me know if there are questions.  Return to the clinic or go to the nearest emergency room if any of your symptoms worsen or new symptoms occur.

## 2023-05-14 NOTE — Progress Notes (Signed)
Subjective:  Patient ID: Gabriel Millman Sr., male    DOB: [birth year]  Age: 82 y.o. MRN: [id number]  CC:  Chief Complaint  Patient presents with   Follow-up    1 week f/u and medication check up and lab reviews     HPI Gabriel CESENA Sr. presents for   Diabetes: Elevated but stable A1c at his October 17 visit.  Complicated by hyperglycemia, microalbuminuria, CKD, followed by nephrology.  Off metformin due to decreased GFR.  Jardiance to become cost prohibitive and off that medication a few weeks when discussed at his October 17 visit.  Referred to pharmacy.  Visit noted from today.  Will qualify for medication assistance program for either Jardiance or Marcelline Deist but option of 30-day voucher for [facility] initially. He would like to try [name].  Taking 7.5mg  glipizide every day - last reading of 117 yesterday, no symptomatic lows or 200's. .   Lab Results  Component Value Date   HGBA1C 8.2 (H) 05/07/2023   HGBA1C 8.2 (H) 02/04/2023   HGBA1C 8.4 (H) 10/23/2022   Lab Results  Component Value Date   MICROALBUR 1.3 04/21/2022   LDLCALC 40 05/07/2023   CREATININE 1.49 05/07/2023   Hypertension: With CKD.  Followed by nephrology.  Elevated blood pressure reading on the 17th, had increase his salt intake recently, decided against changes in meds at that time.  He had been off of his Jardiance as well. He is currently taking lisinopril 20 mg daily - no missed doses, and cut back on salt.  Home readings: wrist meter - 135/74-85.  BP Readings from Last 3 Encounters:  05/14/23 (!) 166/88  05/07/23 (!) 170/86  02/04/23 134/78   Lab Results  Component Value Date   CREATININE 1.49 05/07/2023  Creatinine improved form 1.81 on 7/17 to 1.49 on 10/17 - eGFR 43.    History Patient Active Problem List   Diagnosis Date Noted   DM (diabetes mellitus) (HCC) 05/12/2016   Hyperlipemia    Chest pain 04/12/2016   Essential hypertension 04/12/2016   Dyspepsia 04/12/2016   Past  Medical History:  Diagnosis Date   Hypertension    Urolithiasis    Past stone spontaneously per patient.   No past surgical history on file. No Known Allergies Prior to Admission medications   Medication Sig Start Date End Date Taking? Authorizing Provider  aspirin 81 MG EC tablet Take 1 tablet (81 mg total) by mouth daily. 08/29/19  Yes [name], MD  atorvastatin (LIPITOR) 40 MG tablet TAKE 1 TABLET BY MOUTH ONCE DAILY AT  6  PM. 10/23/22  Yes [name], MD  blood glucose meter kit and supplies Dispense based on patient and insurance preference. Use once per day and if symptoms of low blood sugar. (FOR ICD-10 E10.9, E11.9). 01/01/22  Yes [name], MD  empagliflozin (JARDIANCE) 10 MG TABS tablet Take 1 tablet (10 mg total) by mouth daily. 10/23/22  Yes [name], MD  fluticasone (FLONASE) 50 MCG/ACT nasal spray Place 2 sprays into both nostrils daily. 04/21/22  Yes [name], MD  glipiZIDE (GLUCOTROL) 5 MG tablet Take 1 tablet (5 mg total) by mouth daily before breakfast. 02/04/23  Yes [name], MD  glipiZIDE 2.5 MG TABS Take 2.5 mg by mouth daily. Patient is to take 1 2.5mg  tablet in addition to the 5mg  dose to equal 7.5 mg daily 02/17/23  Yes [name], MD  lisinopril (ZESTRIL) 20 MG tablet Take 1  tablet (20 mg total) by mouth daily. 02/04/23  Yes [name], MD  omeprazole (PRILOSEC) 20 MG capsule Take 1 capsule (20 mg total) by mouth daily. 03/30/23  Yes [name], MD   Social History   Socioeconomic History   Marital status: Single    Spouse name: Not on file   Number of children: 2   Years of education: Not on file   Highest education level: Not on file  Occupational History   Occupation: retired  Tobacco Use   Smoking status: Former    Types: Cigars    Quit date: 07/21/2010    Years since quitting: 12.8   Smokeless tobacco: Never  Vaping Use   Vaping status: Never Used  Substance and Sexual Activity   Alcohol  use: No   Drug use: No   Sexual activity: Never  Other Topics Concern   Not on file  Social History Narrative   Lives alone - sons live in [place]   Social Determinants of Health   Financial Resource Strain: Low Risk  (08/29/2021)   Overall Financial Resource Strain (CARDIA)    Difficulty of Paying Living Expenses: Not hard at all  Food Insecurity: No Food Insecurity (08/29/2021)   Hunger Vital Sign    Worried About Running Out of Food in the Last Year: Never true    Ran Out of Food in the Last Year: Never true  Transportation Needs: No Transportation Needs (08/29/2021)   PRAPARE - Administrator, Civil Service (Medical): No    Lack of Transportation (Non-Medical): No  Physical Activity: Insufficiently Active (08/29/2021)   Exercise Vital Sign    Days of Exercise per Week: 4 days    Minutes of Exercise per Session: 30 min  Stress: No Stress Concern Present (08/29/2021)   [employer] of Occupational Health - Occupational Stress Questionnaire    Feeling of Stress : Not at all  Social Connections: Socially Isolated (08/29/2021)   Social Connection and Isolation Panel [NHANES]    Frequency of Communication with Friends and Family: More than three times a week    Frequency of Social Gatherings with Friends and Family: More than three times a week    Attends Religious Services: Never    Database administrator or Organizations: No    Attends Banker Meetings: Never    Marital Status: Divorced  Catering manager Violence: Not At Risk (08/29/2021)   Humiliation, Afraid, Rape, and Kick questionnaire    Fear of Current or Ex-Partner: No    Emotionally Abused: No    Physically Abused: No    Sexually Abused: No    Review of Systems  Constitutional:  Negative for fatigue and unexpected weight change.  Eyes:  Negative for visual disturbance.  Respiratory:  Negative for cough, chest tightness and shortness of breath.   Cardiovascular:  Negative for chest pain,  palpitations and leg swelling.  Gastrointestinal:  Negative for abdominal pain and blood in stool.  Neurological:  Negative for dizziness, weakness, light-headedness and headaches.    Objective:   Vitals:   05/14/23 1445 05/14/23 1450  BP: (!) 166/88 (!) 166/88  Pulse: 84   Temp: 98.2 F (36.8 C)   TempSrc: Oral   SpO2: 94%   Weight: 208 lb (94.3 kg)   Height: 5\' 10"  (1.778 m)     Physical Exam Vitals reviewed.  Constitutional:      Appearance: He is well-developed.  HENT:     Head: Normocephalic and  atraumatic.  Neck:     Vascular: No carotid bruit or JVD.  Cardiovascular:     Rate and Rhythm: Normal rate and regular rhythm.     Heart sounds: Normal heart sounds. No murmur heard. Pulmonary:     Effort: Pulmonary effort is normal.     Breath sounds: Normal breath sounds. No rales.  Musculoskeletal:     Right lower leg: No edema.     Left lower leg: No edema.  Skin:    General: Skin is warm and dry.  Neurological:     Mental Status: He is alert and oriented to person, place, and time.  Psychiatric:        Mood and Affect: Mood normal.      Assessment & Plan:  AAQIL MASSUCCI Sr. is a 82 y.o. male . Type 2 diabetes mellitus with microalbuminuria, without long-term current use of insulin (HCC)  -Most recent A1c elevated but stable.  Readings of low 100 as above recently on just glipizide.  Will restart Farxiga low-dose, given his prior renal function may have minimal impact on hyperglycemia but most recent renal function was improved.  Start low-dose initially which may also impact his blood pressure.  See below.  1 week follow-up.  30-day supply given initially with voucher/coupon planned, and medication assistance program paperwork to be completed at pharmacy visit next week  Stage 3b chronic kidney disease (HCC)  -On ACE inhibitor, restarting Farxiga, 5 mg dose initially.  Most recent renal function improved.  Essential hypertension  -Elevated past 2  visits, he has cut back on his sodium intake.  Lower home readings as above.  Restarting Marcelline Deist, it may have slight impact on BP control.  Recheck in 1 week  Meds ordered this encounter  Medications   dapagliflozin propanediol (FARXIGA) 5 MG TABS tablet    Sig: Take 1 tablet (5 mg total) by mouth daily before breakfast.    Dispense:  30 tablet    Refill:  0   Patient Instructions  Start farxiga and recheck in 1 week with pharmacist as planned next week. I will try to get a visit at a time near that visit so I can decide if higher dose of blood pressure meds are needed.   Keep a record of your blood pressures outside of the office and blood sugars and bring them to the next office visit in a week.   Follow-up with pharmacist Henrene Pastor at 1045 October 31, and then I will see you right afterwards at 1120.  Let me know if there are questions.  Return to the clinic or go to the nearest emergency room if any of your symptoms worsen or new symptoms occur.     Signed,   Meredith Staggers, MD  Primary Care, [facility] 05/14/23 3:30 PM

## 2023-05-21 ENCOUNTER — Other Ambulatory Visit: Payer: Medicare HMO | Admitting: Pharmacist

## 2023-05-21 ENCOUNTER — Ambulatory Visit (INDEPENDENT_AMBULATORY_CARE_PROVIDER_SITE_OTHER): Payer: Medicare HMO | Admitting: Family Medicine

## 2023-05-21 VITALS — BP 138/74 | HR 90 | Temp 98.0°F | Ht 70.0 in | Wt 207.6 lb

## 2023-05-21 DIAGNOSIS — Z7984 Long term (current) use of oral hypoglycemic drugs: Secondary | ICD-10-CM

## 2023-05-21 DIAGNOSIS — E1129 Type 2 diabetes mellitus with other diabetic kidney complication: Secondary | ICD-10-CM

## 2023-05-21 DIAGNOSIS — R809 Proteinuria, unspecified: Secondary | ICD-10-CM | POA: Diagnosis not present

## 2023-05-21 DIAGNOSIS — I10 Essential (primary) hypertension: Secondary | ICD-10-CM

## 2023-05-21 MED ORDER — GLIPIZIDE 5 MG PO TABS
7.5000 mg | ORAL_TABLET | Freq: Every day | ORAL | 1 refills | Status: DC
Start: 2023-05-21 — End: 2023-07-02

## 2023-05-21 MED ORDER — ACCU-CHEK GUIDE VI STRP: ORAL_STRIP | 3 refills | Status: AC

## 2023-05-21 MED ORDER — DAPAGLIFLOZIN PROPANEDIOL 5 MG PO TABS
5.0000 mg | ORAL_TABLET | Freq: Every day | ORAL | 0 refills | Status: DC
Start: 2023-05-21 — End: 2023-09-08

## 2023-05-21 NOTE — Patient Instructions (Signed)
Blood pressure looks better today.  No medication changes for blood pressure at this time.  Continue lisinopril same dose.  Marcelline Deist may also be helping.  I sent the Marcelline Deist to be pharmacy for medication assistance. Change glipizide to 1 and 1/2 pills of the 5 mg instead of the 2.5 mg.  That should save some cost.  Recheck with me in 3 months, sooner if any new or worsening symptoms.  Take care

## 2023-05-21 NOTE — Progress Notes (Signed)
05/21/2023 Name: Gabriel CIRELLO Sr. MRN: [id number] DOB: July 27, 1940  Chief Complaint  Patient presents with   Medication Management    Gabriel OSTROWSKY Sr. is a 82 y.o. year old male who presented for a telephone visit.   They were referred to the pharmacist by their PCP for assistance in managing medication access.    Subjective:  Gabriel Wu: Primary Gabriel Provider: [name], MD ; Next Scheduled Visit: 05/21/2023 Nephrologist - [facility] - Dr Valentino Nose or Rogers Blocker, [facility]  Medication Access/Adherence  Current Pharmacy:  [facility] [address] ([place]), [place] - [address] [place]) [place] [address] Phone: [phone] Fax: [phone]  [facility] [place], [place] - [address] [place] [address] Phone: [phone] Fax: [phone]   Patient reports affordability concerns with their medications: Yes  Patient reports access/transportation concerns to their pharmacy: No  Patient reports adherence concerns with their medications:  No     Patient has reached Medicare Coverage Gap last month. He stopped Jardiance 10mg  due to cost of >$150. Last week Dr Neva Seat changed to Marcelline Deist 5mg  take 1 tablet daily and we supplied patient with 30 day free coupon. He is in the office today to [place] and [place] Farxiag.   Last A1c was 8.2%. Dr Chilton Si adjusted his glipizide to take 7.5mg  daily (taking 1 tablet of 5mg  and 1 tablet of 2.5mg  daily) but would like to restart an SGLT2 due to renal benefits. Patient has CKD 3B.   Today patient mentions that the cost of glipizide 2.5mg  was much higher than the 5mg  glipizide (5mg  was $2 and 5mg  strength was $30)  Patient also requests a prescription for test strips today  He has an Accu-Chek Guide glucometer. Checking blood glucose once a day.   Denies s/s of hypoglycemia.   Last Scr has  improved from 1.81 to 1.49 Started Jardiance 09/2021 but due to cost he usually stops taking at the end of each year.   Objective:  Lab Results  Component Value Date   HGBA1C 8.2 (H) 05/07/2023    Lab Results  Component Value Date   CREATININE 1.49 05/07/2023   BUN 17 05/07/2023   NA 137 05/07/2023   K 4.8 05/07/2023   CL 99 05/07/2023   CO2 29 05/07/2023    Lab Results  Component Value Date   CHOL 126 05/07/2023   HDL 44.30 05/07/2023   LDLCALC 40 05/07/2023   LDLDIRECT 53.0 10/23/2022   TRIG 209.0 (H) 05/07/2023   CHOLHDL 3 05/07/2023    Medications Reviewed Today     Reviewed by Henrene Pastor, RPH-CPP (Pharmacist) on 05/21/23 at 1132  Med List Status: <None>   Medication Order Taking? Sig Documenting Provider Last Dose Status Informant  aspirin 81 MG EC tablet [id number] No Take 1 tablet (81 mg total) by mouth daily. [name], MD Taking Active   atorvastatin (LIPITOR) 40 MG tablet [id number] No TAKE 1 TABLET BY MOUTH ONCE DAILY AT  6  PM. [name], MD Taking Active   blood glucose meter kit and supplies [id number] No Dispense based on patient and insurance preference. Use once per day and if symptoms of low blood sugar. (FOR ICD-10 E10.9, E11.9). [name], MD Taking Active   dapagliflozin propanediol (FARXIGA) 5 MG TABS tablet [id number] No Take 1 tablet (5 mg total) by mouth daily  before breakfast. [name], MD Taking Active   fluticasone [facility]) 50 MCG/ACT nasal spray [id number] No Place 2 sprays into both nostrils daily. [name], MD Taking Active   glipiZIDE (GLUCOTROL) 5 MG tablet [id number] No Take 1 tablet (5 mg total) by mouth daily before breakfast. [name], MD Taking Active   glipiZIDE 2.5 MG TABS [id number] No Take 2.5 mg by mouth daily. Patient is to take 1 2.5mg  tablet in addition to the 5mg  dose to equal 7.5 mg daily [name], MD Taking Active   lisinopril (ZESTRIL) 20 MG tablet [id number] No Take 1  tablet (20 mg total) by mouth daily. [name], MD Taking Active   omeprazole (PRILOSEC) 20 MG capsule [id number] No Take 1 capsule (20 mg total) by mouth daily. [name], MD Taking Active               Assessment/Plan:   Diabetes:Currently not at goal A1c - Called [facility] to check cost of glipizide 2.5 and 5mg . The cost of glipizide 2.5mg  is significantly higher than the 5mg  (likely due to 5mg  being prescribed more and manufacturer [facility] is able to get). Sent Dr Neva Seat a message to consider glipizide 5mg  - take 1.5 tablets = 7.5mg  daily.  - Recommend to check glucose daily  - Meets financial criteria for [name] patient assistance programs. Assisted patient in completing medication assistance program application for Farxiga. Forwarding to PCP to review and sign.   - Reviewed 2025 Medicare changes. Patient and I used Medicare.gov site to see what his possible medication cost would be for 2025 if he kept the same [facility] plan. Patient would have a $250 deductible to meet for medications in 2025 so his cost in January or February might be high (About $200) then cost for Jardiance would drop to $47 per month thru October. November thru December Jardiance would be $0. Marcelline Deist is not on [facility] formulary for 2025 but we might be able to get thru medication assistance program for 2025 also.  Patients other generic medications would be $0 for all of 2025 with the exception of fluticasone nasal spray which would be $10.35 each time he fills.     Follow Up Plan: follow up in 2 to 3 weeks to check on medication assistance program for Farxiga.    Henrene Pastor, PharmD Clinical Pharmacist [facility]  Population Health [phone]

## 2023-05-22 ENCOUNTER — Encounter: Payer: Self-pay | Admitting: Family Medicine

## 2023-05-22 LAB — MICROALBUMIN / CREATININE URINE RATIO
Creatinine,U: 82.5 mg/dL
Microalb Creat Ratio: 5 mg/g (ref 0.0–30.0)
Microalb, Ur: 4.1 mg/dL — ABNORMAL HIGH (ref 0.0–1.9)

## 2023-05-28 ENCOUNTER — Telehealth: Payer: Self-pay

## 2023-05-28 NOTE — Telephone Encounter (Signed)
-----   Message from [name] sent at 05/28/2023 11:29 AM EST ----- Call patient or lab letter.  Urine test for protein slightly elevated as seen in the past.  No change in plan for now.

## 2023-05-29 NOTE — Telephone Encounter (Signed)
 Called patient to discuss lab work, no answer, left a message for the patient to call back to discuss

## 2023-06-01 NOTE — Telephone Encounter (Signed)
 Called patient to discuss lab work, no answer, left a message for the patient to call back to discuss

## 2023-06-02 NOTE — Telephone Encounter (Signed)
Pt has been notified.

## 2023-06-04 ENCOUNTER — Other Ambulatory Visit: Payer: Self-pay | Admitting: Pharmacist

## 2023-06-04 NOTE — Progress Notes (Signed)
06/04/2023 Name: Gabriel KNIERIM Sr. MRN: [id number] DOB: 1941/07/15  Chief Complaint  Patient presents with   Medication Management   Diabetes    JDYN RESENDIZ Sr. is a 82 y.o. year old male who presented for a telephone visit.   They were referred to the pharmacist by their PCP for assistance in managing medication access.    Subjective:  Care Team: Primary Care Provider: [name], MD ; Next Scheduled Visit: 08/24/2023 Nephrologist - [facility] - Dr Valentino Nose or Rogers Blocker, [facility]  Medication Access/Adherence  Current Pharmacy:  [facility] [address] ([place]), [place] - [address] [place] ([place]) [place] [address] Phone: [phone] Fax: [phone]  [facility] - [place], [place] - [address] [place] [address] Phone: [phone] Fax: [phone]   Patient reports affordability concerns with their medications: Yes  Patient reports access/transportation concerns to their pharmacy: No  Patient reports adherence concerns with their medications:  No     Patient has CKD 3B and type 2 DM.  Patient has reached Medicare Coverage Gap last month. He stopped Jardiance 10mg  due to cost of >$150.  Jardiance was changed to [name] 5mg  daily. For first refill patient used 30 day free coupon. [place] get Farxiga at no cost.   Last A1c was 8.2%. Dr Chilton Si adjusted his glipizide to take 7.5mg  daily (taking 1 tablet of 5mg  and 1 tablet of 2.5mg  daily but cost of glipizide 2.5mg  was significantly higher than 5mg  tablets. Dr Neva Seat changed Rx to glipizide 5mg  - take 1.5 tablets = 7.5mg  daily. However patient reports today he picked up prescription from [facility] 06/02/2023 and it was for 2.5mg  tablets #90. I can see that Dr Neva Seat did submit the 5mg  tablets to [facility] on 05/21/2023.   Checking blood glucose once a day usually but per patient he has only check once in the  last week - yesterday blood glucose was 137 before lunch.   Denies s/s of hypoglycemia. States he is urinating a lot but he also is drinking a lot more water than usual. He denies increase in thirst but is trying to drink more water to stay hydrated and for his kidneys.   Last Scr has improved from 1.81 to 1.49  Objective:  Lab Results  Component Value Date   HGBA1C 8.2 (H) 05/07/2023    Lab Results  Component Value Date   CREATININE 1.49 05/07/2023   BUN 17 05/07/2023   NA 137 05/07/2023   K 4.8 05/07/2023   CL 99 05/07/2023   CO2 29 05/07/2023    Lab Results  Component Value Date   CHOL 126 05/07/2023   HDL 44.30 05/07/2023   LDLCALC 40 05/07/2023   LDLDIRECT 53.0 10/23/2022   TRIG 209.0 (H) 05/07/2023   CHOLHDL 3 05/07/2023    Medications Reviewed Today     Reviewed by Henrene Pastor, RPH-CPP (Pharmacist) on 06/04/23 at 0930  Med List Status: <None>   Medication Order Taking? Sig Documenting Provider Last Dose Status Informant  aspirin 81 MG EC tablet [id number] Yes Take 1 tablet (81 mg total) by mouth daily. [name], MD Taking Active   atorvastatin (LIPITOR) 40 MG tablet [id number] Yes TAKE 1 TABLET BY MOUTH ONCE DAILY AT  6  PM. [name], MD Taking Active   blood glucose meter kit and supplies [id number] Yes Dispense based on patient and insurance  preference. Use once per day and if symptoms of low blood sugar. (FOR ICD-10 E10.9, E11.9). [name], MD Taking Active   dapagliflozin propanediol (FARXIGA) 5 MG TABS tablet [id number] Yes Take 1 tablet (5 mg total) by mouth daily before breakfast. [name], MD Taking Active   fluticasone [facility]) 50 MCG/ACT nasal spray [id number] Yes Place 2 sprays into both nostrils daily. [name], MD Taking Active   glipiZIDE (GLUCOTROL) 5 MG tablet [id number] Yes Take 1.5 tablets (7.5 mg total) by mouth daily before breakfast. [name], MD Taking Active   glucose blood (ACCU-CHEK GUIDE)  test strip [id number] Yes Use to check blood glucose once a day. (DX: E11.9 - type 2 diabetes) [name], MD Taking Active   lisinopril (ZESTRIL) 20 MG tablet [id number] Yes Take 1 tablet (20 mg total) by mouth daily. [name], MD Taking Active   omeprazole (PRILOSEC) 20 MG capsule [id number] Yes Take 1 capsule (20 mg total) by mouth daily. [name], MD Taking Active               Assessment/Plan:   Diabetes:Currently not at goal A1c - Called [facility] to check on glipizide 5mg  - take 1.5 tablets = 7.5mg  daily. They do have this Rx but was put on file. Requested that they cancel all other prescriptions for glipizide 5mg  daily and 2.5mg  daily. Patient will take glipizide 2.5mg  - 3 tabs = 7.5mg  daily since he has 30 day supply on hand, then change over the 5mg  - take 1.5 tab daily  - Recommend to check glucose daily -Reviewed home goals  Fasting blood glucose goal (before meals) = 80 to 130 Blood glucose goal after a meal = less than 180   - Meets financial criteria for [name] patient assistance program. Called [place] and confirmed patient has been approved to receive Farxiga starting 05/26/2023 thru 07/20/2024. They are processing his first shipment. Tracking number with [place] [employer] [phone]  - Reviewed 2025 Medicare changes. Patient and I used Medicare.gov site to see what his possible medication cost would be for 2025 if he kept the same [facility] plan. Patient would have a $250 deductible to meet for medications in 2025. Marcelline Deist is not on [employer] formulary for 2025 but he is approved continue to get thru medication assistance program.  Patients other generic medications would be $0 for all of 2025 with the exception of fluticasone nasal spray which would be $10.35 each time he fills.     Follow Up Plan: 1 week to check on Farxiga delivery. Then will follow up in 3 months to follow up med adherence and diabetes.    Henrene Pastor,  PharmD Clinical Pharmacist [facility]  Population Health [phone]

## 2023-06-08 ENCOUNTER — Telehealth: Payer: Self-pay | Admitting: Pharmacist

## 2023-06-08 NOTE — Telephone Encounter (Signed)
Received call from patient. He reports he just received first delivery of Farxiga form [place] today.

## 2023-06-09 ENCOUNTER — Other Ambulatory Visit: Payer: Self-pay | Admitting: Pharmacist

## 2023-06-24 DIAGNOSIS — H401112 Primary open-angle glaucoma, right eye, moderate stage: Secondary | ICD-10-CM | POA: Diagnosis not present

## 2023-06-24 DIAGNOSIS — E119 Type 2 diabetes mellitus without complications: Secondary | ICD-10-CM | POA: Diagnosis not present

## 2023-06-24 DIAGNOSIS — H40022 Open angle with borderline findings, high risk, left eye: Secondary | ICD-10-CM | POA: Diagnosis not present

## 2023-06-24 DIAGNOSIS — H25813 Combined forms of age-related cataract, bilateral: Secondary | ICD-10-CM | POA: Diagnosis not present

## 2023-07-02 ENCOUNTER — Other Ambulatory Visit: Payer: Self-pay

## 2023-07-02 ENCOUNTER — Telehealth: Payer: Self-pay | Admitting: Family Medicine

## 2023-07-02 DIAGNOSIS — R809 Proteinuria, unspecified: Secondary | ICD-10-CM

## 2023-07-02 MED ORDER — GLIPIZIDE 5 MG PO TABS: 7.5000 mg | ORAL_TABLET | Freq: Every day | ORAL | 1 refills | Status: DC

## 2023-07-02 NOTE — Telephone Encounter (Signed)
Sent in Rx for patient and called and informed him

## 2023-07-02 NOTE — Telephone Encounter (Signed)
Encourage patient to contact the pharmacy for refills or they can request refills through [facility]  WHAT PHARMACY WOULD THEY LIKE THIS SENT TO:  [facility] - [place] ([place]), [place] - [address]  MEDICATION NAME & DOSE: glipiZIDE glipiZIDE (GLUCOTROL) 2.5 MG tablet  NOTES/COMMENTS FROM PATIENT:  Pt states he's taking glipiZIDE glipiZIDE (GLUCOTROL) 2.5 MG tablet 3 times in the morning. He has tomorrow Friday's dose left. He needs a refill ASAP      Front office please notify patient: It takes 48-72 hours to process rx refill requests Ask patient to call pharmacy to ensure rx is ready before heading there.

## 2023-07-03 ENCOUNTER — Other Ambulatory Visit: Payer: Self-pay | Admitting: Family Medicine

## 2023-07-17 NOTE — Telephone Encounter (Signed)
error 

## 2023-08-07 ENCOUNTER — Telehealth: Payer: Self-pay | Admitting: Family Medicine

## 2023-08-07 DIAGNOSIS — I10 Essential (primary) hypertension: Secondary | ICD-10-CM

## 2023-08-07 DIAGNOSIS — N1832 Chronic kidney disease, stage 3b: Secondary | ICD-10-CM

## 2023-08-07 DIAGNOSIS — E1129 Type 2 diabetes mellitus with other diabetic kidney complication: Secondary | ICD-10-CM

## 2023-08-07 DIAGNOSIS — R7989 Other specified abnormal findings of blood chemistry: Secondary | ICD-10-CM

## 2023-08-07 MED ORDER — LISINOPRIL 20 MG PO TABS: 20.0000 mg | ORAL_TABLET | Freq: Every day | ORAL | 1 refills | Status: DC

## 2023-08-07 NOTE — Telephone Encounter (Addendum)
Rx has been sent in, patient has been informed

## 2023-08-07 NOTE — Telephone Encounter (Signed)
Copied from CRM [id number]. Topic: Clinical - Medication Refill >> Aug 07, 2023 10:14 AM Louie Boston wrote: Most Recent Primary Care Visit:  Provider: Meredith Staggers R  Department: [facility]  Visit Type: OFFICE VISIT  Date: 05/21/2023  Medication: lisinopril (ZESTRIL) 20 MG tablet   Has the patient contacted their pharmacy? Yes (Agent: If no, request that the patient contact the pharmacy for the refill. If patient does not wish to contact the pharmacy document the reason why and proceed with request.) (Agent: If yes, when and what did the pharmacy advise?)  Patient called pharmacy but was unable to reach anyone. Patient has 3 pills left before he is out. Patient also stated that the bottle shows no refills left.   Is this the correct pharmacy for this prescription? Yes If no, delete pharmacy and type the correct one.  This is the patient's preferred pharmacy:  [facility] - [place] ([place]), [place] - [address] [place] ([place]) [place] [address] Phone: [phone] Fax: [phone]   Has the prescription been filled recently? No  Is the patient out of the medication? No  Has the patient been seen for an appointment in the last year OR does the patient have an upcoming appointment? Yes  Can we respond through MyChart? No  Agent: Please be advised that Rx refills may take up to 3 business days. We ask that you follow-up with your pharmacy.

## 2023-08-07 NOTE — Addendum Note (Signed)
Addended by: Eldred Manges on: 08/07/2023 12:24 PM   Modules accepted: Orders

## 2023-08-13 DIAGNOSIS — E1122 Type 2 diabetes mellitus with diabetic chronic kidney disease: Secondary | ICD-10-CM | POA: Diagnosis not present

## 2023-08-13 DIAGNOSIS — I129 Hypertensive chronic kidney disease with stage 1 through stage 4 chronic kidney disease, or unspecified chronic kidney disease: Secondary | ICD-10-CM | POA: Diagnosis not present

## 2023-08-13 DIAGNOSIS — N1832 Chronic kidney disease, stage 3b: Secondary | ICD-10-CM | POA: Diagnosis not present

## 2023-08-13 DIAGNOSIS — E785 Hyperlipidemia, unspecified: Secondary | ICD-10-CM | POA: Diagnosis not present

## 2023-08-24 ENCOUNTER — Encounter: Payer: Self-pay | Admitting: Family Medicine

## 2023-08-24 ENCOUNTER — Ambulatory Visit: Payer: Medicare HMO | Admitting: Family Medicine

## 2023-08-24 VITALS — BP 128/68 | HR 85 | Temp 97.6°F | Wt 205.0 lb

## 2023-08-24 DIAGNOSIS — R809 Proteinuria, unspecified: Secondary | ICD-10-CM

## 2023-08-24 DIAGNOSIS — R202 Paresthesia of skin: Secondary | ICD-10-CM

## 2023-08-24 DIAGNOSIS — E1129 Type 2 diabetes mellitus with other diabetic kidney complication: Secondary | ICD-10-CM | POA: Diagnosis not present

## 2023-08-24 DIAGNOSIS — R200 Anesthesia of skin: Secondary | ICD-10-CM

## 2023-08-24 DIAGNOSIS — R7989 Other specified abnormal findings of blood chemistry: Secondary | ICD-10-CM | POA: Diagnosis not present

## 2023-08-24 DIAGNOSIS — L609 Nail disorder, unspecified: Secondary | ICD-10-CM | POA: Diagnosis not present

## 2023-08-24 DIAGNOSIS — N1832 Chronic kidney disease, stage 3b: Secondary | ICD-10-CM | POA: Diagnosis not present

## 2023-08-24 NOTE — Patient Instructions (Addendum)
Please have hemoglobin A1c, lipid panel and complete metabolic panel performed at labs later this week if possible. If not, we can order those tests here.  Make sure your kidney specialist sends me a copy of those labs.  I am concerned that you may have some circulation difficulties in your feet could be contributing to the numbness and tingling or the diabetes can also cause the symptoms.  I will refer you to a vascular specialist to evaluate that further, but also a podiatrist to help with nail care and to evaluate the likely hammertoe.   Recheck in 3 months as long as labs are stable.  No med changes for now.  Take care

## 2023-08-24 NOTE — Progress Notes (Signed)
Subjective:  Patient ID: Gabriel Millman Sr., male    DOB: 07-19-1941  Age: 83 y.o. MRN: [id number]  CC:  Chief Complaint  Patient presents with   Foot Problem    Notes when he is driving down the road his foot with feel cold and almost numb started about a week or so ago notes no changes in that time    Medical Management of Chronic Issues    Foot concern otherwise patient is well     HPI Gabriel Makki Souter Sr. presents for   Foot concern Sensation of foot numbness, cold sensation with driving. Started about a week ago with driving. Noticed after 30 minute drive. Resolved with walking. Feels cold. No color change. Similar sx's left foot - top, heel - intermittent.  Notices after watching tv at times - feels like going to sleep - improves with walking.  No hand symptoms.   Diabetes: Complicated by hyperglycemia, microalbuminuria, CKD, followed by nephrology.  Off metformin due to decreased GFR, Jardiance because prohibitive and referred to pharmacy.  Started on Farxiga with medication assistance program.  7.5 mg glipizide per day based on last visit. Blood pressure had been elevated but improved on start of Farxiga. On ACE inhibitor with lisinopril 20 mg daily, statin with Lipitor 40 mg daily. Microalbumin: 4.1 on 05/21/2023. Optho, foot exam, pneumovax: Up-to-date Home reading - 110 after lunch.  No lows. No 200's.  Labs planned Thursday for kidney doctor.   Lab Results  Component Value Date   HGBA1C 8.2 (H) 05/07/2023   HGBA1C 8.2 (H) 02/04/2023   HGBA1C 8.4 (H) 10/23/2022   Lab Results  Component Value Date   MICROALBUR 4.1 (H) 05/21/2023   LDLCALC 40 05/07/2023   CREATININE 1.49 05/07/2023     History Patient Active Problem List   Diagnosis Date Noted   DM (diabetes mellitus) (HCC) 05/12/2016   Hyperlipemia    Chest pain 04/12/2016   Essential hypertension 04/12/2016   Dyspepsia 04/12/2016   Past Medical History:  Diagnosis Date   Hypertension     Urolithiasis    Past stone spontaneously per patient.   No past surgical history on file. No Known Allergies Prior to Admission medications   Medication Sig Start Date End Date Taking? Authorizing Provider  aspirin 81 MG EC tablet Take 1 tablet (81 mg total) by mouth daily. 08/29/19  Yes [name], MD  atorvastatin (LIPITOR) 40 MG tablet TAKE 1 TABLET BY MOUTH ONCE DAILY AT  6  PM. 10/23/22  Yes [name], MD  blood glucose meter kit and supplies Dispense based on patient and insurance preference. Use once per day and if symptoms of low blood sugar. (FOR ICD-10 E10.9, E11.9). 01/01/22  Yes [name], MD  dapagliflozin propanediol (FARXIGA) 5 MG TABS tablet Take 1 tablet (5 mg total) by mouth daily before breakfast. 05/21/23  Yes [name], MD  fluticasone [facility]) 50 MCG/ACT nasal spray Place 2 sprays into both nostrils daily. 04/21/22  Yes [name], MD  glipiZIDE (GLUCOTROL) 5 MG tablet Take 1.5 tablets (7.5 mg total) by mouth daily before breakfast. 07/02/23  Yes [name], MD  glucose blood (ACCU-CHEK GUIDE) test strip Use to check blood glucose once a day. (DX: E11.9 - type 2 diabetes) 05/21/23  Yes [name], MD  lisinopril (ZESTRIL) 20 MG tablet Take 1 tablet (20 mg total) by mouth daily. 08/07/23  Yes [name], MD  omeprazole (PRILOSEC) 20 MG capsule  Take 1 capsule (20 mg total) by mouth daily. 03/30/23  Yes [name], MD   Social History   Socioeconomic History   Marital status: Single    Spouse name: Not on file   Number of children: 2   Years of education: Not on file   Highest education level: Not on file  Occupational History   Occupation: retired  Tobacco Use   Smoking status: Former    Types: Cigars    Quit date: 07/21/2010    Years since quitting: 13.1   Smokeless tobacco: Never  Vaping Use   Vaping status: Never Used  Substance and Sexual Activity   Alcohol use: No   Drug use: No   Sexual activity:  Never  Other Topics Concern   Not on file  Social History Narrative   Lives alone - sons live in [place]   Social Drivers of Health   Financial Resource Strain: Low Risk  (08/29/2021)   Overall Financial Resource Strain (CARDIA)    Difficulty of Paying Living Expenses: Not hard at all  Food Insecurity: No Food Insecurity (08/29/2021)   Hunger Vital Sign    Worried About Running Out of Food in the Last Year: Never true    Ran Out of Food in the Last Year: Never true  Transportation Needs: No Transportation Needs (08/29/2021)   PRAPARE - Administrator, Civil Service (Medical): No    Lack of Transportation (Non-Medical): No  Physical Activity: Insufficiently Active (08/29/2021)   Exercise Vital Sign    Days of Exercise per Week: 4 days    Minutes of Exercise per Session: 30 min  Stress: No Stress Concern Present (08/29/2021)   [employer] - Occupational Stress Questionnaire    Feeling of Stress : Not at all  Social Connections: Socially Isolated (08/29/2021)   Social Connection and Isolation Panel [NHANES]    Frequency of Communication with Friends and Family: More than three times a week    Frequency of Social Gatherings with Friends and Family: More than three times a week    Attends Religious Services: Never    Database administrator or Organizations: No    Attends Banker Meetings: Never    Marital Status: Divorced  Catering manager Violence: Not At Risk (08/29/2021)   Humiliation, Afraid, Rape, and Kick questionnaire    Fear of Current or Ex-Partner: No    Emotionally Abused: No    Physically Abused: No    Sexually Abused: No    Review of Systems  Per HPI  Objective:   Vitals:   08/24/23 1454 08/24/23 1540 08/24/23 1541  BP: (!) 148/80 (!) 126/28 128/68  Pulse: 85    Temp: 97.6 F (36.4 C)    TempSrc: Temporal    SpO2: 97%    Weight: 205 lb (93 kg)       Physical Exam Vitals reviewed.  Constitutional:       Appearance: He is well-developed.  HENT:     Head: Normocephalic and atraumatic.  Neck:     Vascular: No carotid bruit or JVD.  Cardiovascular:     Rate and Rhythm: Normal rate and regular rhythm.     Heart sounds: Normal heart sounds. No murmur heard. Pulmonary:     Effort: Pulmonary effort is normal.     Breath sounds: Normal breath sounds. No rales.  Musculoskeletal:     Right lower leg: No edema.     Left lower leg: No  edema.  Skin:    General: Skin is warm and dry.     Comments: See photo, thickened, elongated toenails.  Hammertoe noted with skin erythema without wound.  Difficulty with DP pulse assessment of feet.  Cool toes with cap refill approximately 3 seconds.  Neurological:     Mental Status: He is alert and oriented to person, place, and time.  Psychiatric:        Mood and Affect: Mood normal.       Assessment & Plan:  Gabriel ALCORTA Sr. is a 83 y.o. male . Type 2 diabetes mellitus with microalbuminuria, without long-term current use of insulin (HCC) - Plan: Ambulatory referral to Vascular Surgery, Ambulatory referral to Podiatry  Stage 3b chronic kidney disease (HCC)  Elevated serum creatinine  Nail problem - Plan: Ambulatory referral to Podiatry  Numbness and tingling of foot - Plan: Ambulatory referral to Vascular Surgery, Ambulatory referral to Podiatry  Plan for labs at nephrology this week, A1c, c-Met, lipid panel and adjust plan accordingly.  No med changes for now. Foot dysesthesias and cold feeling could be related to diabetic neuropathy versus vascular cause given positional changes, somewhat slow cap refill without apparent critical limb ischemia symptoms at this time.  Referred to vascular for evaluation with RTC precautions.  Based on intermittent symptoms we will hold on treatment for dysesthesias at this time.  Also referred to podiatry for nail care with history of diabetes.   No orders of the defined types were placed in this  encounter.  Patient Instructions  Please have hemoglobin A1c, lipid panel and complete metabolic panel performed at labs later this week if possible. If not, we can order those tests here.  Make sure your kidney specialist sends me a copy of those labs.  I am concerned that you may have some circulation difficulties in your feet could be contributing to the numbness and tingling or the diabetes can also cause the symptoms.  I will refer you to a vascular specialist to evaluate that further, but also a podiatrist to help with nail care and to evaluate the likely hammertoe.   Recheck in 3 months as long as labs are stable.  No med changes for now.  Take care    Signed,   Meredith Staggers, MD [facility], [facility] 08/24/23 3:41 PM

## 2023-08-27 DIAGNOSIS — N1832 Chronic kidney disease, stage 3b: Secondary | ICD-10-CM | POA: Diagnosis not present

## 2023-08-28 ENCOUNTER — Telehealth: Payer: Self-pay

## 2023-08-28 NOTE — Telephone Encounter (Signed)
 Copied from CRM [id number]. Topic: Clinical - Request for Lab/Test Order >> Aug 27, 2023  2:08 PM Russell PARAS wrote: Reason for CRM: Felease from [employer] contacted clinic to report there are no orders in the system for patient, for hemoglobin A1c, lipid panel, and complete metabolic panel. CB# [phone]

## 2023-08-28 NOTE — Telephone Encounter (Signed)
 Copied from CRM [id number]. Topic: Clinical - Lab/Test Results >> Aug 27, 2023  2:54 PM Burnard DEL wrote: Reason for CRM: [facility] in [place] on [address] called over stating that the lab orders from Dr Levora has not been sent over to them .Patient was suppose to have labs drawn while at this location for his pulmonary and dialysis care. Dr Landy told him to wait to have done then when he seen patient on 08/24/2023,instead of getting drawn in office due to insurance purposes

## 2023-08-28 NOTE — Addendum Note (Signed)
 Addended by: Aahna Rossa R on: 08/28/2023 01:09 PM   Modules accepted: Orders

## 2023-08-28 NOTE — Telephone Encounter (Addendum)
 Patient advised me at his last visit that that he was having labs done by nephrology and was told that they just needed me to let them know what labs I would need.  Is he having labs performed by nephrology?  I am unable to see their orders.  If not I have pended some orders that can be drawn from [facility] if needed.  Thanks

## 2023-08-31 ENCOUNTER — Telehealth: Payer: Self-pay | Admitting: Pharmacist

## 2023-08-31 NOTE — Telephone Encounter (Signed)
 Called patient, bc he did not have orders from us  he could not have the labs drawn so only had Nephrology labs completed at that time, reordered labs for [facility] and made patient an appointment for tomorrow to have these drawn here

## 2023-08-31 NOTE — Telephone Encounter (Signed)
 Patient has been made an appointment for our office Tuesday 09/01/2023

## 2023-08-31 NOTE — Addendum Note (Signed)
 Addended by: Kazmir Oki K on: 08/31/2023 12:13 PM   Modules accepted: Orders

## 2023-08-31 NOTE — Telephone Encounter (Signed)
 Attempt was made to contact patient by phone today to change his appointment for tomorrow 2/11 in the morning to 12.30.   Unable to reach patient. LM on VM with my contact number [phone] with appointment change.

## 2023-09-01 ENCOUNTER — Other Ambulatory Visit (INDEPENDENT_AMBULATORY_CARE_PROVIDER_SITE_OTHER): Payer: Medicare HMO

## 2023-09-01 ENCOUNTER — Other Ambulatory Visit: Payer: Self-pay | Admitting: Pharmacist

## 2023-09-01 DIAGNOSIS — E1129 Type 2 diabetes mellitus with other diabetic kidney complication: Secondary | ICD-10-CM

## 2023-09-01 DIAGNOSIS — N1832 Chronic kidney disease, stage 3b: Secondary | ICD-10-CM

## 2023-09-01 DIAGNOSIS — R809 Proteinuria, unspecified: Secondary | ICD-10-CM | POA: Diagnosis not present

## 2023-09-01 DIAGNOSIS — E119 Type 2 diabetes mellitus without complications: Secondary | ICD-10-CM

## 2023-09-01 LAB — COMPREHENSIVE METABOLIC PANEL
ALT: 15 U/L (ref 0–53)
AST: 17 U/L (ref 0–37)
Albumin: 4.7 g/dL (ref 3.5–5.2)
Alkaline Phosphatase: 111 U/L (ref 39–117)
BUN: 26 mg/dL — ABNORMAL HIGH (ref 6–23)
CO2: 30 meq/L (ref 19–32)
Calcium: 9.8 mg/dL (ref 8.4–10.5)
Chloride: 97 meq/L (ref 96–112)
Creatinine, Ser: 1.76 mg/dL — ABNORMAL HIGH (ref 0.40–1.50)
GFR: 35.64 mL/min — ABNORMAL LOW (ref >60.00)
Glucose, Bld: 98 mg/dL (ref 70–99)
Potassium: 3.9 meq/L (ref 3.5–5.1)
Sodium: 134 meq/L — ABNORMAL LOW (ref 135–145)
Total Bilirubin: 0.5 mg/dL (ref 0.2–1.2)
Total Protein: 8.6 g/dL — ABNORMAL HIGH (ref 6.0–8.3)

## 2023-09-01 LAB — LIPID PANEL
Cholesterol: 127 mg/dL (ref 0–200)
HDL: 44.5 mg/dL (ref >39.00)
LDL Cholesterol: 42 mg/dL (ref 0–99)
NonHDL: 82.44
Total CHOL/HDL Ratio: 3
Triglycerides: 204 mg/dL — ABNORMAL HIGH (ref 0.0–149.0)
VLDL: 40.8 mg/dL — ABNORMAL HIGH (ref 0.0–40.0)

## 2023-09-01 NOTE — Progress Notes (Signed)
09/01/2023 Name: Gabriel VIRNIG Sr. MRN: [id number] DOB: 07/06/1941  Chief Complaint  Patient presents with   Medication Management   Diabetes    Gabriel KAPUSTA Sr. is a 83 y.o. year old male who presented for a telephone visit.   They were referred to the pharmacist by their PCP for assistance in managing medication access.    Subjective:  Care Team: Primary Care Provider: [name], MD ; Next Scheduled Visit: 11/25/2023 Nephrologist - [facility] - Dr Valentino Nose or Rogers Blocker, [facility] - last office visit was 08/13/2023 Podiatrist - initial visit scheduled for 09/10/2023  Medication Access/Adherence  Current Pharmacy:  [facility] [address] ([place]), [place] - [address] [place] ([place]) [place] [address] Phone: [phone] Fax: [phone]  [facility] - [place], [place] - [address] [place] [address] Phone: [phone] Fax: [phone]   Patient reports affordability concerns with their medications: Yes  Patient reports access/transportation concerns to their pharmacy: No  Patient reports adherence concerns with their medications:  No     Patient has CKD 3B and type 2 DM.  Current medication: Farxiga 5mg  daily, glipizide 5mg  - take 1.5 tablet daily.   Previously was taking Jardiance 10mg  daily but stopped in 2024 due to cost of >$150. Jardiance was changed to [name] 5mg  daily. Patient [place]. He endorses that he received a delivery from [place] 2 weeks ago for 90 days apply.   Last A1c was 8.2%. Dr Chilton Si adjusted his glipizide to take 7.5mg  daily.   Recent blood glucose readings per patient: 110, 156.    Last Scr has improved from 1.81 to 1.49 - he is due to have labs checked later today at Dr [name] office.   Patient reports that his wrist blood pressure cuff has stopped working. He has changed batteries but still not  working.   Objective:  Lab Results  Component Value Date   HGBA1C 8.2 (H) 05/07/2023    Lab Results  Component Value Date   CREATININE 1.49 05/07/2023   BUN 17 05/07/2023   NA 137 05/07/2023   K 4.8 05/07/2023   CL 99 05/07/2023   CO2 29 05/07/2023    Lab Results  Component Value Date   CHOL 126 05/07/2023   HDL 44.30 05/07/2023   LDLCALC 40 05/07/2023   LDLDIRECT 53.0 10/23/2022   TRIG 209.0 (H) 05/07/2023   CHOLHDL 3 05/07/2023    Medications Reviewed Today     Reviewed by Henrene Pastor, RPH-CPP (Pharmacist) on 09/01/23 at 1244  Med List Status: <None>   Medication Order Taking? Sig Documenting Provider Last Dose Status Informant  aspirin 81 MG EC tablet [id number] Yes Take 1 tablet (81 mg total) by mouth daily. [name], MD Taking Active   atorvastatin (LIPITOR) 40 MG tablet [id number] Yes TAKE 1 TABLET BY MOUTH ONCE DAILY AT  6  PM. [name], MD Taking Active   blood glucose meter kit and supplies [id number] Yes Dispense based on patient and insurance preference. Use once per day and if symptoms of low blood sugar. (FOR ICD-10 E10.9, E11.9). [name], MD Taking Active   dapagliflozin propanediol (FARXIGA) 5 MG TABS tablet [id number] Yes Take 1 tablet (5 mg total) by mouth daily before breakfast. [name], MD Taking Active            Med  Note Clydie Braun, Jewelia Bocchino B   Tue Sep 01, 2023 12:40 PM) [place] thru 07/20/2024 - send Rx to [facility]  fluticasone (FLONASE) 50 MCG/ACT nasal spray [id number] Yes Place 2 sprays into both nostrils daily.  Patient taking differently: Place 2 sprays into both nostrils daily. As needed   [name], MD Taking Active   glipiZIDE (GLUCOTROL) 5 MG tablet [id number] Yes Take 1.5 tablets (7.5 mg total) by mouth daily before breakfast. [name], MD Taking Active   glucose blood (ACCU-CHEK GUIDE) test strip [id number] Yes Use to check blood glucose once a day. (DX: E11.9 - type 2 diabetes)  [name], MD Taking Active   lisinopril (ZESTRIL) 20 MG tablet [id number] Yes Take 1 tablet (20 mg total) by mouth daily. [name], MD Taking Active   omeprazole (PRILOSEC) 20 MG capsule [id number] Yes Take 1 capsule (20 mg total) by mouth daily. [name], MD Taking Active               Assessment/Plan:   Diabetes:Currently not at goal A1c but home blood glucose readings have improved.  - Continue Farxiga 5mg  daily and glipizide 5mg  - take 1.5 tablets = 7.5mg  daily.   - Recommend to check glucose daily - Reviewed home goals  Fasting blood glucose goal (before meals) = 80 to 130 Blood glucose goal after a meal = less than 180   - Meets financial criteria for [name] patient assistance program. [place] thru 07/20/2024   - Patient has $50 / quarter of over-the-counter benefits. He can use these benefits to purchase a blood pressure cuff either thru [employer] (online or phone order) or at either [facility] or [facility]. Also requested that [facility] mail patient a catalog for over-the-counter products.     Follow Up Plan: October 2025 to renew [name] patient assistance program or sooner if A1c has not improved.     Henrene Pastor, PharmD Clinical Pharmacist [facility]  Population Health [phone]

## 2023-09-02 LAB — HEMOGLOBIN A1C: Hgb A1c MFr Bld: 8.4 % — ABNORMAL HIGH (ref 4.6–6.5)

## 2023-09-07 ENCOUNTER — Encounter: Payer: Self-pay | Admitting: Family Medicine

## 2023-09-07 ENCOUNTER — Ambulatory Visit (INDEPENDENT_AMBULATORY_CARE_PROVIDER_SITE_OTHER): Payer: Medicare HMO | Admitting: Family Medicine

## 2023-09-07 VITALS — BP 138/76 | HR 84 | Temp 99.1°F | Ht 70.0 in | Wt 205.8 lb

## 2023-09-07 DIAGNOSIS — Z7984 Long term (current) use of oral hypoglycemic drugs: Secondary | ICD-10-CM | POA: Diagnosis not present

## 2023-09-07 DIAGNOSIS — R809 Proteinuria, unspecified: Secondary | ICD-10-CM

## 2023-09-07 DIAGNOSIS — R779 Abnormality of plasma protein, unspecified: Secondary | ICD-10-CM

## 2023-09-07 DIAGNOSIS — E1129 Type 2 diabetes mellitus with other diabetic kidney complication: Secondary | ICD-10-CM | POA: Diagnosis not present

## 2023-09-07 DIAGNOSIS — N1832 Chronic kidney disease, stage 3b: Secondary | ICD-10-CM

## 2023-09-07 LAB — COMPREHENSIVE METABOLIC PANEL
ALT: 15 U/L (ref 0–53)
AST: 16 U/L (ref 0–37)
Albumin: 4.6 g/dL (ref 3.5–5.2)
Alkaline Phosphatase: 113 U/L (ref 39–117)
BUN: 20 mg/dL (ref 6–23)
CO2: 27 meq/L (ref 19–32)
Calcium: 9.9 mg/dL (ref 8.4–10.5)
Chloride: 101 meq/L (ref 96–112)
Creatinine, Ser: 1.78 mg/dL — ABNORMAL HIGH (ref 0.40–1.50)
GFR: 35.16 mL/min — ABNORMAL LOW (ref >60.00)
Glucose, Bld: 101 mg/dL — ABNORMAL HIGH (ref 70–99)
Potassium: 4.6 meq/L (ref 3.5–5.1)
Sodium: 137 meq/L (ref 135–145)
Total Bilirubin: 0.4 mg/dL (ref 0.2–1.2)
Total Protein: 8.3 g/dL (ref 6.0–8.3)

## 2023-09-07 NOTE — Progress Notes (Signed)
 Subjective:  Patient ID: Gabriel Millman Sr., male    DOB: 1940-08-12  Age: 83 y.o. MRN: [id number]  CC:  Chief Complaint  Patient presents with   Results    Pt is well no concerns     HPI Gabriel E Mangus Sr. presents for   Diabetes:  Complicated by hyperglycemia, microalbuminuria, CKD, followed by nephrology. Off metformin due to decreased GFR, on Farxiga with medication assistance program and 7.5 mg glipizide.  Also followed by pharmacist.  He is on ACE inhibitor with lisinopril 20 mg daily, statin with Lipitor 40 mg daily. Foot dysesthesias and cold feeling discussed last visit, possible diabetic neuropathy versus vascular cause given positional changes.  Referred to vascular specialist as well as podiatry for above as well as nail care, with multiple thickened, elongated toenails, hammertoe.  Appointment with podiatry on February 20.  Recent A1c still elevated 8.4 on 09/01/2023.  Creatinine similar to previous readings at 1.76, borderline sodium of 134.  LDL looked good at 42.  He did have an elevated total protein of 8.6.  Up from 8.0 previously.  No recent missed doses of farxiga (5mg  every day) or glipizide (7.5mg  QAM).  Home readings:  138, 156 after meal. No fasting readings.   No symptomatic lows. Lowest 116. No 200's.    Lab Results  Component Value Date   HGBA1C 8.4 (H) 09/01/2023   HGBA1C 8.2 (H) 05/07/2023   HGBA1C 8.2 (H) 02/04/2023   Lab Results  Component Value Date   MICROALBUR 4.1 (H) 05/21/2023   LDLCALC 42 09/01/2023   CREATININE 1.76 (H) 09/01/2023    History Patient Active Problem List   Diagnosis Date Noted   DM (diabetes mellitus) (HCC) 05/12/2016   Hyperlipemia    Chest pain 04/12/2016   Essential hypertension 04/12/2016   Dyspepsia 04/12/2016   Past Medical History:  Diagnosis Date   Hypertension    Urolithiasis    Past stone spontaneously per patient.   No past surgical history on file. No Known Allergies Prior to  Admission medications   Medication Sig Start Date End Date Taking? Authorizing Provider  aspirin 81 MG EC tablet Take 1 tablet (81 mg total) by mouth daily. 08/29/19  Yes [name], MD  atorvastatin (LIPITOR) 40 MG tablet TAKE 1 TABLET BY MOUTH ONCE DAILY AT  6  PM. 10/23/22  Yes [name], MD  blood glucose meter kit and supplies Dispense based on patient and insurance preference. Use once per day and if symptoms of low blood sugar. (FOR ICD-10 E10.9, E11.9). 01/01/22  Yes [name], MD  dapagliflozin propanediol (FARXIGA) 5 MG TABS tablet Take 1 tablet (5 mg total) by mouth daily before breakfast. 05/21/23  Yes [name], MD  fluticasone [facility]) 50 MCG/ACT nasal spray Place 2 sprays into both nostrils daily. Patient taking differently: Place 2 sprays into both nostrils daily. As needed 04/21/22  Yes [name], MD  glipiZIDE (GLUCOTROL) 5 MG tablet Take 1.5 tablets (7.5 mg total) by mouth daily before breakfast. 07/02/23  Yes [name], MD  glucose blood (ACCU-CHEK GUIDE) test strip Use to check blood glucose once a day. (DX: E11.9 - type 2 diabetes) 05/21/23  Yes [name], MD  lisinopril (ZESTRIL) 20 MG tablet Take 1 tablet (20 mg total) by mouth daily. 08/07/23  Yes [name], MD  omeprazole (PRILOSEC) 20 MG capsule Take 1 capsule (20 mg total) by mouth daily. 03/30/23  Yes [name],  MD   Social History   Socioeconomic History   Marital status: Single    Spouse name: Not on file   Number of children: 2   Years of education: Not on file   Highest education level: Not on file  Occupational History   Occupation: retired  Tobacco Use   Smoking status: Former    Types: Cigars    Quit date: 07/21/2010    Years since quitting: 13.1   Smokeless tobacco: Never  Vaping Use   Vaping status: Never Used  Substance and Sexual Activity   Alcohol use: No   Drug use: No   Sexual activity: Never  Other Topics Concern   Not on file   Social History Narrative   Lives alone - sons live in [place]   Social Drivers of Health   Financial Resource Strain: Low Risk  (08/29/2021)   Overall Financial Resource Strain (CARDIA)    Difficulty of Paying Living Expenses: Not hard at all  Food Insecurity: No Food Insecurity (08/29/2021)   Hunger Vital Sign    Worried About Running Out of Food in the Last Year: Never true    Ran Out of Food in the Last Year: Never true  Transportation Needs: No Transportation Needs (08/29/2021)   PRAPARE - Administrator, Civil Service (Medical): No    Lack of Transportation (Non-Medical): No  Physical Activity: Insufficiently Active (08/29/2021)   Exercise Vital Sign    Days of Exercise per Week: 4 days    Minutes of Exercise per Session: 30 min  Stress: No Stress Concern Present (08/29/2021)   [employer] of Occupational Health - Occupational Stress Questionnaire    Feeling of Stress : Not at all  Social Connections: Socially Isolated (08/29/2021)   Social Connection and Isolation Panel [NHANES]    Frequency of Communication with Friends and Family: More than three times a week    Frequency of Social Gatherings with Friends and Family: More than three times a week    Attends Religious Services: Never    Database administrator or Organizations: No    Attends Banker Meetings: Never    Marital Status: Divorced  Catering manager Violence: Not At Risk (08/29/2021)   Humiliation, Afraid, Rape, and Kick questionnaire    Fear of Current or Ex-Partner: No    Emotionally Abused: No    Physically Abused: No    Sexually Abused: No    Review of Systems  Constitutional:  Negative for fatigue and unexpected weight change.  Eyes:  Negative for visual disturbance.  Respiratory:  Negative for cough, chest tightness and shortness of breath.   Cardiovascular:  Negative for chest pain, palpitations and leg swelling.  Gastrointestinal:  Negative for abdominal pain and blood in stool.   Neurological:  Negative for dizziness, light-headedness and headaches.     Objective:   Vitals:   09/07/23 1325 09/07/23 1359  BP: (!) 160/82 138/76  Pulse: 84   Temp: 99.1 F (37.3 C)   TempSrc: Temporal   SpO2: 97%   Weight: 205 lb 12.8 oz (93.4 kg)   Height: 5\' 10"  (1.778 m)     Physical Exam Vitals reviewed.  Constitutional:      Appearance: He is well-developed.  HENT:     Head: Normocephalic and atraumatic.  Neck:     Vascular: No carotid bruit or JVD.  Cardiovascular:     Rate and Rhythm: Normal rate and regular rhythm.     Heart sounds: Normal  heart sounds. No murmur heard. Pulmonary:     Effort: Pulmonary effort is normal.     Breath sounds: Normal breath sounds. No rales.  Musculoskeletal:     Right lower leg: No edema.     Left lower leg: No edema.  Skin:    General: Skin is warm and dry.  Neurological:     Mental Status: He is alert and oriented to person, place, and time.  Psychiatric:        Mood and Affect: Mood normal.        Assessment & Plan:  DAYMIEN GOTH Sr. is a 83 y.o. male . Type 2 diabetes mellitus with microalbuminuria, without long-term current use of insulin (HCC) - Plan: Comprehensive metabolic panel  Stage 3b chronic kidney disease (HCC) - Plan: Comprehensive metabolic panel  Elevated serum protein level - Plan: Comprehensive metabolic panel  Uncontrolled diabetes with A1c 8.4, would like to see that around 7.5 at his age.  Option of increasing glipizide, but I did review his most recent nephrology note with ideal dosing of Farxiga 10 mg.  Will try increasing Marcelline Deist initially, recheck levels in 3 months.  Will send message to pharmacist as I believe he receives this through medication assistance program.  Continue glipizide 7.5 mg daily with RTC precautions.  Recent elevated creatinine but similar to previous levels and nephrology visit reviewed fromJanuary 23rd.  Diabetic kidney disease and arterionephrosclerosis.  Did  not have proteinuria.  Continued on Farxiga with ideally 10 mg dose and avoidance of NSAIDs.  43-month follow-up planned with nephrology.  Lisinopril was restarted.  Slight elevated serum protein noted, repeat labs today.  No orders of the defined types were placed in this encounter.  Patient Instructions  Try taking 2 tablets of the farxiga each day - total 10mg  per day (each pill is 5mg ). That may help some with blood pressure as well as diabetes as blood pressure did improve on farxiga in the past. I will check with your pharmacist to see how to change the order with the assistance program. No other changes for now.  I will repeat some of the labs that were borderline from a week ago.  Take care!    Signed,   Meredith Staggers, MD [facility], [facility] 09/07/23 2:04 PM

## 2023-09-07 NOTE — Patient Instructions (Addendum)
 Try taking 2 tablets of the farxiga each day - total 10mg  per day (each pill is 5mg ). That may help some with blood pressure as well as diabetes as blood pressure did improve on farxiga in the past. I will check with your pharmacist to see how to change the order with the assistance program. No other changes for now.  I will repeat some of the labs that were borderline from a week ago.  Take care!

## 2023-09-08 ENCOUNTER — Encounter: Payer: Self-pay | Admitting: Pharmacist

## 2023-09-08 ENCOUNTER — Other Ambulatory Visit: Payer: Self-pay | Admitting: Pharmacist

## 2023-09-08 DIAGNOSIS — R809 Proteinuria, unspecified: Secondary | ICD-10-CM

## 2023-09-08 MED ORDER — DAPAGLIFLOZIN PROPANEDIOL 10 MG PO TABS: 10.0000 mg | ORAL_TABLET | Freq: Every day | ORAL | 2 refills | Status: DC

## 2023-09-08 NOTE — Progress Notes (Signed)
 Lab results have been discussed.   Verbalized understanding? Yes  Are there any questions? No

## 2023-09-08 NOTE — Telephone Encounter (Signed)
 New dose ordered.

## 2023-09-08 NOTE — Progress Notes (Signed)
 09/08/2023 Name: Gabriel WULF Sr. MRN: [id number] DOB: [birth year]  Chief Complaint  Patient presents with   Diabetes   Hypertension    Gabriel HEMANN Sr. is a 83 y.o. year old male who presented for a telephone visit.   They were referred to the pharmacist by their PCP for assistance in managing medication access.    Subjective:  Care Team: Primary Care Provider: [name], MD ; Next Scheduled Visit: 11/25/2023 Nephrologist - [facility] - Dr Valentino Nose or Rogers Blocker, [facility] - last office visit was 08/13/2023 Podiatrist - initial visit scheduled for 09/10/2023 but was cancelled due to expected bad weather.   Medication Access/Adherence  Current Pharmacy:  [facility] [address] ([place]), [place] - [address] [place] ([place]) [place] [address] Phone: [phone] Fax: [phone]  [facility] - [place], [place] - [address] [place] [address] Phone: [phone] Fax: [phone]   Patient reports affordability concerns with their medications: Yes  Patient reports access/transportation concerns to their pharmacy: No  Patient reports adherence concerns with their medications:  No     Patient has CKD 3B and type 2 DM.  Current medication: Farxiga 5mg   - instructed yesterday to take 2 tablets daily, glipizide 5mg  - take 1.5 tablet daily.   Previously was taking Jardiance 10mg  daily but stopped in 2024 due to cost of >$150. Jardiance was changed to [name] 5mg  daily. Patient [place]. He endorses that he received a delivery from [place] weeks ago for 90 days apply. However since dose was increased yesterday he will likely run out of current supply in about 6 weeks.   Last A1c was 8.4%. (had increased from 8.2% - Dr Neva Seat increased [name] to 10mg  daily.   Recent blood glucose readings per patient: 115 to 150's  Scr had improved from 1.81 to  1.49. Last Scr showed increased to 1.78   Hypertension: Current therapy - lisinopril 20mg  daily.  Patient purchased a blood pressure cuff yesterday at [facility]. Check blood pressure once so far, yesterday evening. Blood pressure was 137/90  Blood pressure initially yesterday in office was 160/82 but was at goal on recheck. Patient reports he was worried about his dog who is having surgery today.   BP Readings from Last 3 Encounters:  09/07/23 138/76  08/24/23 128/68  05/21/23 138/74    Objective:  Lab Results  Component Value Date   HGBA1C 8.4 (H) 09/01/2023    Lab Results  Component Value Date   CREATININE 1.78 (H) 09/07/2023   BUN 20 09/07/2023   NA 137 09/07/2023   K 4.6 09/07/2023   CL 101 09/07/2023   CO2 27 09/07/2023    Lab Results  Component Value Date   CHOL 127 09/01/2023   HDL 44.50 09/01/2023   LDLCALC 42 09/01/2023   LDLDIRECT 53.0 10/23/2022   TRIG 204.0 (H) 09/01/2023   CHOLHDL 3 09/01/2023    Medications Reviewed Today     Reviewed by Henrene Pastor, RPH-CPP (Pharmacist) on 09/08/23 at 1100  Med List Status: <None>   Medication Order Taking? Sig Documenting Provider Last Dose Status Informant  aspirin 81 MG EC tablet [id number] No Take 1 tablet (81 mg total) by mouth daily. [name], MD Taking Active   atorvastatin (LIPITOR) 40 MG tablet [id number] No TAKE 1 TABLET BY MOUTH ONCE DAILY AT  6  PM.  [name], MD Taking Active   blood glucose meter kit and supplies [id number] No Dispense based on patient and insurance preference. Use once per day and if symptoms of low blood sugar. (FOR ICD-10 E10.9, E11.9). [name], MD Taking Active   dapagliflozin propanediol (FARXIGA) 5 MG TABS tablet [id number] No Take 1 tablet (5 mg total) by mouth daily before breakfast.  Patient taking differently: Take 10 mg by mouth daily before breakfast.   [name], MD Taking Active            Med Note [facility]   Tue Sep 01, 2023 12:40 PM)  [place] 07[place]20/2024 - send Rx to [facility]  fluticasone (FLONASE) 50 MCG/ACT nasal spray [id number] No Place 2 sprays into both nostrils daily.  Patient taking differently: Place 2 sprays into both nostrils daily. As needed   [name], MD Taking Active   glipiZIDE (GLUCOTROL) 5 MG tablet [id number] No Take 1.5 tablets (7.5 mg total) by mouth daily before breakfast. [name], MD Taking Active   glucose blood (ACCU-CHEK GUIDE) test strip [id number] No Use to check blood glucose once a day. (DX: E11.9 - type 2 diabetes) [name], MD Taking Active   lisinopril (ZESTRIL) 20 MG tablet [id number] No Take 1 tablet (20 mg total) by mouth daily. [name], MD Taking Active   omeprazole (PRILOSEC) 20 MG capsule [id number] No Take 1 capsule (20 mg total) by mouth daily. [name], MD Taking Active               Assessment/Plan:   Diabetes:Currently not at goal A1c but home blood glucose readings have improved.  - Continue Farxiga 10mg  daily per increase yesterday. [place] and provided new dose. Continue glipizide 5mg  - take 1.5 tablets = 7.5mg  daily.   - Recommend to check glucose daily - Reviewed home goals  Fasting blood glucose goal (before meals) = 80 to 130 Blood glucose goal after a meal = less than 180   - Meets financial criteria for [name]. [place]20/2024   Hypertension:  - Recommended he check blood pressure once daily and record.  - Continue lisinopril 20mg  daily.  - Discussed blood pressure goal of < 130/80 (DM and CKD)    Follow Up Plan: 2 weeks to check on blood pressure and blood glucose   Henrene Pastor, PharmD Clinical Pharmacist [facility]  Population Health [phone]

## 2023-09-10 ENCOUNTER — Ambulatory Visit: Payer: Medicare HMO | Admitting: Podiatry

## 2023-09-22 ENCOUNTER — Other Ambulatory Visit: Payer: Self-pay | Admitting: Pharmacist

## 2023-09-22 ENCOUNTER — Other Ambulatory Visit: Payer: Self-pay | Admitting: Family Medicine

## 2023-09-22 ENCOUNTER — Telehealth: Payer: Self-pay | Admitting: Pharmacist

## 2023-09-22 DIAGNOSIS — K219 Gastro-esophageal reflux disease without esophagitis: Secondary | ICD-10-CM

## 2023-09-22 NOTE — Progress Notes (Signed)
 09/22/2023 Name: Gabriel BEKKER Sr. MRN: [id number] DOB: August 05, 1940  Chief Complaint  Patient presents with   Diabetes   Hypertension    Gabriel REINDL Sr. is a 83 y.o. year old male who presented for a telephone visit.   They were referred to the pharmacist by their PCP for assistance in managing medication access.    Subjective:  Care Team: Primary Care Provider: [name], MD ; Next Scheduled Visit: 11/25/2023 Nephrologist - [facility] - Dr Valentino Nose or Rogers Blocker, [facility] - last office visit was 08/13/2023 (has appointment in the next 3 to 4 week per patient) Podiatrist - initial visit scheduled for 09/10/2023 but was cancelled due to expected bad weather.   Medication Access/Adherence  Current Pharmacy:  [facility] [address] ([place]), [place] - [address] [place] ([place]) [place] [address] Phone: [phone] Fax: [phone]  [facility] - [place], [place] - [address] [place] [address] Phone: [phone] Fax: [phone]   Patient reports affordability concerns with their medications: Yes  Patient reports access/transportation concerns to their pharmacy: No  Patient reports adherence concerns with their medications:  No     Patient has CKD 3B and type 2 DM.  Current medication: Farxiga 10mg  daily and  glipizide 5mg  - take 1.5 tablet daily.   Previously was taking Jardiance 10mg  daily but stopped in 2024 due to cost of >$150. Jardiance was changed to [name] 5mg  daily. Patient is [place]xiga thru [place]. He endorses that he received a [place] from [place] [employer] [place] this week with new dose of Farxiga 10mg .   Last A1c was 8.4%. (had increased from 8.2% - Dr Neva Seat increased [name] to 10mg  daily.   Recent blood glucose readings per patient: 166 today - this was 2 hours after breakfast; other readings recently in 110's to 160's Denies signs of hypoglycemia-  shakiness, sweating, weakness,  Denies signs of hyperglycemia - polyuria or polydipsia  Lab Results  Component Value Date   NA 137 09/07/2023   CL 101 09/07/2023   K 4.6 09/07/2023   CO2 27 09/07/2023   BUN 20 09/07/2023   CREATININE 1.78 (H) 09/07/2023   GFR 35.16 (L) 09/07/2023   CALCIUM 9.9 09/07/2023   PHOS 1.5 (L) 02/14/2022   ALBUMIN 4.6 09/07/2023   GLUCOSE 101 (H) 09/07/2023    Hypertension: Current therapy - lisinopril 20mg  daily.  Patient purchased a blood pressure cuff and has been checking blood pressure daily. Recent blood pressure readings: 137/90, 147391, 133/72, 135/69, 129/83, 140/78, 126/63, 140/72 HR: 77, 75, 95, 81, 69, 82, 79  BP Readings from Last 3 Encounters:  09/07/23 138/76  08/24/23 128/68  05/21/23 138/74    Objective:  Lab Results  Component Value Date   HGBA1C 8.4 (H) 09/01/2023    Lab Results  Component Value Date   CREATININE 1.78 (H) 09/07/2023   BUN 20 09/07/2023   NA 137 09/07/2023   K 4.6 09/07/2023   CL 101 09/07/2023   CO2 27 09/07/2023    Lab Results  Component Value Date   CHOL 127 09/01/2023   HDL 44.50 09/01/2023   LDLCALC 42 09/01/2023   LDLDIRECT 53.0 10/23/2022   TRIG 204.0 (H) 09/01/2023   CHOLHDL 3 09/01/2023    Medications Reviewed Today     Reviewed by Henrene Pastor, RPH-CPP (Pharmacist) on 09/22/23 at 1048  Med List Status: <None>   Medication  Order Taking? Sig Documenting Provider Last Dose Status Informant  aspirin 81 MG EC tablet [id number] Yes Take 1 tablet (81 mg total) by mouth daily. [name], MD Taking Active   atorvastatin (LIPITOR) 40 MG tablet [id number] Yes TAKE 1 TABLET BY MOUTH ONCE DAILY AT  6  PM. [name], MD Taking Active   blood glucose meter kit and supplies [id number] Yes Dispense based on patient and insurance preference. Use once per day and if symptoms of low blood sugar. (FOR ICD-10 E10.9, E11.9). [name], MD Taking Active   dapagliflozin propanediol  (FARXIGA) 10 MG TABS tablet [id number] Yes Take 1 tablet (10 mg total) by mouth daily before breakfast. [name], MD Taking Active            Med Note [facility]   Tue Sep 22, 2023  8:12 AM) [place] th[place] 07/20/2024 - send Rx to [facility]  fluticasone (FLONASE) 50 MCG/ACT nasal spray [id number] Yes Place 2 sprays into both nostrils daily.  Patient taking differently: Place 2 sprays into both nostrils daily. As needed   [name], MD Taking Active   glipiZIDE (GLUCOTROL) 5 MG tablet [id number] Yes Take 1.5 tablets (7.5 mg total) by mouth daily before breakfast. [name], MD Taking Active   glucose blood (ACCU-CHEK GUIDE) test strip [id number] Yes Use to check blood glucose once a day. (DX: E11.9 - type 2 diabetes) [name], MD Taking Active   lisinopril (ZESTRIL) 20 MG tablet [id number] Yes Take 1 tablet (20 mg total) by mouth daily. [name], MD Taking Active   omeprazole (PRILOSEC) 20 MG capsule [id number] Yes Take 1 capsule (20 mg total) by mouth daily. [name], MD Taking Active               Assessment/Plan:   Diabetes:Currently not at goal A1c but home blood glucose readings have improved.  - Continue Farxiga 10mg  daily and glipizide 5mg  - take 1.5 tablets = 7.5mg  daily.   - Recommend to check glucose daily - Reviewed home goals  Fasting blood glucose goal (before meals) = 80 to 130 Blood glucose goal after a meal = less than 180   - Meets financial criteria for [name]. [place]20/2024   Hypertension:  - Continue to check blood pressure once daily and record.  Will forward current readings to Dr Neva Seat.  - Continue lisinopril 20mg  daily.   Follow Up Plan: 4 weeks to check on blood pressure and blood glucose   Henrene Pastor, PharmD Clinical Pharmacist [facility]  Population  Health [phone]

## 2023-09-22 NOTE — Telephone Encounter (Signed)
 Attempt was made to contact patient by phone today for follow up by Clinical Pharmacist regarding Farxiga dose increase and to see if he had [place].  Unable to reach patient. LM on VM with my contact number [phone].

## 2023-10-23 ENCOUNTER — Other Ambulatory Visit: Payer: Self-pay | Admitting: Pharmacist

## 2023-10-23 ENCOUNTER — Telehealth: Payer: Self-pay | Admitting: Pharmacist

## 2023-10-23 NOTE — Progress Notes (Signed)
 10/23/2023 Name: Gabriel VONDRASEK Sr. MRN: [id number] DOB: Jul 23, 1940  Chief Complaint  Patient presents with   Medication Management   Diabetes    Gabriel VANHORN Sr. is a 83 y.o. year old male who presented for a telephone visit.   They were referred to the pharmacist by their PCP for assistance in managing medication access.    Subjective:  Care Team: Primary Care Provider: [name], MD ; Next Scheduled Visit: 11/25/2023 Nephrologist - [facility] - Gabriel Gabriel Wu or Gabriel Wu, [facility] - last office visit was 08/13/2023 (has appointment in the next 3 to 4 week per patient) Podiatrist - initial visit scheduled for 09/10/2023 but was cancelled due to expected bad weather.   Medication Access/Adherence  Current Pharmacy:  [facility] [address] ([place]), [place] - [address] [place] ([place]) [place] [address] Phone: [phone] Fax: [phone]  [facility] - [place], [place] - [address] [place] [address] Phone: [phone] Fax: [phone]   Patient reports affordability concerns with their medications: Yes  Patient reports access/transportation concerns to their pharmacy: No  Patient reports adherence concerns with their medications:  No     Patient has CKD 3B and type 2 DM.  Current medication: Farxiga 10mg  daily and  glipizide 5mg  - take 1.5 tablet daily.   Previously was taking Jardiance 10mg  daily but stopped in 2024 due to cost of >$150. Jardiance was changed to [name] 5mg  daily. Patient [place]. He endorses that he received a [place] from [place] this week with new dose of Farxiga 10mg .   Last A1c was 8.4%. (had increased from 8.2% - Gabriel Wu increased Gabriel Wu to 10mg  daily.   Recent blood glucose readings per patient: 190 today - this was 3 hours after breakfast;  Per patient other blood glucose readings have been 110's to  170's - today's reading of 190 if not usual.  Denies signs of hypoglycemia- shakiness, sweating, weakness,  Denies signs of hyperglycemia - polyuria or polydipsia  Lab Results  Component Value Date   NA 137 09/07/2023   CL 101 09/07/2023   K 4.6 09/07/2023   CO2 27 09/07/2023   BUN 20 09/07/2023   CREATININE 1.78 (H) 09/07/2023   GFR 35.16 (L) 09/07/2023   CALCIUM 9.9 09/07/2023   PHOS 1.5 (L) 02/14/2022   ALBUMIN 4.6 09/07/2023   GLUCOSE 101 (H) 09/07/2023      Objective:  Lab Results  Component Value Date   HGBA1C 8.4 (H) 09/01/2023    Lab Results  Component Value Date   CREATININE 1.78 (H) 09/07/2023   BUN 20 09/07/2023   NA 137 09/07/2023   K 4.6 09/07/2023   CL 101 09/07/2023   CO2 27 09/07/2023    Lab Results  Component Value Date   CHOL 127 09/01/2023   HDL 44.50 09/01/2023   LDLCALC 42 09/01/2023   LDLDIRECT 53.0 10/23/2022   TRIG 204.0 (H) 09/01/2023   CHOLHDL 3 09/01/2023    Medications Reviewed Today     Reviewed by Gabriel Wu, RPH-CPP (Pharmacist) on 10/23/23 at 1209  Med List Status: <None>   Medication Order Taking? Sig Documenting Provider Last Dose Status Informant  aspirin 81 MG EC tablet [id number] Yes Take 1 tablet (81 mg total) by mouth daily. Gabriel Flood, MD Taking Active   atorvastatin (LIPITOR) 40 MG tablet [id number] Yes TAKE 1  TABLET BY MOUTH ONCE DAILY AT  6  PM. Gabriel Flood, MD Taking Active   blood glucose meter kit and supplies [id number] Yes Dispense based on patient and insurance preference. Use once per day and if symptoms of low blood sugar. (FOR ICD-10 E10.9, E11.9). Gabriel Flood, MD Taking Active   dapagliflozin propanediol (FARXIGA) 10 MG TABS tablet [id number] Yes Take 1 tablet (10 mg total) by mouth daily before breakfast. Gabriel Flood, MD Taking Active            Med Note [facility]   Tue Sep 22, 2023  8:12 AM) [place] th[place] 07/20/2024 - send Rx to [facility]   fluticasone (FLONASE) 50 MCG/ACT nasal spray [id number]  Place 2 sprays into both nostrils daily.  Patient taking differently: Place 2 sprays into both nostrils daily. As needed   Gabriel Flood, MD  Active   glipiZIDE (GLUCOTROL) 5 MG tablet [id number] Yes Take 1.5 tablets (7.5 mg total) by mouth daily before breakfast. Gabriel Flood, MD Taking Active   glucose blood (ACCU-CHEK GUIDE) test strip [id number] Yes Use to check blood glucose once a day. (DX: E11.9 - type 2 diabetes) Gabriel Flood, MD Taking Active   lisinopril (ZESTRIL) 20 MG tablet [id number] Yes Take 1 tablet (20 mg total) by mouth daily. Gabriel Flood, MD Taking Active   omeprazole (PRILOSEC) 20 MG capsule [id number] Yes Take 1 capsule by mouth once daily Gabriel Flood, MD Taking Active               Assessment/Plan:   Diabetes:Currently not at goal A1c but home blood glucose readings have improved some  - Continue Farxiga 10mg  daily and glipizide 5mg  - take 1.5 tablets = 7.5mg  daily.  Could consider increasing glipizide to 10mg  or adding GLP1 (if cost is an issue he would likely qualify for [place] medication assistance program)  - Recommend to continue to check glucose daily - Reviewed home goals  Fasting blood glucose goal (before meals) = 80 to 130 Blood glucose goal after a meal = less than 180   - Meets financial criteria for [name] patient assistance program. [place] thru 07/20/2024    Follow Up Plan: 4 weeks with Gabriel Wu; Clinical Pharmacist will check back with patient in 6 to 8 weeks.   Gabriel Wu, PharmD Clinical Pharmacist [facility]  Population Health [phone]

## 2023-10-23 NOTE — Telephone Encounter (Signed)
 Attempt was made to contact patient by phone today for follow up by Clinical Pharmacist regarding medication assistance program and to make sure he has received new dose of Farxiga from [facility] and Me program. Unable to reach patient. LM on VM with my contact number [phone].

## 2023-10-23 NOTE — Telephone Encounter (Signed)
Patient called back - see phone visit notes.

## 2023-10-27 NOTE — Progress Notes (Addendum)
 Note reviewed, with elevated blood sugars I think it would be reasonable to increase glipizide to 5 mg twice per day with meals instead of 1.5 tablets daily.  Keep follow-up with me in May, watch for any low blood sugar symptoms and if those occur should return back to previous dose.  Let me know if he has questions.

## 2023-10-28 ENCOUNTER — Telehealth: Payer: Self-pay

## 2023-10-28 DIAGNOSIS — R809 Proteinuria, unspecified: Secondary | ICD-10-CM

## 2023-10-28 NOTE — Telephone Encounter (Signed)
 Called patient to relay Dr.Greene's note. Verified with patient to take 1 tab twice daily. Patient verbalized understanding and no questions or concerns.

## 2023-10-28 NOTE — Telephone Encounter (Signed)
-----   Message from [name] sent at 10/27/2023  5:59 PM EDT ----- clinical pool  - please contact patient, have him try glipizide 5 mg twice per day with meals for improved blood sugar control but if he has any low blood sugar symptoms return to previous dose and let me know.  I do not expect that to occur.  Let me know if he has questions, and keep follow-up as scheduled in May.

## 2023-10-29 DIAGNOSIS — H401112 Primary open-angle glaucoma, right eye, moderate stage: Secondary | ICD-10-CM | POA: Diagnosis not present

## 2023-10-29 DIAGNOSIS — H40022 Open angle with borderline findings, high risk, left eye: Secondary | ICD-10-CM | POA: Diagnosis not present

## 2023-11-02 ENCOUNTER — Other Ambulatory Visit: Payer: Self-pay

## 2023-11-02 DIAGNOSIS — I739 Peripheral vascular disease, unspecified: Secondary | ICD-10-CM

## 2023-11-09 NOTE — Progress Notes (Unsigned)
 [facility] [place]  ASSESSMENT / PLAN: Gabriel INGLE Sr. is a 83 y.o. male with right foot numbness.  Normal vascular physical exam.  Normal noninvasive testing today in clinic.  No evidence of hemodynamically significant peripheral arterial disease.  Patient can follow-up with me as needed.  CHIEF COMPLAINT: Right foot numbness  HISTORY OF PRESENT ILLNESS: Gabriel SONDGEROTH Sr. is a 83 y.o. male with multiple health challenges including hyperglycemia, chronic kidney disease.  Patient presents to clinic for evaluation of possible peripheral arterial disease.  The patient reports month-long history of right foot numbness and paresthesias.  He has no symptoms typical of claudication, ischemic rest pain, or ischemic ulceration.  Past Medical History:  Diagnosis Date   Hypertension    Urolithiasis    Past stone spontaneously per patient.    No past surgical history on file.  Family History  Problem Relation Age of Onset   Leukemia Mother    Heart attack Father    Stomach cancer Sister     Social History   Socioeconomic History   Marital status: Single    Spouse name: Not on file   Number of children: 2   Years of education: Not on file   Highest education level: Not on file  Occupational History   Occupation: retired  Tobacco Use   Smoking status: Former    Types: Cigars    Quit date: 07/21/2010    Years since quitting: 13.3   Smokeless tobacco: Never  Vaping Use   Vaping status: Never Used  Substance and Sexual Activity   Alcohol use: No   Drug use: No   Sexual activity: Never  Other Topics Concern   Not on file  Social History Narrative   Lives alone - sons live in [place]   Social Drivers of Health   Financial Resource Strain: Low Risk  (08/29/2021)   Overall Financial Resource Strain (CARDIA)    Difficulty of Paying Living Expenses: Not hard at all  Food Insecurity: No Food Insecurity (08/29/2021)   Hunger Vital Sign     Worried About Running Out of Food in the Last Year: Never true    Ran Out of Food in the Last Year: Never true  Transportation Needs: No Transportation Needs (08/29/2021)   PRAPARE - Administrator, Civil Service (Medical): No    Lack of Transportation (Non-Medical): No  Physical Activity: Insufficiently Active (08/29/2021)   Exercise Vital Sign    Days of Exercise per Week: 4 days    Minutes of Exercise per Session: 30 min  Stress: No Stress Concern Present (08/29/2021)   [employer] of Occupational Health - Occupational Stress Questionnaire    Feeling of Stress : Not at all  Social Connections: Socially Isolated (08/29/2021)   Social Connection and Isolation Panel [NHANES]    Frequency of Communication with Friends and Family: More than three times a week    Frequency of Social Gatherings with Friends and Family: More than three times a week    Attends Religious Services: Never    Database administrator or Organizations: No    Attends Banker Meetings: Never    Marital Status: Divorced  Catering manager Violence: Not At Risk (08/29/2021)   Humiliation, Afraid, Rape, and Kick questionnaire    Fear of Current or Ex-Partner: No    Emotionally Abused: No    Physically Abused: No    Sexually Abused: No    No Known Allergies  Current Outpatient Medications  Medication Sig Dispense Refill   aspirin  81 MG EC tablet Take 1 tablet (81 mg total) by mouth daily. 90 tablet 3   atorvastatin  (LIPITOR) 40 MG tablet TAKE 1 TABLET BY MOUTH ONCE DAILY AT  6  PM. 90 tablet 2   blood glucose meter kit and supplies Dispense based on patient and insurance preference. Use once per day and if symptoms of low blood sugar. (FOR ICD-10 E10.9, E11.9). 1 each 0   dapagliflozin  propanediol (FARXIGA ) 10 MG TABS tablet Take 1 tablet (10 mg total) by mouth daily before breakfast. 90 tablet 2   fluticasone  (FLONASE ) 50 MCG/ACT nasal spray Place 2 sprays into both nostrils daily. (Patient  taking differently: Place 2 sprays into both nostrils daily. As needed) 16 g 6   glipiZIDE  (GLUCOTROL ) 5 MG tablet Take 1.5 tablets (7.5 mg total) by mouth daily before breakfast. 135 tablet 1   glucose blood (ACCU-CHEK GUIDE) test strip Use to check blood glucose once a day. (DX: E11.9 - type 2 diabetes) 100 each 3   lisinopril  (ZESTRIL ) 20 MG tablet Take 1 tablet (20 mg total) by mouth daily. 90 tablet 1   omeprazole  (PRILOSEC ) 20 MG capsule Take 1 capsule by mouth once daily 90 capsule 0   No current facility-administered medications for this visit.    PHYSICAL EXAM Vitals:   11/10/23 1244  BP: (!) 146/79  Pulse: 91  SpO2: 97%  Weight: 204 lb (92.5 kg)  Height: 5\' 9"  (1.753 m)    Elderly man in no distress Regular rate and rhythm Unlabored breathing 2+ posterior tibial and dorsalis pedis pulses bilaterally   PERTINENT LABORATORY AND RADIOLOGIC DATA  Most recent CBC    Latest Ref Rng & Units 11/06/2022   11:31 AM 10/23/2022    2:37 PM 01/01/2022    2:55 PM  CBC  WBC 4.0 - 10.5 K/uL 10.5  11.4  10.1   Hemoglobin 13.0 - 17.0 g/dL 47.8  29.5  62.1   Hematocrit 39.0 - 52.0 % 45.0  45.1  43.8   Platelets 150.0 - 400.0 K/uL 247.0  259.0  264.0      Most recent CMP    Latest Ref Rng & Units 09/07/2023    2:07 PM 09/01/2023    1:36 PM 05/07/2023    4:06 PM  CMP  Glucose 70 - 99 mg/dL 308  98  657   BUN 6 - 23 mg/dL 20  26  17    Creatinine 0.40 - 1.50 mg/dL 8.46  9.62  9.52   Sodium 135 - 145 mEq/L 137  134  137   Potassium 3.5 - 5.1 mEq/L 4.6  3.9  4.8   Chloride 96 - 112 mEq/L 101  97  99   CO2 19 - 32 mEq/L 27  30  29    Calcium  8.4 - 10.5 mg/dL 9.9  9.8  84.1   Total Protein 6.0 - 8.3 g/dL 8.3  8.6  8.0   Total Bilirubin 0.2 - 1.2 mg/dL 0.4  0.5  0.4   Alkaline Phos 39 - 117 U/L 113  111  98   AST 0 - 37 U/L 16  17  19    ALT 0 - 53 U/L 15  15  18      Renal function CrCl cannot be calculated (Patient's most recent lab result is older than the maximum 21 days  allowed.).  Hgb A1c MFr Bld (%)  Date Value  09/01/2023 8.4 (H)  LDL Chol Calc (NIH)  Date Value Ref Range Status  05/14/2020 53 0 - 99 mg/dL Final   LDL Cholesterol  Date Value Ref Range Status  09/01/2023 42 0 - 99 mg/dL Final   Direct LDL  Date Value Ref Range Status  10/23/2022 53.0 mg/dL Final    Comment:    Optimal:  <100 mg/dLNear or Above Optimal:  100-129 mg/dLBorderline High:  130-159 mg/dLHigh:  160-189 mg/dLVery High:  >190 mg/dL     +-------+-----------+-----------+------------+------------+  ABI/TBIToday's ABIToday's TBIPrevious ABIPrevious TBI  +-------+-----------+-----------+------------+------------+  Right 1.12       0.8                                  +-------+-----------+-----------+------------+------------+  Left  1.16       0.84                                 +-------+-----------+-----------+------------+------------+       Heber Little. Edgardo Goodwill, MD FACS [facility] Phone Number: [phone] 11/10/2023 2:50 PM   Total time spent on preparing this encounter including chart review, data review, collecting history, examining the patient, coordinating care for this new patient, 45 minutes.  Portions of this report may have been transcribed using voice recognition software.  Every effort has been made to ensure accuracy; however, inadvertent computerized transcription errors may still be present.

## 2023-11-10 ENCOUNTER — Encounter: Payer: Self-pay | Admitting: Vascular Surgery

## 2023-11-10 ENCOUNTER — Ambulatory Visit (INDEPENDENT_AMBULATORY_CARE_PROVIDER_SITE_OTHER)

## 2023-11-10 ENCOUNTER — Ambulatory Visit: Admitting: Vascular Surgery

## 2023-11-10 VITALS — BP 146/79 | HR 91 | Ht 69.0 in | Wt 204.0 lb

## 2023-11-10 DIAGNOSIS — R202 Paresthesia of skin: Secondary | ICD-10-CM

## 2023-11-10 DIAGNOSIS — N1832 Chronic kidney disease, stage 3b: Secondary | ICD-10-CM | POA: Diagnosis not present

## 2023-11-10 DIAGNOSIS — I739 Peripheral vascular disease, unspecified: Secondary | ICD-10-CM | POA: Diagnosis not present

## 2023-11-11 LAB — VAS US ABI WITH/WO TBI
Left ABI: 1.16
Right ABI: 1.12

## 2023-11-18 DIAGNOSIS — E1122 Type 2 diabetes mellitus with diabetic chronic kidney disease: Secondary | ICD-10-CM | POA: Diagnosis not present

## 2023-11-18 DIAGNOSIS — N1832 Chronic kidney disease, stage 3b: Secondary | ICD-10-CM | POA: Diagnosis not present

## 2023-11-18 DIAGNOSIS — E785 Hyperlipidemia, unspecified: Secondary | ICD-10-CM | POA: Diagnosis not present

## 2023-11-18 DIAGNOSIS — I129 Hypertensive chronic kidney disease with stage 1 through stage 4 chronic kidney disease, or unspecified chronic kidney disease: Secondary | ICD-10-CM | POA: Diagnosis not present

## 2023-11-25 ENCOUNTER — Encounter: Payer: Self-pay | Admitting: Family Medicine

## 2023-11-25 ENCOUNTER — Ambulatory Visit (INDEPENDENT_AMBULATORY_CARE_PROVIDER_SITE_OTHER): Payer: Medicare HMO | Admitting: Family Medicine

## 2023-11-25 DIAGNOSIS — N1832 Chronic kidney disease, stage 3b: Secondary | ICD-10-CM | POA: Diagnosis not present

## 2023-11-25 DIAGNOSIS — Z7984 Long term (current) use of oral hypoglycemic drugs: Secondary | ICD-10-CM

## 2023-11-25 DIAGNOSIS — R7989 Other specified abnormal findings of blood chemistry: Secondary | ICD-10-CM | POA: Diagnosis not present

## 2023-11-25 DIAGNOSIS — E1129 Type 2 diabetes mellitus with other diabetic kidney complication: Secondary | ICD-10-CM

## 2023-11-25 DIAGNOSIS — E785 Hyperlipidemia, unspecified: Secondary | ICD-10-CM | POA: Diagnosis not present

## 2023-11-25 DIAGNOSIS — R809 Proteinuria, unspecified: Secondary | ICD-10-CM

## 2023-11-25 DIAGNOSIS — I10 Essential (primary) hypertension: Secondary | ICD-10-CM | POA: Diagnosis not present

## 2023-11-25 DIAGNOSIS — K219 Gastro-esophageal reflux disease without esophagitis: Secondary | ICD-10-CM

## 2023-11-25 LAB — COMPREHENSIVE METABOLIC PANEL WITH GFR
ALT: 14 U/L (ref 0–53)
AST: 17 U/L (ref 0–37)
Albumin: 4.5 g/dL (ref 3.5–5.2)
Alkaline Phosphatase: 111 U/L (ref 39–117)
BUN: 22 mg/dL (ref 6–23)
CO2: 27 meq/L (ref 19–32)
Calcium: 9.9 mg/dL (ref 8.4–10.5)
Chloride: 102 meq/L (ref 96–112)
Creatinine, Ser: 1.79 mg/dL — ABNORMAL HIGH (ref 0.40–1.50)
GFR: 34.87 mL/min — ABNORMAL LOW (ref >60.00)
Glucose, Bld: 147 mg/dL — ABNORMAL HIGH (ref 70–99)
Potassium: 4.1 meq/L (ref 3.5–5.1)
Sodium: 136 meq/L (ref 135–145)
Total Bilirubin: 0.5 mg/dL (ref 0.2–1.2)
Total Protein: 7.8 g/dL (ref 6.0–8.3)

## 2023-11-25 MED ORDER — GLIPIZIDE 5 MG PO TABS: 7.5000 mg | ORAL_TABLET | Freq: Every day | ORAL | 1 refills | Status: DC

## 2023-11-25 MED ORDER — ATORVASTATIN CALCIUM 40 MG PO TABS: ORAL_TABLET | ORAL | 2 refills | Status: AC

## 2023-11-25 MED ORDER — LISINOPRIL 20 MG PO TABS: 20.0000 mg | ORAL_TABLET | Freq: Every day | ORAL | 1 refills | Status: DC

## 2023-11-25 MED ORDER — OMEPRAZOLE 20 MG PO CPDR: 20.0000 mg | DELAYED_RELEASE_CAPSULE | Freq: Every day | ORAL | 0 refills | Status: DC

## 2023-11-25 NOTE — Progress Notes (Signed)
 Subjective:  Patient ID: Gabriel Hacking Sr., male    DOB: 01-01-1941  Age: 83 y.o. MRN: [id number]  CC:  Chief Complaint  Patient presents with   Medical Management of Chronic Issues    Pt is doing well, no questions at this time     HPI Gabriel E Stehle Sr. presents for   Diabetes: Complicated by hyperglycemia, microalbuminuria with CKD followed by nephrology.  Metformin  was discontinued due to decreased GFR on Farxiga  through medication assistance program and glipizide .  He is also followed by clinical pharmacist.  He is on ACE inhibitor with lisinopril  and statin with Lipitor.  Most recent A1c 8.4 in February.  Creatinine similar to previous range.  Farxiga  5 mg daily and glipizide  7.5 mg in the morning at that time.  Plan for increase in the Farxiga  to 10 mg initially with repeat labs today, continued on glipizide  same dose. Appointment with pharmacist noted on March 4.  On medication assistance program for Farxiga  through 07/20/2024, continued on lisinopril  20 mg daily, improving readings at that time.  Taking glipizide  5mg  BID.  Denies any side effects with medications. No mycotic or uti symptoms.  Home readings: 147 this am. No 200's. No symptomatic lows.  Heartburn controlled with omeprazole .  Microalbumin: 05/21/23 - on ACE- I Optho, foot exam, pneumovax: UTD  Lab Results  Component Value Date   HGBA1C 8.4 (H) 09/01/2023   HGBA1C 8.2 (H) 05/07/2023   HGBA1C 8.2 (H) 02/04/2023   Lab Results  Component Value Date   MICROALBUR 4.1 (H) 05/21/2023   LDLCALC 42 09/01/2023   CREATININE 1.78 (H) 09/07/2023    History Patient Active Problem List   Diagnosis Date Noted   DM (diabetes mellitus) (HCC) 05/12/2016   Hyperlipemia    Chest pain 04/12/2016   Essential hypertension 04/12/2016   Dyspepsia 04/12/2016   Past Medical History:  Diagnosis Date   Hypertension    Urolithiasis    Past stone spontaneously per patient.   No past surgical history on  file. No Known Allergies Prior to Admission medications   Medication Sig Start Date End Date Taking? Authorizing Provider  aspirin  81 MG EC tablet Take 1 tablet (81 mg total) by mouth daily. 08/29/19  Yes Benjiman Bras, MD  atorvastatin  (LIPITOR) 40 MG tablet TAKE 1 TABLET BY MOUTH ONCE DAILY AT  6  PM. 10/23/22  Yes Benjiman Bras, MD  blood glucose meter kit and supplies Dispense based on patient and insurance preference. Use once per day and if symptoms of low blood sugar. (FOR ICD-10 E10.9, E11.9). 01/01/22  Yes Benjiman Bras, MD  dapagliflozin  propanediol (FARXIGA ) 10 MG TABS tablet Take 1 tablet (10 mg total) by mouth daily before breakfast. 09/08/23  Yes Benjiman Bras, MD  fluticasone  (FLONASE ) 50 MCG/ACT nasal spray Place 2 sprays into both nostrils daily. 04/21/22  Yes Benjiman Bras, MD  glipiZIDE  (GLUCOTROL ) 5 MG tablet Take 1.5 tablets (7.5 mg total) by mouth daily before breakfast. 07/02/23  Yes Benjiman Bras, MD  glucose blood (ACCU-CHEK GUIDE) test strip Use to check blood glucose once a day. (DX: E11.9 - type 2 diabetes) 05/21/23  Yes Benjiman Bras, MD  lisinopril  (ZESTRIL ) 20 MG tablet Take 1 tablet (20 mg total) by mouth daily. 08/07/23  Yes Benjiman Bras, MD  omeprazole  (PRILOSEC ) 20 MG capsule Take 1 capsule by mouth once daily 09/22/23  Yes Benjiman Bras, MD   Social History   Socioeconomic History  Marital status: Single    Spouse name: Not on file   Number of children: 2   Years of education: Not on file   Highest education level: Not on file  Occupational History   Occupation: retired  Tobacco Use   Smoking status: Former    Types: Cigars    Quit date: 07/21/2010    Years since quitting: 13.3   Smokeless tobacco: Never  Vaping Use   Vaping status: Never Used  Substance and Sexual Activity   Alcohol use: No   Drug use: No   Sexual activity: Never  Other Topics Concern   Not on file  Social History Narrative   Lives alone - sons live  in [place]   Social Drivers of Health   Financial Resource Strain: Low Risk  (08/29/2021)   Overall Financial Resource Strain (CARDIA)    Difficulty of Paying Living Expenses: Not hard at all  Food Insecurity: No Food Insecurity (08/29/2021)   Hunger Vital Sign    Worried About Running Out of Food in the Last Year: Never true    Ran Out of Food in the Last Year: Never true  Transportation Needs: No Transportation Needs (08/29/2021)   PRAPARE - Administrator, Civil Service (Medical): No    Lack of Transportation (Non-Medical): No  Physical Activity: Insufficiently Active (08/29/2021)   Exercise Vital Sign    Days of Exercise per Week: 4 days    Minutes of Exercise per Session: 30 min  Stress: No Stress Concern Present (08/29/2021)   [employer] of Occupational Health - Occupational Stress Questionnaire    Feeling of Stress : Not at all  Social Connections: Socially Isolated (08/29/2021)   Social Connection and Isolation Panel [NHANES]    Frequency of Communication with Friends and Family: More than three times a week    Frequency of Social Gatherings with Friends and Family: More than three times a week    Attends Religious Services: Never    Database administrator or Organizations: No    Attends Banker Meetings: Never    Marital Status: Divorced  Catering manager Violence: Not At Risk (08/29/2021)   Humiliation, Afraid, Rape, and Kick questionnaire    Fear of Current or Ex-Partner: No    Emotionally Abused: No    Physically Abused: No    Sexually Abused: No    Review of Systems  Constitutional:  Negative for fatigue and unexpected weight change.  Eyes:  Negative for visual disturbance.  Respiratory:  Negative for cough, chest tightness and shortness of breath.   Cardiovascular:  Negative for chest pain, palpitations and leg swelling.  Gastrointestinal:  Negative for abdominal pain and blood in stool.  Neurological:  Negative for dizziness,  light-headedness and headaches.     Objective:   Vitals:   11/25/23 1308  BP: 122/60  Pulse: 85  Temp: 98.3 F (36.8 C)  TempSrc: Temporal  SpO2: 96%  Weight: 203 lb 6.4 oz (92.3 kg)  Height: 5\' 9"  (1.753 m)     Physical Exam Vitals reviewed.  Constitutional:      Appearance: He is well-developed.  HENT:     Head: Normocephalic and atraumatic.  Neck:     Vascular: No carotid bruit or JVD.  Cardiovascular:     Rate and Rhythm: Normal rate and regular rhythm.     Heart sounds: Normal heart sounds. No murmur heard. Pulmonary:     Effort: Pulmonary effort is normal.     Breath  sounds: Normal breath sounds. No rales.  Musculoskeletal:     Right lower leg: No edema.     Left lower leg: No edema.  Skin:    General: Skin is warm and dry.  Neurological:     Mental Status: He is alert and oriented to person, place, and time.  Psychiatric:        Mood and Affect: Mood normal.     Assessment & Plan:  ZACARIAS DILALLO Sr. is a 83 y.o. male . Hyperlipidemia, unspecified hyperlipidemia type - Plan: atorvastatin  (LIPITOR) 40 MG tablet  Type 2 diabetes mellitus with microalbuminuria, without long-term current use of insulin (HCC) - Plan: glipiZIDE  (GLUCOTROL ) 5 MG tablet, Comprehensive metabolic panel with GFR  Stage 3b chronic kidney disease (HCC) - Plan: lisinopril  (ZESTRIL ) 20 MG tablet  Elevated serum creatinine - Plan: lisinopril  (ZESTRIL ) 20 MG tablet  Essential hypertension - Plan: lisinopril  (ZESTRIL ) 20 MG tablet  Gastroesophageal reflux disease, unspecified whether esophagitis present - Plan: omeprazole  (PRILOSEC ) 20 MG capsule  Diabetes complicated by CKD as above, uncontrolled on last A1c but improved readings and no symptomatic lows.  Check A1c, CMP, and adjust plan accordingly.  Tolerating higher dose of Farxiga  and now on the 5 mg twice daily of glipizide  no other med changes at this time, 13-month follow-up.  Meds ordered this encounter  Medications    atorvastatin  (LIPITOR) 40 MG tablet    Sig: TAKE 1 TABLET BY MOUTH ONCE DAILY AT  6  PM.    Dispense:  90 tablet    Refill:  2   glipiZIDE  (GLUCOTROL ) 5 MG tablet    Sig: Take 1.5 tablets (7.5 mg total) by mouth daily before breakfast.    Dispense:  135 tablet    Refill:  1   lisinopril  (ZESTRIL ) 20 MG tablet    Sig: Take 1 tablet (20 mg total) by mouth daily.    Dispense:  90 tablet    Refill:  1   omeprazole  (PRILOSEC ) 20 MG capsule    Sig: Take 1 capsule (20 mg total) by mouth daily.    Dispense:  90 capsule    Refill:  0   Patient Instructions  Thank you for coming today.  No change in medications at this time.  I will check some labs and if any changes needed we will let you know.  Follow-up in 3 months, sooner if any new concerns.  Take care!    Signed,   Caro Christmas, MD [facility], [facility] 11/25/23 1:29 PM

## 2023-11-25 NOTE — Patient Instructions (Signed)
 Thank you for coming today.  No change in medications at this time.  I will check some labs and if any changes needed we will let you know.  Follow-up in 3 months, sooner if any new concerns.  Take care!

## 2023-12-01 ENCOUNTER — Ambulatory Visit: Payer: Self-pay | Admitting: Family Medicine

## 2023-12-07 ENCOUNTER — Ambulatory Visit: Payer: Medicare HMO | Admitting: Family Medicine

## 2023-12-22 ENCOUNTER — Telehealth: Payer: Self-pay | Admitting: Pharmacist

## 2023-12-22 ENCOUNTER — Other Ambulatory Visit: Payer: Self-pay | Admitting: Pharmacist

## 2023-12-22 NOTE — Progress Notes (Signed)
 Attempt was made to contact patient by phone today for follow up by Clinical Pharmacist regarding diabetes and medication management Unable to reach patient. LM on VM with my contact number [phone].

## 2024-01-11 ENCOUNTER — Telehealth: Payer: Self-pay | Admitting: Pharmacist

## 2024-01-11 DIAGNOSIS — E1129 Type 2 diabetes mellitus with other diabetic kidney complication: Secondary | ICD-10-CM

## 2024-01-11 MED ORDER — DAPAGLIFLOZIN PROPANEDIOL 10 MG PO TABS: 10.0000 mg | ORAL_TABLET | Freq: Every day | ORAL | 2 refills | Status: DC

## 2024-01-11 NOTE — Progress Notes (Signed)
 Spoke with patient today he has one a bottle of Farxiga  at home. [place] updated [place]  to [place] [facility] [place] [facility].  Gabriel Wu reports his blood glucose has been around 140's most of the time.  No changes in medications for diabetes recommended at this time. He has a follow up with Dr Levora 02/25/2024 and with me in October 2025.

## 2024-01-11 NOTE — Telephone Encounter (Signed)
 [facility] / [place] medication assistance program needed new Rx for Farxiga  10mg  - Rx sent to them electronically.  Spoke with patient to see how much Farxiga  he has on hand. He reported he has about 1 bottle of 30 tablets so has plenty on hand to last until next shipment arrives.

## 2024-01-14 NOTE — Telephone Encounter (Signed)
 Patient's Farxiga  was shipped 01/11/2024 with tracking number of [id number] ([employer]ru [employer])

## 2024-02-25 ENCOUNTER — Encounter: Payer: Self-pay | Admitting: Family Medicine

## 2024-02-25 ENCOUNTER — Ambulatory Visit (INDEPENDENT_AMBULATORY_CARE_PROVIDER_SITE_OTHER): Admitting: Family Medicine

## 2024-02-25 VITALS — BP 148/76 | HR 85 | Temp 98.0°F | Resp 18 | Ht 69.0 in | Wt 204.6 lb

## 2024-02-25 DIAGNOSIS — I10 Essential (primary) hypertension: Secondary | ICD-10-CM | POA: Diagnosis not present

## 2024-02-25 DIAGNOSIS — N1832 Chronic kidney disease, stage 3b: Secondary | ICD-10-CM | POA: Diagnosis not present

## 2024-02-25 DIAGNOSIS — E785 Hyperlipidemia, unspecified: Secondary | ICD-10-CM

## 2024-02-25 DIAGNOSIS — E1129 Type 2 diabetes mellitus with other diabetic kidney complication: Secondary | ICD-10-CM

## 2024-02-25 DIAGNOSIS — R809 Proteinuria, unspecified: Secondary | ICD-10-CM | POA: Diagnosis not present

## 2024-02-25 DIAGNOSIS — R7989 Other specified abnormal findings of blood chemistry: Secondary | ICD-10-CM

## 2024-02-25 DIAGNOSIS — K219 Gastro-esophageal reflux disease without esophagitis: Secondary | ICD-10-CM

## 2024-02-25 LAB — HEMOGLOBIN A1C: Hgb A1c MFr Bld: 7.8 % — ABNORMAL HIGH (ref 4.6–6.5)

## 2024-02-25 MED ORDER — LISINOPRIL 20 MG PO TABS: 20.0000 mg | ORAL_TABLET | Freq: Every day | ORAL | 1 refills | Status: AC

## 2024-02-25 MED ORDER — LISINOPRIL 10 MG PO TABS: 10.0000 mg | ORAL_TABLET | Freq: Every day | ORAL | 1 refills | Status: DC

## 2024-02-25 MED ORDER — OMEPRAZOLE 20 MG PO CPDR: 20.0000 mg | DELAYED_RELEASE_CAPSULE | Freq: Every day | ORAL | 0 refills | Status: DC

## 2024-02-25 NOTE — Progress Notes (Signed)
 Subjective:  Patient ID: Gabriel FORBES Messing Sr., male    DOB: Nov 07, 1940  Age: 83 y.o. MRN: [id number]  CC:  Chief Complaint  Patient presents with   Follow-up     No questions or concerns.    HPI Gabriel Fjeld Topper Sr. presents for  Diabetes: Complicated by hyperglycemia, microalbuminuria with CKD followed by nephrology -Dr. Macel.  metformin  previously discontinued due to decreased GFR.  Treated with glipizide , Farxiga  through medication assistance program.  Followed by clinical pharmacist.  On ACE inhibitor with lisinopril , statin with Lipitor. Last visit in May, glipizide  5 mg twice daily.  Farxiga  now at 10 mg daily.  Denies mycotic or UTI symptoms or side effects with meds. Out of glipizide  - out 2 days ago - backordered but available to pickup today.  Home readings  Fasting: none Postprandial: 137 No symptomatic lows.  Microalbumin: Last tested in October 2024, he is on ACE inhibitor. Optho, foot exam, pneumovax:  Ophthalmology exam: appt next werek.  Flu vaccine recommended when available.  Tdap as option at his pharmacy.  Due for annual wellness visit - will schedule.  Lab Results  Component Value Date   HGBA1C 8.4 (H) 09/01/2023   HGBA1C 8.2 (H) 05/07/2023   HGBA1C 8.2 (H) 02/04/2023   Lab Results  Component Value Date   LDLCALC 42 09/01/2023   CREATININE 1.79 (H) 11/25/2023   GERD: Stable with prilosec  nightly. Breakthrough sx's if misses dose.   Nasal congestion stable with as needed flonase .   Hypertension: Lisinopril  20mg  every day. No missed doses. CKD, appt with nephrology in October.  Home readings: 126-147/75-80.  BP Readings from Last 3 Encounters:  02/25/24 (!) 148/76  11/25/23 122/60  11/10/23 (!) 146/79   Lab Results  Component Value Date   CREATININE 1.79 (H) 11/25/2023    Hyperlipidemia: Lipitor 40mg  every day. No new myalgia/side effects.  Lab Results  Component Value Date   CHOL 127 09/01/2023   HDL 44.50 09/01/2023    LDLCALC 42 09/01/2023   LDLDIRECT 53.0 10/23/2022   TRIG 204.0 (H) 09/01/2023   CHOLHDL 3 09/01/2023   Lab Results  Component Value Date   ALT 14 11/25/2023   AST 17 11/25/2023   ALKPHOS 111 11/25/2023   BILITOT 0.5 11/25/2023     History Patient Active Problem List   Diagnosis Date Noted   DM (diabetes mellitus) (HCC) 05/12/2016   Hyperlipemia    Chest pain 04/12/2016   Essential hypertension 04/12/2016   Dyspepsia 04/12/2016   Past Medical History:  Diagnosis Date   Hypertension    Urolithiasis    Past stone spontaneously per patient.   History reviewed. No pertinent surgical history. No Known Allergies Prior to Admission medications   Medication Sig Start Date End Date Taking? Authorizing Provider  aspirin  81 MG EC tablet Take 1 tablet (81 mg total) by mouth daily. 08/29/19  Yes Levora Reyes SAUNDERS, MD  atorvastatin  (LIPITOR) 40 MG tablet TAKE 1 TABLET BY MOUTH ONCE DAILY AT  6  PM. 11/25/23  Yes Levora Reyes SAUNDERS, MD  blood glucose meter kit and supplies Dispense based on patient and insurance preference. Use once per day and if symptoms of low blood sugar. (FOR ICD-10 E10.9, E11.9). 01/01/22  Yes Levora Reyes SAUNDERS, MD  dapagliflozin  propanediol (FARXIGA ) 10 MG TABS tablet Take 1 tablet (10 mg total) by mouth daily before breakfast. 01/11/24  Yes Levora Reyes SAUNDERS, MD  fluticasone  (FLONASE ) 50 MCG/ACT nasal spray Place 2 sprays into both nostrils daily. 04/21/22  Yes Levora Reyes SAUNDERS, MD  glipiZIDE  (GLUCOTROL ) 5 MG tablet Take 1.5 tablets (7.5 mg total) by mouth daily before breakfast. 11/25/23  Yes Levora Reyes SAUNDERS, MD  glucose blood (ACCU-CHEK GUIDE) test strip Use to check blood glucose once a day. (DX: E11.9 - type 2 diabetes) 05/21/23  Yes Levora Reyes SAUNDERS, MD  lisinopril  (ZESTRIL ) 20 MG tablet Take 1 tablet (20 mg total) by mouth daily. 11/25/23  Yes Levora Reyes SAUNDERS, MD  omeprazole  (PRILOSEC ) 20 MG capsule Take 1 capsule (20 mg total) by mouth daily. 11/25/23  Yes Levora Reyes SAUNDERS, MD   Social History   Socioeconomic History   Marital status: Single    Spouse name: Not on file   Number of children: 2   Years of education: Not on file   Highest education level: Not on file  Occupational History   Occupation: retired  Tobacco Use   Smoking status: Former    Types: Cigars    Quit date: 07/21/2010    Years since quitting: 13.6   Smokeless tobacco: Never  Vaping Use   Vaping status: Never Used  Substance and Sexual Activity   Alcohol use: No   Drug use: No   Sexual activity: Never  Other Topics Concern   Not on file  Social History Narrative   Lives alone - sons live in [place]   Social Drivers of Health   Financial Resource Strain: Low Risk  (08/29/2021)   Overall Financial Resource Strain (CARDIA)    Difficulty of Paying Living Expenses: Not hard at all  Food Insecurity: No Food Insecurity (08/29/2021)   Hunger Vital Sign    Worried About Running Out of Food in the Last Year: Never true    Ran Out of Food in the Last Year: Never true  Transportation Needs: No Transportation Needs (08/29/2021)   PRAPARE - Administrator, Civil Service (Medical): No    Lack of Transportation (Non-Medical): No  Physical Activity: Insufficiently Active (08/29/2021)   Exercise Vital Sign    Days of Exercise per Week: 4 days    Minutes of Exercise per Session: 30 min  Stress: No Stress Concern Present (08/29/2021)   [employer] of Occupational Health - Occupational Stress Questionnaire    Feeling of Stress : Not at all  Social Connections: Socially Isolated (08/29/2021)   Social Connection and Isolation Panel    Frequency of Communication with Friends and Family: More than three times a week    Frequency of Social Gatherings with Friends and Family: More than three times a week    Attends Religious Services: Never    Database administrator or Organizations: No    Attends Banker Meetings: Never    Marital Status: Divorced  Careers information officer Violence: Not At Risk (08/29/2021)   Humiliation, Afraid, Rape, and Kick questionnaire    Fear of Current or Ex-Partner: No    Emotionally Abused: No    Physically Abused: No    Sexually Abused: No    Review of Systems  Constitutional:  Negative for fatigue and unexpected weight change.  Eyes:  Negative for visual disturbance.  Respiratory:  Negative for cough, chest tightness and shortness of breath.   Cardiovascular:  Negative for chest pain, palpitations and leg swelling.  Gastrointestinal:  Negative for abdominal pain and blood in stool.  Neurological:  Negative for dizziness, light-headedness and headaches.     Objective:   Vitals:   02/25/24 1304  BP: (!) 148/76  Pulse: 85  Resp: 18  Temp: 98 F (36.7 C)  TempSrc: Temporal  SpO2: 97%  Weight: 204 lb 9.6 oz (92.8 kg)  Height: 5' 9 (1.753 m)     Physical Exam Vitals reviewed.  Constitutional:      Appearance: He is well-developed.  HENT:     Head: Normocephalic and atraumatic.  Neck:     Vascular: No carotid bruit or JVD.  Cardiovascular:     Rate and Rhythm: Normal rate and regular rhythm.     Heart sounds: Normal heart sounds. No murmur heard. Pulmonary:     Effort: Pulmonary effort is normal.     Breath sounds: Normal breath sounds. No rales.  Musculoskeletal:     Right lower leg: No edema.     Left lower leg: No edema.  Skin:    General: Skin is warm and dry.  Neurological:     Mental Status: He is alert and oriented to person, place, and time.  Psychiatric:        Mood and Affect: Mood normal.        Assessment & Plan:  Gabriel HALTERMAN Sr. is a 83 y.o. male . Hyperlipidemia, unspecified hyperlipidemia type - Plan: Lipid panel  - Tolerating current dose statin, continue same, check lipid panel, and CMP.  Type 2 diabetes mellitus with microalbuminuria, without long-term current use of insulin (HCC) - Plan: Urine Microalbumin w/creat. ratio, Comprehensive metabolic panel with GFR,  Hemoglobin A1c  - Tolerating Farxiga  and glipizide , adequate aside past few days but otherwise has been consistently using meds.  Denies any side effects.  Check updated labs as above and adjust plan accordingly.  Stage 3b chronic kidney disease (HCC) - Plan: lisinopril  (ZESTRIL ) 20 MG tablet Essential hypertension - Plan: lisinopril  (ZESTRIL ) 10 MG tablet, lisinopril  (ZESTRIL ) 20 MG tablet  - Elevated blood pressure, including on home readings, will add additional 10 mg lisinopril .  Total dose of 30 mg daily, on Farxiga  as above, continue follow-up with nephrologist as planned, 49-month follow-up with me.  RTC precautions if elevated or low blood pressures.  Gastroesophageal reflux disease, unspecified whether esophagitis present - Plan: omeprazole  (PRILOSEC ) 20 MG capsule  - Stable on meds, unfortunately unable to space out dosing due to breakthrough symptoms.  Elevated serum creatinine - Plan: lisinopril  (ZESTRIL ) 20 MG tablet As above, follow-up with nephrology as planned, check labs above.  Meds ordered this encounter  Medications   lisinopril  (ZESTRIL ) 10 MG tablet    Sig: Take 1 tablet (10 mg total) by mouth daily. Add to 20mg  for total dose 30mg  per day.    Dispense:  90 tablet    Refill:  1   omeprazole  (PRILOSEC ) 20 MG capsule    Sig: Take 1 capsule (20 mg total) by mouth daily.    Dispense:  90 capsule    Refill:  0   lisinopril  (ZESTRIL ) 20 MG tablet    Sig: Take 1 tablet (20 mg total) by mouth daily.    Dispense:  90 tablet    Refill:  1   Patient Instructions  Battery for blood sugar meter - [id number] - requires 2 batteries.   Blood pressure is running a little bit too high.  Add an additional 10 mg lisinopril  to your 20 mg dose.  Have sent that to your pharmacy.  This will be a total dose of 30 mg of lisinopril  per day.  No other med changes at this time.  If any concerns on labs I will  let you know.  Take care!      Signed,   Reyes Pines, MD [facility], [facility] 02/25/24 1:31 PM

## 2024-02-25 NOTE — Patient Instructions (Addendum)
 Battery for blood sugar meter - [id number] - requires 2 batteries.   Blood pressure is running a little bit too high.  Add an additional 10 mg lisinopril  to your 20 mg dose.  Have sent that to your pharmacy.  This will be a total dose of 30 mg of lisinopril  per day.  No other med changes at this time.  If any concerns on labs I will let you know.  Take care!

## 2024-02-26 LAB — COMPREHENSIVE METABOLIC PANEL WITH GFR
ALT: 23 U/L (ref 0–53)
AST: 18 U/L (ref 0–37)
Albumin: 4.5 g/dL (ref 3.5–5.2)
Alkaline Phosphatase: 101 U/L (ref 39–117)
BUN: 20 mg/dL (ref 6–23)
CO2: 20 meq/L (ref 19–32)
Calcium: 9.5 mg/dL (ref 8.4–10.5)
Chloride: 99 meq/L (ref 96–112)
Creatinine, Ser: 1.75 mg/dL — ABNORMAL HIGH (ref 0.40–1.50)
GFR: 35.76 mL/min — ABNORMAL LOW (ref >60.00)
Glucose, Bld: 133 mg/dL — ABNORMAL HIGH (ref 70–99)
Potassium: 4.3 meq/L (ref 3.5–5.1)
Sodium: 137 meq/L (ref 135–145)
Total Bilirubin: 0.5 mg/dL (ref 0.2–1.2)
Total Protein: 8.1 g/dL (ref 6.0–8.3)

## 2024-02-26 LAB — LIPID PANEL
Cholesterol: 110 mg/dL (ref 0–200)
HDL: 41.7 mg/dL (ref >39.00)
LDL Cholesterol: 42 mg/dL (ref 0–99)
NonHDL: 68.09
Total CHOL/HDL Ratio: 3
Triglycerides: 131 mg/dL (ref 0.0–149.0)
VLDL: 26.2 mg/dL (ref 0.0–40.0)

## 2024-03-01 ENCOUNTER — Ambulatory Visit: Admitting: *Deleted

## 2024-03-01 ENCOUNTER — Ambulatory Visit: Payer: Self-pay | Admitting: Family Medicine

## 2024-03-01 VITALS — Ht 69.0 in | Wt 204.0 lb

## 2024-03-01 DIAGNOSIS — Z0000 Encounter for general adult medical examination without abnormal findings: Secondary | ICD-10-CM | POA: Diagnosis not present

## 2024-03-01 NOTE — Progress Notes (Signed)
 Subjective:   Gabriel ESCO Sr. is a 83 y.o. male who presents for Medicare Annual/Subsequent preventive examination.  Visit Complete: Virtual I connected with  Gabriel Cropper Simmers Sr. on 03/01/24 by a audio enabled telemedicine application and verified that I am speaking with the correct person using two identifiers.  Patient Location: Home  Provider Location: Home Office  I discussed the limitations of evaluation and management by telemedicine. The patient expressed understanding and agreed to proceed.  Vital Signs: Because this visit was a virtual/telehealth visit, some criteria may be missing or patient reported. Any vitals not documented were not able to be obtained and vitals that have been documented are patient reported.   Cardiac Risk Factors include: advanced age (>34men, >65 women);male gender;obesity (BMI >30kg/m2);hypertension     Objective:    Today's Vitals   03/01/24 1428  Weight: 204 lb (92.5 kg)  Height: 5' 9 (1.753 m)   Body mass index is 30.13 kg/m.     03/01/2024    2:26 PM 08/29/2021    1:22 PM 07/10/2020   10:06 AM 05/31/2019   11:04 AM 04/12/2016    9:05 PM 04/12/2016    4:02 PM  Advanced Directives  Does Patient Have a Medical Advance Directive? Yes No Yes No Yes  No   Type of Advance Directive Living will  Living will Healthcare Power of [employer] Power of [place];Living will    Does patient want to make changes to medical advance directive?    No - Patient declined No - Patient declined    Copy of Healthcare Power of Attorney in Chart?     No - copy requested    Would patient like information on creating a medical advance directive?  No - Patient declined Yes (ED - Information included in AVS) No - Patient declined       Data saved with a previous flowsheet row definition    Current Medications (verified) Outpatient Encounter Medications as of 03/01/2024  Medication Sig   aspirin  81 MG EC tablet Take 1 tablet (81 mg total)  by mouth daily.   atorvastatin  (LIPITOR) 40 MG tablet TAKE 1 TABLET BY MOUTH ONCE DAILY AT  6  PM.   blood glucose meter kit and supplies Dispense based on patient and insurance preference. Use once per day and if symptoms of low blood sugar. (FOR ICD-10 E10.9, E11.9).   dapagliflozin  propanediol (FARXIGA ) 10 MG TABS tablet Take 1 tablet (10 mg total) by mouth daily before breakfast.   fluticasone  (FLONASE ) 50 MCG/ACT nasal spray Place 2 sprays into both nostrils daily.   glipiZIDE  (GLUCOTROL ) 5 MG tablet Take 1.5 tablets (7.5 mg total) by mouth daily before breakfast.   glucose blood (ACCU-CHEK GUIDE) test strip Use to check blood glucose once a day. (DX: E11.9 - type 2 diabetes)   lisinopril  (ZESTRIL ) 10 MG tablet Take 1 tablet (10 mg total) by mouth daily. Add to 20mg  for total dose 30mg  per day.   lisinopril  (ZESTRIL ) 20 MG tablet Take 1 tablet (20 mg total) by mouth daily.   omeprazole  (PRILOSEC ) 20 MG capsule Take 1 capsule (20 mg total) by mouth daily.   No facility-administered encounter medications on file as of 03/01/2024.    Allergies (verified) Patient has no known allergies.   History: Past Medical History:  Diagnosis Date   Hypertension    Urolithiasis    Past stone spontaneously per patient.   History reviewed. No pertinent surgical history. Family History  Problem Relation Age  of Onset   Leukemia Mother    Heart attack Father    Stomach cancer Sister    Social History   Socioeconomic History   Marital status: Single    Spouse name: Not on file   Number of children: 2   Years of education: Not on file   Highest education level: Not on file  Occupational History   Occupation: retired  Tobacco Use   Smoking status: Former    Types: Cigars    Quit date: 07/21/2010    Years since quitting: 13.6   Smokeless tobacco: Never  Vaping Use   Vaping status: Never Used  Substance and Sexual Activity   Alcohol use: No   Drug use: No   Sexual activity: Never  Other  Topics Concern   Not on file  Social History Narrative   Lives alone - sons live in [place]   Social Drivers of Health   Financial Resource Strain: Low Risk  (03/01/2024)   Overall Financial Resource Strain (CARDIA)    Difficulty of Paying Living Expenses: Not hard at all  Food Insecurity: No Food Insecurity (03/01/2024)   Hunger Vital Sign    Worried About Running Out of Food in the Last Year: Never true    Ran Out of Food in the Last Year: Never true  Transportation Needs: No Transportation Needs (03/01/2024)   PRAPARE - Administrator, Civil Service (Medical): No    Lack of Transportation (Non-Medical): No  Physical Activity: Inactive (03/01/2024)   Exercise Vital Sign    Days of Exercise per Week: 0 days    Minutes of Exercise per Session: 0 min  Stress: No Stress Concern Present (03/01/2024)   [employer] of Occupational Health - Occupational Stress Questionnaire    Feeling of Stress: Not at all  Social Connections: Socially Isolated (03/01/2024)   Social Connection and Isolation Panel    Frequency of Communication with Friends and Family: More than three times a week    Frequency of Social Gatherings with Friends and Family: More than three times a week    Attends Religious Services: Never    Database administrator or Organizations: No    Attends Banker Meetings: Never    Marital Status: Widowed    Tobacco Counseling Counseling given: Not Answered   Clinical Intake:  Pre-visit preparation completed: Yes  Pain : No/denies pain     Diabetes: Yes CBG done?: No Did pt. bring in CBG monitor from home?: No  How often do you need to have someone help you when you read instructions, pamphlets, or other written materials from your doctor or pharmacy?: 1 - Never  Interpreter Needed?: No  Information entered by :: Mliss Graff LPN   Activities of Daily Living    03/01/2024    2:28 PM  In your present state of health, do you have any  difficulty performing the following activities:  Hearing? 0  Vision? 0  Difficulty concentrating or making decisions? 0  Walking or climbing stairs? 0  Dressing or bathing? 0  Doing errands, shopping? 0  Preparing Food and eating ? N  Using the Toilet? N  In the past six months, have you accidently leaked urine? N  Do you have problems with loss of bowel control? N  Managing your Medications? N  Managing your Finances? N  Housekeeping or managing your Housekeeping? N    Patient Care Team: Levora Reyes SAUNDERS, MD as PCP - General (Family Medicine)  Indicate any recent Medical Services you may have received from other than [facility] providers in the past year (date may be approximate).     Assessment:   This is a routine wellness examination for [place].  Hearing/Vision screen Hearing Screening - Comments:: No trouble hearing Vision Screening - Comments:: Up to date Gabriel Wu   Goals Addressed   None    Depression Screen    03/01/2024    2:29 PM 02/25/2024    1:08 PM 11/25/2023    1:10 PM 08/24/2023    2:53 PM 05/07/2023    3:03 PM 02/04/2023    2:27 PM 12/18/2022    1:57 PM  PHQ 2/9 Scores  PHQ - 2 Score 0 0 0 0 0 0 0  PHQ- 9 Score 0 0 0 0 0 0 0    Fall Risk    03/01/2024    2:43 PM 02/25/2024    1:08 PM 11/25/2023    1:10 PM 08/24/2023    2:52 PM 05/14/2023    2:50 PM  Fall Risk   Falls in the past year? 0 0 0 0 0  Number falls in past yr: 0 0 0 0 0  Injury with Fall? 0 0 0 0 0  Risk for fall due to :  No Fall Risks No Fall Risks No Fall Risks   Follow up Falls evaluation completed;Education provided;Falls prevention discussed Falls evaluation completed Falls evaluation completed Falls evaluation completed     MEDICARE RISK AT HOME: Medicare Risk at Home Any stairs in or around the home?: No If so, are there any without handrails?: No Home free of loose throw rugs in walkways, pet beds, electrical cords, etc?: Yes Adequate lighting in your home to reduce risk of falls?:  Yes Life alert?: No Use of a cane, walker or w/c?: No Grab bars in the bathroom?: Yes Shower chair or bench in shower?: No Elevated toilet seat or a handicapped toilet?: No  TIMED UP AND GO:  Was the test performed?  No    Cognitive Function:        03/01/2024    2:29 PM 08/29/2021    1:23 PM 07/10/2020   10:03 AM 05/31/2019   11:04 AM  6CIT Screen  What Year? 0 points 0 points 0 points   What month? 0 points 0 points 0 points   What time? 0 points 0 points 0 points   Count back from 20 0 points 0 points 0 points 2 points  Months in reverse 2 points 2 points 2 points   Repeat phrase 2 points 0 points 6 points   Total Score 4 points 2 points 8 points     Immunizations Immunization History  Administered Date(s) Administered   Fluad Quad(high Dose 65+) 05/14/2020, 04/21/2022   Fluad Trivalent(High Dose 65+) 05/07/2023   Influenza Split 05/07/2007   Influenza,inj,Quad PF,6+ Mos 04/13/2016   Influenza-Unspecified 05/17/2021   Pneumococcal Conjugate-13 09/10/2017   Pneumococcal Polysaccharide-23 04/13/2016    TDAP status: Due, Education has been provided regarding the importance of this vaccine. Advised may receive this vaccine at local pharmacy or Health Dept. Aware to provide a copy of the vaccination record if obtained from local pharmacy or Health Dept. Verbalized acceptance and understanding.  Flu Vaccine status: Due, Education has been provided regarding the importance of this vaccine. Advised may receive this vaccine at local pharmacy or Health Dept. Aware to provide a copy of the vaccination record if obtained from local pharmacy or Health Dept. Verbalized acceptance  and understanding.  Pneumococcal vaccine status: Up to date  Covid-19 vaccine status: Information provided on how to obtain vaccines.   Qualifies for Shingles Vaccine? Yes   Zostavax completed No   Shingrix Completed?: No.    Education has been provided regarding the importance of this vaccine. Patient  has been advised to call insurance company to determine out of pocket expense if they have not yet received this vaccine. Advised may also receive vaccine at local pharmacy or Health Dept. Verbalized acceptance and understanding.  Screening Tests Health Maintenance  Topic Date Due   DTaP/Tdap/Td (1 - Tdap) Never done   Diabetic kidney evaluation - Urine ACR  09/08/2019   INFLUENZA VACCINE  02/19/2024   OPHTHALMOLOGY EXAM  02/19/2024   FOOT EXAM  08/23/2024   HEMOGLOBIN A1C  08/27/2024   Diabetic kidney evaluation - eGFR measurement  02/24/2025   Medicare Annual Wellness (AWV)  03/01/2025   Pneumococcal Vaccine: 50+ Years  Completed   Hepatitis B Vaccines  Aged Out   HPV VACCINES  Aged Out   Meningococcal B Vaccine  Aged Out   COVID-19 Vaccine  Discontinued   Zoster Vaccines- Shingrix  Discontinued    Health Maintenance  Health Maintenance Due  Topic Date Due   DTaP/Tdap/Td (1 - Tdap) Never done   Diabetic kidney evaluation - Urine ACR  09/08/2019   INFLUENZA VACCINE  02/19/2024   OPHTHALMOLOGY EXAM  02/19/2024    Colorectal cancer screening: No longer required.   Lung Cancer Screening: (Low Dose CT Chest recommended if Age 44-80 years, 20 pack-year currently smoking OR have quit w/in 15years.) does not qualify.   Lung Cancer Screening Referral:   Additional Screening:  Hepatitis C Screening: does not qualify;  Vision Screening: Recommended annual ophthalmology exams for early detection of glaucoma and other disorders of the eye. Is the patient up to date with their annual eye exam?  is scheduled with Gabriel Wu  Who is the provider or what is the name of the office in which the patient attends annual eye exams? Gabriel Wu If pt is not established with a provider, would they like to be referred to a provider to establish care? No .   Dental Screening: Recommended annual dental exams for proper oral hygiene  Nutrition Risk Assessment:  Has the patient had any N/V/D within the  last 2 months?  No  Does the patient have any non-healing wounds?  No  Has the patient had any unintentional weight loss or weight gain?  No   Diabetes:  Is the patient diabetic?  Yes  If diabetic, was a CBG obtained today?  No  Did the patient bring in their glucometer from home?  No  How often do you monitor your CBG's? 1 x a day.   Financial Strains and Diabetes Management:  Are you having any financial strains with the device, your supplies or your medication? No .  Does the patient want to be seen by Chronic Care Management for management of their diabetes?  No  Would the patient like to be referred to a Nutritionist or for Diabetic Management?  No   Diabetic Exams:  Diabetic Eye Exam: . Pt has been advised about the importance in completing this exam  Diabetic Foot Exam: . Pt has been advised about the importance in completing this exam. .    Community Resource Referral / Chronic Care Management: CRR required this visit?  No   CCM required this visit?  No     Plan:  I have personally reviewed and noted the following in the patient's chart:   Medical and social history Use of alcohol, tobacco or illicit drugs  Current medications and supplements including opioid prescriptions. Patient is not currently taking opioid prescriptions. Functional ability and status Nutritional status Physical activity Advanced directives List of other physicians Hospitalizations, surgeries, and ER visits in previous 12 months Vitals Screenings to include cognitive, depression, and falls Referrals and appointments  In addition, I have reviewed and discussed with patient certain preventive protocols, quality metrics, and best practice recommendations. A written personalized care plan for preventive services as well as general preventive health recommendations were provided to patient.     Mliss Graff, LPN   [id number]   After Visit Summary: (MyChart) Due to this being a telephonic  visit, the after visit summary with patients personalized plan was offered to patient via MyChart   Nurse Notes:

## 2024-03-01 NOTE — Patient Instructions (Signed)
 Mr. Gabriel Wu , Thank you for taking time to come for your Medicare Wellness Visit. I appreciate your ongoing commitment to your health goals. Please review the following plan we discussed and let me know if I can assist you in the future.   Screening recommendations/referrals: Colonoscopy: no longer required Recommended yearly ophthalmology/optometry visit for glaucoma screening and checkup Recommended yearly dental visit for hygiene and checkup  Vaccinations: Influenza vaccine: Education provided Pneumococcal vaccine: up to date  Tdap vaccine: up to date      Preventive Care 65 Years and Older, Male Preventive care refers to lifestyle choices and visits with your health care provider that can promote health and wellness. What does preventive care include? A yearly physical exam. This is also called an annual well check. Dental exams once or twice a year. Routine eye exams. Ask your health care provider how often you should have your eyes checked. Personal lifestyle choices, including: Daily care of your teeth and gums. Regular physical activity. Eating a healthy diet. Avoiding tobacco and drug use. Limiting alcohol use. Practicing safe sex. Taking low doses of aspirin  every day. Taking vitamin and mineral supplements as recommended by your health care provider. What happens during an annual well check? The services and screenings done by your health care provider during your annual well check will depend on your age, overall health, lifestyle risk factors, and family history of disease. Counseling  Your health care provider may ask you questions about your: Alcohol use. Tobacco use. Drug use. Emotional well-being. Home and relationship well-being. Sexual activity. Eating habits. History of falls. Memory and ability to understand (cognition). Work and work Astronomer. Screening  You may have the following tests or measurements: Height, weight, and BMI. Blood  pressure. Lipid and cholesterol levels. These may be checked every 5 years, or more frequently if you are over 62 years old. Skin check. Lung cancer screening. You may have this screening every year starting at age 27 if you have a 30-pack-year history of smoking and currently smoke or have quit within the past 15 years. Fecal occult blood test (FOBT) of the stool. You may have this test every year starting at age 28. Flexible sigmoidoscopy or colonoscopy. You may have a sigmoidoscopy every 5 years or a colonoscopy every 10 years starting at age 81. Prostate cancer screening. Recommendations will vary depending on your family history and other risks. Hepatitis C blood test. Hepatitis B blood test. Sexually transmitted disease (STD) testing. Diabetes screening. This is done by checking your blood sugar (glucose) after you have not eaten for a while (fasting). You may have this done every 1-3 years. Abdominal aortic aneurysm (AAA) screening. You may need this if you are a current or former smoker. Osteoporosis. You may be screened starting at age 21 if you are at high risk. Talk with your health care provider about your test results, treatment options, and if necessary, the need for more tests. Vaccines  Your health care provider may recommend certain vaccines, such as: Influenza vaccine. This is recommended every year. Tetanus, diphtheria, and acellular pertussis (Tdap, Td) vaccine. You may need a Td booster every 10 years. Zoster vaccine. You may need this after age 35. Pneumococcal 13-valent conjugate (PCV13) vaccine. One dose is recommended after age 38. Pneumococcal polysaccharide (PPSV23) vaccine. One dose is recommended after age 57. Talk to your health care provider about which screenings and vaccines you need and how often you need them. This information is not intended to replace advice given to  you by your health care provider. Make sure you discuss any questions you have with your  health care provider. Document Released: 08/03/2015 Document Revised: 03/26/2016 Document Reviewed: 05/08/2015 Elsevier Interactive Patient Education  2017 [employer].  Fall Prevention in the Home Falls can cause injuries. They can happen to people of all ages. There are many things you can do to make your home safe and to help prevent falls. What can I do on the outside of my home? Regularly fix the edges of walkways and driveways and fix any cracks. Remove anything that might make you trip as you walk through a door, such as a raised step or threshold. Trim any bushes or trees on the path to your home. Use bright outdoor lighting. Clear any walking paths of anything that might make someone trip, such as rocks or tools. Regularly check to see if handrails are loose or broken. Make sure that both sides of any steps have handrails. Any raised decks and porches should have guardrails on the edges. Have any leaves, snow, or ice cleared regularly. Use sand or salt on walking paths during winter. Clean up any spills in your garage right away. This includes oil or grease spills. What can I do in the bathroom? Use night lights. Install grab bars by the toilet and in the tub and shower. Do not use towel bars as grab bars. Use non-skid mats or decals in the tub or shower. If you need to sit down in the shower, use a plastic, non-slip stool. Keep the floor dry. Clean up any water that spills on the floor as soon as it happens. Remove soap buildup in the tub or shower regularly. Attach bath mats securely with double-sided non-slip rug tape. Do not have throw rugs and other things on the floor that can make you trip. What can I do in the bedroom? Use night lights. Make sure that you have a light by your bed that is easy to reach. Do not use any sheets or blankets that are too big for your bed. They should not hang down onto the floor. Have a firm chair that has side arms. You can use this for  support while you get dressed. Do not have throw rugs and other things on the floor that can make you trip. What can I do in the kitchen? Clean up any spills right away. Avoid walking on wet floors. Keep items that you use a lot in easy-to-reach places. If you need to reach something above you, use a strong step stool that has a grab bar. Keep electrical cords out of the way. Do not use floor polish or wax that makes floors slippery. If you must use wax, use non-skid floor wax. Do not have throw rugs and other things on the floor that can make you trip. What can I do with my stairs? Do not leave any items on the stairs. Make sure that there are handrails on both sides of the stairs and use them. Fix handrails that are broken or loose. Make sure that handrails are as long as the stairways. Check any carpeting to make sure that it is firmly attached to the stairs. Fix any carpet that is loose or worn. Avoid having throw rugs at the top or bottom of the stairs. If you do have throw rugs, attach them to the floor with carpet tape. Make sure that you have a light switch at the top of the stairs and the bottom of the stairs.  If you do not have them, ask someone to add them for you. What else can I do to help prevent falls? Wear shoes that: Do not have high heels. Have rubber bottoms. Are comfortable and fit you well. Are closed at the toe. Do not wear sandals. If you use a stepladder: Make sure that it is fully opened. Do not climb a closed stepladder. Make sure that both sides of the stepladder are locked into place. Ask someone to hold it for you, if possible. Clearly mark and make sure that you can see: Any grab bars or handrails. First and last steps. Where the edge of each step is. Use tools that help you move around (mobility aids) if they are needed. These include: Canes. Walkers. Scooters. Crutches. Turn on the lights when you go into a dark area. Replace any light bulbs as soon  as they burn out. Set up your furniture so you have a clear path. Avoid moving your furniture around. If any of your floors are uneven, fix them. If there are any pets around you, be aware of where they are. Review your medicines with your doctor. Some medicines can make you feel dizzy. This can increase your chance of falling. Ask your doctor what other things that you can do to help prevent falls. This information is not intended to replace advice given to you by your health care provider. Make sure you discuss any questions you have with your health care provider. Document Released: 05/03/2009 Document Revised: 12/13/2015 Document Reviewed: 08/11/2014 Elsevier Interactive Patient Education  2017 [employer].

## 2024-03-03 DIAGNOSIS — H401112 Primary open-angle glaucoma, right eye, moderate stage: Secondary | ICD-10-CM | POA: Diagnosis not present

## 2024-03-03 DIAGNOSIS — H40022 Open angle with borderline findings, high risk, left eye: Secondary | ICD-10-CM | POA: Diagnosis not present

## 2024-03-03 LAB — HM DIABETES EYE EXAM

## 2024-04-28 ENCOUNTER — Telehealth: Payer: Self-pay

## 2024-04-28 NOTE — Telephone Encounter (Signed)
 Gave pt a call,pt is coming up due for reenrollment on AZ&ME Farxiga , left a HIPAA VM, will mail out pt portion and faxed provider portion.

## 2024-05-04 ENCOUNTER — Other Ambulatory Visit: Payer: Self-pay | Admitting: Family Medicine

## 2024-05-04 DIAGNOSIS — E1129 Type 2 diabetes mellitus with other diabetic kidney complication: Secondary | ICD-10-CM

## 2024-05-04 NOTE — Telephone Encounter (Unsigned)
 Copied from CRM [id number]. Topic: Clinical - Medication Refill >> May 04, 2024  2:53 PM Robinson H wrote: Medication: glipiZIDE  (GLUCOTROL ) 5 MG tablet   Has the patient contacted their pharmacy? Yes, states he called pharmacy 5 days ago (Agent: If no, request that the patient contact the pharmacy for the refill. If patient does not wish to contact the pharmacy document the reason why and proceed with request.) (Agent: If yes, when and what did the pharmacy advise?)  This is the patient's preferred pharmacy:  [id number] - [place] ([place]), [place] - [address] [place] ([place]) [place] [address] Phone: [phone] Fax: [phone]    Is this the correct pharmacy for this prescription? Yes If no, delete pharmacy and type the correct one.   Has the prescription been filled recently? No  Is the patient out of the medication? Yes  Has the patient been seen for an appointment in the last year OR does the patient have an upcoming appointment? Yes  Can we respond through MyChart? No  Agent: Please be advised that Rx refills may take up to 3 business days. We ask that you follow-up with your pharmacy.

## 2024-05-05 ENCOUNTER — Other Ambulatory Visit: Payer: Medicare HMO | Admitting: Pharmacist

## 2024-05-05 DIAGNOSIS — E1129 Type 2 diabetes mellitus with other diabetic kidney complication: Secondary | ICD-10-CM

## 2024-05-05 MED ORDER — GLIPIZIDE 5 MG PO TABS
7.5000 mg | ORAL_TABLET | Freq: Every day | ORAL | 1 refills | Status: AC
Start: 2024-05-05 — End: ?

## 2024-05-05 NOTE — Progress Notes (Signed)
 05/05/2024 Name: Gabriel COLAN Sr. MRN: [id number] DOB: 08/14/1940  Chief Complaint  Patient presents with   Medication Management   Diabetes    Gabriel WUERTZ Sr. is a 83 y.o. year old male who presented for a telephone visit.   They were referred to the pharmacist by their PCP for assistance in managing medication access.    Subjective:  Care Team: Primary Care Provider: Levora Reyes SAUNDERS, MD ; Next Scheduled Visit: 05/25/2024 Nephrologist - [facility] - Dr Macel or Lucie Collet, [facility] - last office visit was 10/2023  Medication Access/Adherence  Current Pharmacy:  [facility] [address] ([place]), [place] - [address]  ([place]) [place] [address] Phone: [phone] Fax: [phone]  [facility] - [place], [place] - [address] [place] [address] Phone: [phone] Fax: [phone]   Patient reports affordability concerns with their medications: No  - not since he has [place] getting Farxiga  [place] Patient reports access/transportation concerns to their pharmacy: No  Patient reports adherence concerns with their medications:  No     Patient has CKD 3B and type 2 DM.  Current medication: Farxiga  10mg  daily and  glipizide  5mg  - take 1.5 tablet daily per med list but patient reprots he has been taking 1 tablet twice a day because it was difficult to split the glipizide  tablet  Previously was taking Jardiance  10mg  daily but stopped in 2024 due to cost of >$150. Jardiance  was changed to Farxiga  5mg  daily. Patient [place] receive Farxiga  [place]ru [place]. He endorses he has a bottle of 90 tablets on hand currently.  [place] 2026 [place] was mailed to patient by Med Assist Team on 04/28/2024 but he has not received yet.    Last A1c was was 7.8% on 02/25/2024 down from 8.4%.  Recent blood glucose readings per patient: 130's - he usually checks  blood glucose before his lunch.   Denies signs of hypoglycemia- shakiness, sweating, weakness,  Denies signs of hyperglycemia - polyuria or polydipsia  Lab Results  Component Value Date   NA 137 02/25/2024   CL 99 02/25/2024   K 4.3 02/25/2024   CO2 20 02/25/2024   BUN 20 02/25/2024   CREATININE 1.75 (H) 02/25/2024   GFR 35.76 (L) 02/25/2024   CALCIUM  9.5 02/25/2024   PHOS 1.5 (L) 02/14/2022   ALBUMIN 4.5 02/25/2024   GLUCOSE 133 (H) 02/25/2024    Macrovascular and Microvascular Risk Reduction:  Statin? yes (atorvastatin  40mg ); ACEi/ARB? yes (lisinopril  10mg  + 20mg  for a total dose of 30mg  daily) Last urinary albumin/creatinine ratio - usually checked by Dr Macel at [facility].  Lab Results  Component Value Date   MICRALBCREAT 14 09/07/2018   Last eye exam:  Lab Results  Component Value Date   HMDIABEYEEXA No Retinopathy 03/03/2024   Last foot exam: No foot exam found Tobacco Use:  Tobacco Use: Medium Risk (03/01/2024)   Patient History    Smoking Tobacco Use: Former    Smokeless Tobacco Use: Never    Passive Exposure: Not on file      Objective:  Lab Results  Component Value Date   HGBA1C 7.8 (H) 02/25/2024    Lab Results  Component Value Date   CREATININE 1.75 (H) 02/25/2024   BUN 20 02/25/2024   NA 137 02/25/2024   K 4.3 02/25/2024   CL 99 02/25/2024  CO2 20 02/25/2024    Lab Results  Component Value Date   CHOL 110 02/25/2024   HDL 41.70 02/25/2024   LDLCALC 42 02/25/2024   LDLDIRECT 53.0 10/23/2022   TRIG 131.0 02/25/2024   CHOLHDL 3 02/25/2024    Medications Reviewed Today     Reviewed by Carla Milling, RPH-CPP (Pharmacist) on 05/05/24 at 0925  Med List Status: <None>   Medication Order Taking? Sig Documenting Provider Last Dose Status Informant  aspirin  81 MG EC tablet [id number] Yes Take 1 tablet (81 mg total) by mouth daily. Levora Reyes SAUNDERS, MD  Active   atorvastatin  (LIPITOR) 40 MG tablet [id number] Yes TAKE 1 TABLET BY  MOUTH ONCE DAILY AT  6  PM. Levora Reyes SAUNDERS, MD  Active   blood glucose meter kit and supplies [id number] Yes Dispense based on patient and insurance preference. Use once per day and if symptoms of low blood sugar. (FOR ICD-10 E10.9, E11.9). Levora Reyes SAUNDERS, MD  Active   dapagliflozin  propanediol (FARXIGA ) 10 MG TABS tablet [id number] Yes Take 1 tablet (10 mg total) by mouth daily before breakfast. Levora Reyes SAUNDERS, MD  Active   fluticasone  (FLONASE ) 50 MCG/ACT nasal spray [id number]  Place 2 sprays into both nostrils daily. Levora Reyes SAUNDERS, MD  Active   glipiZIDE  (GLUCOTROL ) 5 MG tablet [id number] Yes Take 1.5 tablets (7.5 mg total) by mouth daily before breakfast.  Patient taking differently: Take 5 mg by mouth 2 (two) times daily before a meal.   Levora Reyes SAUNDERS, MD  Active   glucose blood (ACCU-CHEK GUIDE) test strip [id number] Yes Use to check blood glucose once a day. (DX: E11.9 - type 2 diabetes) Levora Reyes SAUNDERS, MD  Active   lisinopril  (ZESTRIL ) 10 MG tablet [id number] Yes Take 1 tablet (10 mg total) by mouth daily. Add to 20mg  for total dose 30mg  per day. Levora Reyes SAUNDERS, MD  Active   lisinopril  (ZESTRIL ) 20 MG tablet [id number] Yes Take 1 tablet (20 mg total) by mouth daily. Levora Reyes SAUNDERS, MD  Active   omeprazole  (PRILOSEC ) 20 MG capsule [id number] Yes Take 1 capsule (20 mg total) by mouth daily. Levora Reyes SAUNDERS, MD  Active               Assessment/Plan:   Diabetes:Currently not at goal A1c but improving.  - Continue Farxiga  10mg  daily  - Patient will need updated Rx for glipizide  5mg  twice a day - will coordinate with PCP.  - Recommend to continue to check glucose daily - Reviewed home goals  Fasting blood glucose goal (before meals) = 80 to 130 Blood glucose goal after a meal = less than 180   - Meets financial criteria for Farxiga  patient assistance program. [place] thru 07/20/2024. Provided instruction on  completing application once he receives it I the mail. He should receive in the mail by 05/09/2024 - if he dose not he will contact me. - Discussed 2026 changes to his current [facility]. His medication deductibe will be $440 for 2026 (this year it was $250). Most of his medication will be $0 copay with the exception of Farxiga . If he is not approved for medication assistance program, then he will have high cost at first due to deductible but then cost for 90 day supply would be $98.15.    Follow Up Plan: 4 weeks  Milling Carla, PharmD Clinical Pharmacist [facility]  Population Health [phone]

## 2024-05-10 DIAGNOSIS — N1832 Chronic kidney disease, stage 3b: Secondary | ICD-10-CM | POA: Diagnosis not present

## 2024-05-19 DIAGNOSIS — I129 Hypertensive chronic kidney disease with stage 1 through stage 4 chronic kidney disease, or unspecified chronic kidney disease: Secondary | ICD-10-CM | POA: Diagnosis not present

## 2024-05-19 DIAGNOSIS — N1832 Chronic kidney disease, stage 3b: Secondary | ICD-10-CM | POA: Diagnosis not present

## 2024-05-19 DIAGNOSIS — E785 Hyperlipidemia, unspecified: Secondary | ICD-10-CM | POA: Diagnosis not present

## 2024-05-19 DIAGNOSIS — E1122 Type 2 diabetes mellitus with diabetic chronic kidney disease: Secondary | ICD-10-CM | POA: Diagnosis not present

## 2024-05-25 ENCOUNTER — Ambulatory Visit (INDEPENDENT_AMBULATORY_CARE_PROVIDER_SITE_OTHER): Admitting: Family Medicine

## 2024-05-25 VITALS — BP 124/68 | HR 89 | Temp 98.2°F | Resp 16 | Ht 69.0 in | Wt 200.8 lb

## 2024-05-25 DIAGNOSIS — E785 Hyperlipidemia, unspecified: Secondary | ICD-10-CM

## 2024-05-25 DIAGNOSIS — N1832 Chronic kidney disease, stage 3b: Secondary | ICD-10-CM

## 2024-05-25 DIAGNOSIS — E1129 Type 2 diabetes mellitus with other diabetic kidney complication: Secondary | ICD-10-CM | POA: Diagnosis not present

## 2024-05-25 DIAGNOSIS — Z23 Encounter for immunization: Secondary | ICD-10-CM | POA: Diagnosis not present

## 2024-05-25 DIAGNOSIS — Z7984 Long term (current) use of oral hypoglycemic drugs: Secondary | ICD-10-CM

## 2024-05-25 DIAGNOSIS — I10 Essential (primary) hypertension: Secondary | ICD-10-CM | POA: Diagnosis not present

## 2024-05-25 DIAGNOSIS — R809 Proteinuria, unspecified: Secondary | ICD-10-CM

## 2024-05-25 NOTE — Progress Notes (Unsigned)
 Subjective:  Patient ID: Gabriel FORBES Messing Sr., male    DOB: 20-Feb-1941  Age: 83 y.o. MRN: [id number]  CC:  Chief Complaint  Patient presents with   Medical Management of Chronic Issues    Pt is doing well no questions or concerns at check in     HPI Gabriel E Baray Sr. presents for   Diabetes: Complicated by hyperglycemia, microalbuminuria, CKD followed by nephrology, Dr. Macel.  Had appt last week. Treated with glipizide , Farxiga  with medication assistance program, ACE inhibitor with lisinopril , statin with Lipitor.  Glipizide  5 mg twice daily as of his last visit and Farxiga  10 mg in August. No missed doses.  Denies mycotic or UTI symptoms with current meds. Home readings Fasting 137 Postprandial 158.  Microalbumin: Due, he is on ACE inhibitor, nephrology as above. Checking today.  Optho, foot exam, pneumovax:  Flu vaccine today.   Lab Results  Component Value Date   HGBA1C 7.8 (H) 02/25/2024   HGBA1C 8.4 (H) 09/01/2023   HGBA1C 8.2 (H) 05/07/2023   Lab Results  Component Value Date   LDLCALC 42 02/25/2024   CREATININE 1.75 (H) 02/25/2024   Hypertension: Lisinopril  30 mg daily without missed doses or side effects.  CKD as above followed by nephrology - Recent visit - 126 systolic. No med changes. Did have some labs.  BP Readings from Last 3 Encounters:  05/25/24 124/68  02/25/24 (!) 148/76  11/25/23 122/60   Lab Results  Component Value Date   CREATININE 1.75 (H) 02/25/2024    Hyperlipidemia: Lipitor 40 mg daily without new myalgias or side effects. Lab Results  Component Value Date   CHOL 110 02/25/2024   HDL 41.70 02/25/2024   LDLCALC 42 02/25/2024   LDLDIRECT 53.0 10/23/2022   TRIG 131.0 02/25/2024   CHOLHDL 3 02/25/2024   Lab Results  Component Value Date   ALT 23 02/25/2024   AST 18 02/25/2024   ALKPHOS 101 02/25/2024   BILITOT 0.5 02/25/2024        History Patient Active Problem List   Diagnosis Date Noted   DM (diabetes  mellitus) (HCC) 05/12/2016   Hyperlipemia    Chest pain 04/12/2016   Essential hypertension 04/12/2016   Dyspepsia 04/12/2016   Past Medical History:  Diagnosis Date   Hypertension    Urolithiasis    Past stone spontaneously per patient.   No past surgical history on file. No Known Allergies Prior to Admission medications   Medication Sig Start Date End Date Taking? Authorizing Provider  aspirin  81 MG EC tablet Take 1 tablet (81 mg total) by mouth daily. 08/29/19  Yes Levora Reyes SAUNDERS, MD  atorvastatin  (LIPITOR) 40 MG tablet TAKE 1 TABLET BY MOUTH ONCE DAILY AT  6  PM. 11/25/23  Yes Levora Reyes SAUNDERS, MD  blood glucose meter kit and supplies Dispense based on patient and insurance preference. Use once per day and if symptoms of low blood sugar. (FOR ICD-10 E10.9, E11.9). 01/01/22  Yes Levora Reyes SAUNDERS, MD  dapagliflozin  propanediol (FARXIGA ) 10 MG TABS tablet Take 1 tablet (10 mg total) by mouth daily before breakfast. 01/11/24  Yes Levora Reyes SAUNDERS, MD  fluticasone  (FLONASE ) 50 MCG/ACT nasal spray Place 2 sprays into both nostrils daily. 04/21/22  Yes Levora Reyes SAUNDERS, MD  glipiZIDE  (GLUCOTROL ) 5 MG tablet Take 1.5 tablets (7.5 mg total) by mouth daily before breakfast. Patient taking differently: Take 5 mg by mouth 2 (two) times daily before a meal. 05/05/24  Yes Levora Reyes SAUNDERS, MD  glucose blood (ACCU-CHEK GUIDE) test strip Use to check blood glucose once a day. (DX: E11.9 - type 2 diabetes) 05/21/23  Yes Levora Reyes SAUNDERS, MD  lisinopril  (ZESTRIL ) 10 MG tablet Take 1 tablet (10 mg total) by mouth daily. Add to 20mg  for total dose 30mg  per day. 02/25/24  Yes Levora Reyes SAUNDERS, MD  lisinopril  (ZESTRIL ) 20 MG tablet Take 1 tablet (20 mg total) by mouth daily. 02/25/24  Yes Levora Reyes SAUNDERS, MD  omeprazole  (PRILOSEC ) 20 MG capsule Take 1 capsule (20 mg total) by mouth daily. 02/25/24  Yes Levora Reyes SAUNDERS, MD   Social History   Socioeconomic History   Marital status: Single    Spouse  name: Not on file   Number of children: 2   Years of education: Not on file   Highest education level: Not on file  Occupational History   Occupation: retired  Tobacco Use   Smoking status: Former    Types: Cigars    Quit date: 07/21/2010    Years since quitting: 13.8   Smokeless tobacco: Never  Vaping Use   Vaping status: Never Used  Substance and Sexual Activity   Alcohol use: No   Drug use: No   Sexual activity: Never  Other Topics Concern   Not on file  Social History Narrative   Lives alone - sons live in [place]   Social Drivers of Health   Financial Resource Strain: Low Risk  (03/01/2024)   Overall Financial Resource Strain (CARDIA)    Difficulty of Paying Living Expenses: Not hard at all  Food Insecurity: No Food Insecurity (03/01/2024)   Hunger Vital Sign    Worried About Running Out of Food in the Last Year: Never true    Ran Out of Food in the Last Year: Never true  Transportation Needs: No Transportation Needs (03/01/2024)   PRAPARE - Administrator, Civil Service (Medical): No    Lack of Transportation (Non-Medical): No  Physical Activity: Inactive (03/01/2024)   Exercise Vital Sign    Days of Exercise per Week: 0 days    Minutes of Exercise per Session: 0 min  Stress: No Stress Concern Present (03/01/2024)   [employer] of Occupational Health - Occupational Stress Questionnaire    Feeling of Stress: Not at all  Social Connections: Socially Isolated (03/01/2024)   Social Connection and Isolation Panel    Frequency of Communication with Friends and Family: More than three times a week    Frequency of Social Gatherings with Friends and Family: More than three times a week    Attends Religious Services: Never    Database Administrator or Organizations: No    Attends Banker Meetings: Never    Marital Status: Widowed  Intimate Partner Violence: Not At Risk (03/01/2024)   Humiliation, Afraid, Rape, and Kick questionnaire    Fear of  Current or Ex-Partner: No    Emotionally Abused: No    Physically Abused: No    Sexually Abused: No    Review of Systems  Constitutional:  Negative for fatigue and unexpected weight change.  Eyes:  Negative for visual disturbance.  Respiratory:  Negative for cough, chest tightness and shortness of breath.   Cardiovascular:  Negative for chest pain, palpitations and leg swelling.  Gastrointestinal:  Negative for abdominal pain and blood in stool.  Neurological:  Negative for dizziness, light-headedness and headaches.     Objective:   Vitals:   05/25/24 1351  BP: 124/68  Pulse:  89  Resp: 16  Temp: 98.2 F (36.8 C)  TempSrc: Temporal  SpO2: 96%  Weight: 200 lb 12.8 oz (91.1 kg)  Height: 5' 9 (1.753 m)     Physical Exam Vitals reviewed.  Constitutional:      Appearance: He is well-developed.  HENT:     Head: Normocephalic and atraumatic.  Neck:     Vascular: No carotid bruit or JVD.  Cardiovascular:     Rate and Rhythm: Normal rate and regular rhythm.     Heart sounds: Normal heart sounds. No murmur heard. Pulmonary:     Effort: Pulmonary effort is normal.     Breath sounds: Normal breath sounds. No rales.  Musculoskeletal:     Right lower leg: No edema.     Left lower leg: No edema.  Skin:    General: Skin is warm and dry.  Neurological:     Mental Status: He is alert and oriented to person, place, and time.  Psychiatric:        Mood and Affect: Mood normal.        Assessment & Plan:  COLDEN SAMARAS Sr. is a 83 y.o. male . Type 2 diabetes mellitus with microalbuminuria, without long-term current use of insulin (HCC) - Plan: Microalbumin / creatinine urine ratio  Hyperlipidemia, unspecified hyperlipidemia type  Stage 3b chronic kidney disease (HCC)  Essential hypertension Tolerating current med regimen for hypertension and hyperlipidemia with stable blood pressure at this time.  Last lipids overall looked okay.  Recently saw nephrology and lab  work was performed.  I will try to request those records to not duplicate labs.  If he has not had A1c, can schedule as a lab only visit in the next 1 week.  No medication changes at this time.  Flu vaccine given, check urine microalbumin.  47-month follow-up.  No orders of the defined types were placed in this encounter.  Patient Instructions  Thanks for coming in today.  We will request the records from nephrology and determine which labs they did check recently.  If they did not check your 16-month blood sugar test or hemoglobin A1c we can schedule a lab visit in the next week to have that test performed.  No med changes at this time.  I will see you in 3 months but let me know if there are any questions in the meantime.    Signed,   Reyes Pines, MD [facility], [facility] 05/25/24 2:22 PM

## 2024-05-25 NOTE — Patient Instructions (Signed)
 Thanks for coming in today.  We will request the records from nephrology and determine which labs they did check recently.  If they did not check your 36-month blood sugar test or hemoglobin A1c we can schedule a lab visit in the next week to have that test performed.  No med changes at this time.  I will see you in 3 months but let me know if there are any questions in the meantime.

## 2024-05-26 ENCOUNTER — Ambulatory Visit: Admitting: Family Medicine

## 2024-05-26 ENCOUNTER — Encounter: Payer: Self-pay | Admitting: Family Medicine

## 2024-05-26 LAB — MICROALBUMIN / CREATININE URINE RATIO
Creatinine,U: 114.8 mg/dL
Microalb Creat Ratio: 20.3 mg/g (ref 0.0–30.0)
Microalb, Ur: 2.3 mg/dL — ABNORMAL HIGH (ref 0.0–1.9)

## 2024-06-02 ENCOUNTER — Other Ambulatory Visit: Payer: Self-pay | Admitting: Pharmacist

## 2024-06-02 ENCOUNTER — Telehealth: Payer: Self-pay | Admitting: Pharmacist

## 2024-06-02 NOTE — Progress Notes (Signed)
 Attempt was made to contact patient by phone today for follow up by Clinical Pharmacist regarding diabetes and medication assistance program for Farxiga .  Unable to reach patient. LM on VM with my contact number [phone].

## 2024-06-03 ENCOUNTER — Ambulatory Visit: Payer: Self-pay | Admitting: Family Medicine

## 2024-06-03 NOTE — Telephone Encounter (Signed)
 Copied from CRM [id number]. Topic: Clinical - Lab/Test Results >> Jun 03, 2024  2:49 PM Macario HERO wrote: Reason for CRM: Patient returned call regarding lab results. Relayed message.

## 2024-06-20 ENCOUNTER — Telehealth: Payer: Self-pay | Admitting: Family Medicine

## 2024-06-20 NOTE — Telephone Encounter (Signed)
 Type of form received: Chronic Condition Verification Form  Additional comments:   Received by: Fax  Form should be Faxed/mailed to: (address/ fax #) [phone]  Is patient requesting call for pickup: N/A  Form placed:  Labeled & placed in provider bin  Attach charge sheet.  Provider will determine charge.  Individual made aware of 3-5 business day turn around? N/A

## 2024-06-20 NOTE — Telephone Encounter (Signed)
 Placed in providers sign folder.

## 2024-06-20 NOTE — Telephone Encounter (Signed)
 Paperwork completed and placed in fax bin at back nurse station

## 2024-06-21 ENCOUNTER — Other Ambulatory Visit: Payer: Self-pay | Admitting: Family Medicine

## 2024-06-21 DIAGNOSIS — K219 Gastro-esophageal reflux disease without esophagitis: Secondary | ICD-10-CM

## 2024-06-21 NOTE — Telephone Encounter (Signed)
Faxed and scanned documents.

## 2024-07-05 LAB — OPHTHALMOLOGY REPORT-SCANNED

## 2024-07-19 ENCOUNTER — Telehealth: Payer: Self-pay | Admitting: Pharmacist

## 2024-07-19 NOTE — Telephone Encounter (Signed)
 Tried to reach patient to see if he has completed application for Farxiga  for 2026. Unable to reach patient.  [place] did contact [place] and [place] has re-en[place]ment pending.  Forwarding to Med Assist Team to follow up (application was mailed to patient per notes 04/28/2024)

## 2024-07-19 NOTE — Telephone Encounter (Signed)
 Patient returned my call. He did complete the [place] but has not returned it to med assist team or PCP's office yet.  He plans to drop it by the office soon.  He endorses that he has 3 months of Farxiga  on hand.

## 2024-07-19 NOTE — Telephone Encounter (Signed)
 Call pt 2nd attemp pt did not answer left a HIPAA VM ,also mail out pt portion have not received back.

## 2024-08-22 ENCOUNTER — Other Ambulatory Visit: Payer: Self-pay | Admitting: Family Medicine

## 2024-08-22 DIAGNOSIS — I10 Essential (primary) hypertension: Secondary | ICD-10-CM

## 2024-08-23 ENCOUNTER — Telehealth: Payer: Self-pay | Admitting: Pharmacist

## 2024-08-23 DIAGNOSIS — E1129 Type 2 diabetes mellitus with other diabetic kidney complication: Secondary | ICD-10-CM

## 2024-08-24 MED ORDER — DAPAGLIFLOZIN PROPANEDIOL 10 MG PO TABS: 10.0000 mg | ORAL_TABLET | Freq: Every day | ORAL | 2 refills | Status: AC

## 2024-08-25 ENCOUNTER — Ambulatory Visit: Admitting: Family Medicine

## 2024-09-08 ENCOUNTER — Other Ambulatory Visit: Admitting: Pharmacist

## 2024-09-26 ENCOUNTER — Ambulatory Visit: Admitting: Family Medicine

## 2025-03-07 ENCOUNTER — Encounter
